# Patient Record
Sex: Female | Born: 1960
Health system: Southern US, Community
[De-identification: ages and names within clinical notes are randomized; demographics above are authoritative.]

## PROBLEM LIST (undated history)

## (undated) DIAGNOSIS — R5383 Other fatigue: Secondary | ICD-10-CM

## (undated) DIAGNOSIS — R002 Palpitations: Secondary | ICD-10-CM

## (undated) DIAGNOSIS — G4700 Insomnia, unspecified: Secondary | ICD-10-CM

## (undated) DIAGNOSIS — F99 Mental disorder, not otherwise specified: Secondary | ICD-10-CM

## (undated) DIAGNOSIS — K219 Gastro-esophageal reflux disease without esophagitis: Secondary | ICD-10-CM

## (undated) DIAGNOSIS — H8110 Benign paroxysmal vertigo, unspecified ear: Secondary | ICD-10-CM

## (undated) DIAGNOSIS — R609 Edema, unspecified: Secondary | ICD-10-CM

## (undated) DIAGNOSIS — I10 Essential (primary) hypertension: Secondary | ICD-10-CM

## (undated) DIAGNOSIS — R42 Dizziness and giddiness: Secondary | ICD-10-CM

## (undated) DIAGNOSIS — K449 Diaphragmatic hernia without obstruction or gangrene: Secondary | ICD-10-CM

## (undated) HISTORY — DX: Mental disorder, not otherwise specified: F99

## (undated) HISTORY — DX: Other fatigue: R53.83

## (undated) HISTORY — PX: BREAST EXCISIONAL BIOPSY: SUR124

## (undated) HISTORY — DX: Insomnia, unspecified: G47.00

## (undated) HISTORY — DX: Essential (primary) hypertension: I10

## (undated) HISTORY — DX: Edema, unspecified: R60.9

## (undated) HISTORY — DX: Benign paroxysmal vertigo, unspecified ear: H81.10

## (undated) HISTORY — DX: Gastro-esophageal reflux disease without esophagitis: K21.9

## (undated) HISTORY — DX: Dizziness and giddiness: R42

## (undated) HISTORY — DX: Diaphragmatic hernia without obstruction or gangrene: K44.9

## (undated) HISTORY — DX: Palpitations: R00.2

## (undated) HISTORY — PX: BREAST BIOPSY: SHX20

## (undated) HISTORY — PX: ESOPHAGOGASTRODUODENOSCOPY: SHX1529

---

## 1985-01-19 HISTORY — PX: CHOLECYSTECTOMY: SHX55

## 1998-01-19 HISTORY — PX: BREAST EXCISIONAL BIOPSY: SUR124

## 1998-01-19 HISTORY — PX: BREAST LUMPECTOMY: SHX2

## 2006-09-20 LAB — CONVERTED CEMR LAB

## 2006-09-30 HISTORY — PX: ENDOMETRIAL ABLATION: SHX621

## 2006-12-09 ENCOUNTER — Ambulatory Visit: Payer: Self-pay | Admitting: Family Medicine

## 2006-12-09 DIAGNOSIS — E669 Obesity, unspecified: Secondary | ICD-10-CM | POA: Insufficient documentation

## 2006-12-09 DIAGNOSIS — R42 Dizziness and giddiness: Secondary | ICD-10-CM | POA: Insufficient documentation

## 2006-12-09 DIAGNOSIS — D509 Iron deficiency anemia, unspecified: Secondary | ICD-10-CM | POA: Insufficient documentation

## 2006-12-09 DIAGNOSIS — R002 Palpitations: Secondary | ICD-10-CM | POA: Insufficient documentation

## 2006-12-09 DIAGNOSIS — G43909 Migraine, unspecified, not intractable, without status migrainosus: Secondary | ICD-10-CM | POA: Insufficient documentation

## 2006-12-20 ENCOUNTER — Encounter: Payer: Self-pay | Admitting: Family Medicine

## 2006-12-21 LAB — CONVERTED CEMR LAB
AST: 21 units/L (ref 0–37)
Albumin: 4.5 g/dL (ref 3.5–5.2)
BUN: 9 mg/dL (ref 6–23)
Basophils Relative: 0 % (ref 0–1)
CO2: 20 meq/L (ref 19–32)
Creatinine, Ser: 0.68 mg/dL (ref 0.40–1.20)
Eosinophils Absolute: 0.2 10*3/uL (ref 0.2–0.7)
Eosinophils Relative: 2 % (ref 0–5)
Free T4: 1.1 ng/dL (ref 0.89–1.80)
Glucose, Bld: 97 mg/dL (ref 70–99)
Hemoglobin: 13.5 g/dL (ref 12.0–15.0)
Iron: 49 ug/dL (ref 42–145)
Lymphocytes Relative: 22 % (ref 12–46)
Lymphs Abs: 2.3 10*3/uL (ref 0.7–4.0)
Monocytes Absolute: 0.8 10*3/uL (ref 0.1–1.0)
Monocytes Relative: 8 % (ref 3–12)
Neutro Abs: 7.1 10*3/uL (ref 1.7–7.7)
Neutrophils Relative %: 68 % (ref 43–77)
Potassium: 4.3 meq/L (ref 3.5–5.3)
RDW: 14.3 % (ref 11.5–15.5)
Sed Rate: 16 mm/hr (ref 0–22)
TIBC: 263 ug/dL (ref 250–470)
Total Protein: 7.4 g/dL (ref 6.0–8.3)

## 2007-01-25 ENCOUNTER — Telehealth: Payer: Self-pay | Admitting: Family Medicine

## 2007-04-13 ENCOUNTER — Ambulatory Visit: Payer: Self-pay | Admitting: Family Medicine

## 2007-04-13 DIAGNOSIS — R5383 Other fatigue: Secondary | ICD-10-CM

## 2007-04-13 DIAGNOSIS — R5381 Other malaise: Secondary | ICD-10-CM | POA: Insufficient documentation

## 2007-04-14 ENCOUNTER — Encounter: Payer: Self-pay | Admitting: Family Medicine

## 2007-04-15 LAB — CONVERTED CEMR LAB
Saturation Ratios: 17 % — ABNORMAL LOW (ref 20–55)
TIBC: 279 ug/dL (ref 250–470)
UIBC: 232 ug/dL
Vit D, 1,25-Dihydroxy: 15 — ABNORMAL LOW (ref 30–89)

## 2007-05-10 ENCOUNTER — Telehealth: Payer: Self-pay | Admitting: Family Medicine

## 2007-10-12 ENCOUNTER — Ambulatory Visit: Payer: Self-pay | Admitting: Occupational Medicine

## 2007-11-04 ENCOUNTER — Ambulatory Visit: Payer: Self-pay | Admitting: Occupational Medicine

## 2007-11-04 ENCOUNTER — Ambulatory Visit: Payer: Self-pay | Admitting: Family Medicine

## 2007-11-08 ENCOUNTER — Encounter (INDEPENDENT_AMBULATORY_CARE_PROVIDER_SITE_OTHER): Payer: Self-pay | Admitting: Occupational Medicine

## 2007-11-24 ENCOUNTER — Encounter: Admission: RE | Admit: 2007-11-24 | Discharge: 2007-11-24 | Payer: Self-pay | Admitting: Sports Medicine

## 2008-01-28 ENCOUNTER — Encounter: Payer: Self-pay | Admitting: Family Medicine

## 2008-02-13 ENCOUNTER — Ambulatory Visit: Payer: Self-pay | Admitting: Family Medicine

## 2008-02-13 LAB — CONVERTED CEMR LAB
Protein, U semiquant: 30
Specific Gravity, Urine: >=1.03

## 2008-02-14 ENCOUNTER — Encounter: Payer: Self-pay | Admitting: Family Medicine

## 2008-02-14 ENCOUNTER — Telehealth: Payer: Self-pay | Admitting: Family Medicine

## 2008-02-15 LAB — CONVERTED CEMR LAB
Iron: 30 ug/dL — ABNORMAL LOW (ref 42–145)
TSH: 2.597 microintl units/mL (ref 0.350–4.50)
UIBC: 243 ug/dL

## 2008-02-22 ENCOUNTER — Encounter: Payer: Self-pay | Admitting: Family Medicine

## 2008-02-27 ENCOUNTER — Encounter: Payer: Self-pay | Admitting: Family Medicine

## 2008-02-29 LAB — CONVERTED CEMR LAB
Alkaline Phosphatase: 71 units/L (ref 39–117)
Basophils Absolute: 0 10*3/uL (ref 0.0–0.1)
Eosinophils Relative: 2 % (ref 0–5)
MCHC: 33.6 g/dL (ref 30.0–36.0)
Monocytes Absolute: 0.6 10*3/uL (ref 0.1–1.0)
Monocytes Relative: 7 % (ref 3–12)
Neutro Abs: 6.2 10*3/uL (ref 1.7–7.7)
Neutrophils Relative %: 69 % (ref 43–77)
Platelets: 389 10*3/uL (ref 150–400)
RBC: 4.54 M/uL (ref 3.87–5.11)
RDW: 13.9 % (ref 11.5–15.5)

## 2008-03-09 ENCOUNTER — Encounter: Payer: Self-pay | Admitting: Family Medicine

## 2008-03-15 ENCOUNTER — Encounter: Payer: Self-pay | Admitting: Family Medicine

## 2008-04-02 ENCOUNTER — Encounter: Payer: Self-pay | Admitting: Family Medicine

## 2008-04-16 ENCOUNTER — Encounter: Payer: Self-pay | Admitting: Family Medicine

## 2008-04-23 ENCOUNTER — Encounter: Payer: Self-pay | Admitting: Family Medicine

## 2008-04-23 DIAGNOSIS — K449 Diaphragmatic hernia without obstruction or gangrene: Secondary | ICD-10-CM | POA: Insufficient documentation

## 2008-07-27 ENCOUNTER — Encounter: Payer: Self-pay | Admitting: Family Medicine

## 2008-09-27 HISTORY — PX: TOTAL VAGINAL HYSTERECTOMY: SHX2548

## 2009-03-11 ENCOUNTER — Ambulatory Visit: Payer: Self-pay | Admitting: Family Medicine

## 2009-03-11 DIAGNOSIS — I10 Essential (primary) hypertension: Secondary | ICD-10-CM | POA: Insufficient documentation

## 2009-03-11 DIAGNOSIS — G4709 Other insomnia: Secondary | ICD-10-CM | POA: Insufficient documentation

## 2009-03-11 DIAGNOSIS — G4700 Insomnia, unspecified: Secondary | ICD-10-CM

## 2009-03-11 LAB — CONVERTED CEMR LAB
Cholesterol, target level: 200 mg/dL
LDL Goal: 130 mg/dL

## 2009-03-15 ENCOUNTER — Ambulatory Visit: Payer: Self-pay | Admitting: Family Medicine

## 2009-03-18 ENCOUNTER — Ambulatory Visit: Payer: Self-pay | Admitting: Family Medicine

## 2009-06-11 ENCOUNTER — Encounter: Payer: Self-pay | Admitting: Family Medicine

## 2009-10-17 ENCOUNTER — Encounter: Payer: Self-pay | Admitting: Family Medicine

## 2009-10-31 ENCOUNTER — Encounter: Payer: Self-pay | Admitting: Family Medicine

## 2010-01-15 ENCOUNTER — Ambulatory Visit
Admission: RE | Admit: 2010-01-15 | Discharge: 2010-01-15 | Payer: Self-pay | Source: Home / Self Care | Attending: Family Medicine | Admitting: Family Medicine

## 2010-01-15 ENCOUNTER — Encounter: Payer: Self-pay | Admitting: Family Medicine

## 2010-01-15 DIAGNOSIS — H8110 Benign paroxysmal vertigo, unspecified ear: Secondary | ICD-10-CM | POA: Insufficient documentation

## 2010-01-16 ENCOUNTER — Encounter: Payer: Self-pay | Admitting: Family Medicine

## 2010-01-17 ENCOUNTER — Encounter: Payer: Self-pay | Admitting: Family Medicine

## 2010-02-18 NOTE — Assessment & Plan Note (Signed)
Summary: BP CHECK/  Nurse Visit   Vital Signs:  Patient profile:   50 year old female Pulse rate:   70 / minute BP sitting:   134 / 79  (left arm) Cuff size:   large  Vitals Entered By: Kathlene November (March 18, 2009 10:16 AM)   Impression & Recommendations:  Problem # 1:  HYPERTENSION, BENIGN (ICD-401.1)  Her updated medication list for this problem includes:    Lisinopril-hydrochlorothiazide 10-12.5 Mg Tabs (Lisinopril-hydrochlorothiazide) .Marland Kitchen... Take 1 tablet by mouth once a day in the am.  Complete Medication List: 1)  Aciphex 20 Mg Tbec (Rabeprazole sodium) .... Take 1 tablet by mouth two times a day 2)  Effexor Xr 75 Mg Cp24 (Venlafaxine hcl) .... Take 2  tablet by mouth once a day 3)  Maxalt 10 Mg Tabs (Rizatriptan benzoate) .... Use as needed for h/a 4)  Lisinopril-hydrochlorothiazide 10-12.5 Mg Tabs (Lisinopril-hydrochlorothiazide) .... Take 1 tablet by mouth once a day in the am. 5)  Vitamin D 84132 Unit Caps (Ergocalciferol) .... One a week by mouth for 6 months 6)  Tandem 162-115.2 Mg Caps (Ferrous fum-iron polysacch) .... Take 1 tablet by mouth two times a day. take with colace to help prevent constipation. 7)  Amoxicillin 500 Mg Cap (Amoxicillin) .... Take 1 capsule by mouth three times a day x 10 days 8)  Zolpidem Tartrate 10 Mg Tabs (Zolpidem tartrate) .... Take 1 tablet by mouth once a day at bedtime 9)  Vicodin 5-500 Mg Tabs (Hydrocodone-acetaminophen) .... Take one-two tablets by mouth as needed for pain 10)  Clindamycin Hcl 300 Mg Caps (Clindamycin hcl) .... One by mouth every 6 hours for 10 days.  CC: Follow-up HTN HPI: Taking Meds?yes Side Effects?no Chest Pain, SOB, Dizziness?no A/P: HTN(401.1) At Goal? yes.  If no, MD will be notified. Should be OK for tooth extractions today.   5 minutes was spent with the pt.   Allergies: No Known Drug Allergies  Orders Added: 1)  Est. Patient Level I [44010]

## 2010-02-18 NOTE — Assessment & Plan Note (Signed)
Summary: HTN, sinusitis, insomnia   Vital Signs:  Patient profile:   50 year old female Height:      62 inches Weight:      202 pounds BMI:     37.08 Pulse rate:   73 / minute BP sitting:   141 / 90  (left arm) Cuff size:   large  Vitals Entered By: Kathlene November (March 11, 2009 10:12 AM) CC: followup BP- also has insomnia sleeps 1-2 hours a night- sinus infection, Lipid Management, Hypertension Management   Primary Care Provider:  Nani Gasser MD  CC:  followup BP- also has insomnia sleeps 1-2 hours a night- sinus infection, Lipid Management, and Hypertension Management.  History of Present Illness: followup BP- also has insomnia sleeps 1-2 hours a night- sinus infection.    Right ear is echoing adn having sinus pressure behind both eyes.  Lotsof congestion.  Making her migrianes worse. FEver Thurs and friday. Having night sweats. Started about 5 days ago.  No ST.  Tried some Advil Sinus- no relief.  Now getting light sensitivity. Pain in her ear actually kept her up one night.   Insomnia for about 2 mo. Tried several OTC products. Will on ly sleep 1-2 hours at a time. Hard time falling asleep and stayin asleep.  Thinks getting 3-4 hours total at night.  Has reduced her caffeine.  No major stressors. Ne time had tried [place] in the past- worked well. Does have migraine and has had to use her  all of her tryptas.   Hypertension History:      She denies headache, chest pain, palpitations, dyspnea with exertion, orthopnea, PND, peripheral edema, visual symptoms, neurologic problems, syncope, and side effects from treatment.  She notes no problems with any antihypertensive medication side effects.        Positive major cardiovascular risk factors include hypertension and family history for ischemic heart disease (females less than 4 years old).  Negative major cardiovascular risk factors include female age less than 91 years old and non-tobacco-user status.    Lipid Management  History:      Positive NCEP/ATP III risk factors include family history for ischemic heart disease (females less than 47 years old) and hypertension.  Negative NCEP/ATP III risk factors include female age less than 75 years old and non-tobacco-user status.     Current Medications (verified): 1)  Aciphex 20 Mg Tbec (Rabeprazole Sodium) .... Take 1 Tablet By Mouth Two Times A Day 2)  Effexor Xr 75 Mg  Cp24 (Venlafaxine Hcl) .... Take 2  Tablet By Mouth Once A Day 3)  Maxalt 10 Mg  Tabs (Rizatriptan Benzoate) .... Use As Needed For H/a 4)  Hydrochlorothiazide 12.5 Mg  Caps (Hydrochlorothiazide) .... Take 1 Tablet By Mouth Once A Day 5)  Vitamin D 10272 Unit  Caps (Ergocalciferol) .... One A Week By Mouth For 6 Months 6)  Tandem 162-115.2 Mg  Caps (Ferrous Fum-Iron Polysacch) .... Take 1 Tablet By Mouth Two Times A Day. Take With Colace To Help Prevent Constipation.  Allergies (verified): No Known Drug Allergies  Comments:  Nurse/Medical Assistant: The patient's medications and allergies were reviewed with the patient and were updated in the Medication and Allergy Lists. Kathlene November (March 11, 2009 10:13 AM)  Social History: Reviewed history from 12/09/2006 and no changes required. 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 Completed 10th grade. Married to [place] with 2 adult children.  Never Smoked Alcohol use-no Drug use-no Regular exercise-no  Physical Exam  General:  Well-developed,well-nourished,in no acute distress; alert,appropriate and cooperative throughout examination Head:  Normocephalic and atraumatic without obvious abnormalities. No apparent alopecia or balding. Eyes:  No corneal or conjunctival inflammation noted. EOMI. Perrla.  Ears:  External ear exam shows no significant lesions or deformities.  Otoscopic examination reveals clear canals, tympanic membranes are intact bilaterally without bulging, retraction, inflammation or discharge. Hearing is grossly normal bilaterally. Nose:   External nasal examination shows no deformity or inflammation. Nasal mucosa are pink and moist without lesions or exudates. Mouth:  OP is mildy ertythematou.  Neck:  No deformities, masses, or tenderness noted. firm right anterior LN about 1 cm.   Lungs:  Normal respiratory effort, chest expands symmetrically. Lungs are clear to auscultation, no crackles or wheezes. Heart:  Normal rate and regular rhythm. S1 and S2 normal without gallop, murmur, click, rub or other extra sounds. Pulses:  Radial 2+  Neurologic:  alert & oriented X3.   Skin:  no rashes.   Cervical Nodes:  No lymphadenopathy noted Psych:  Cognition and judgment appear intact. Alert and cooperative with normal attention span and concentration. No apparent delusions, illusions, hallucinations   Impression & Recommendations:  Problem # 1:  HYPERTENSION, BENIGN (ICD-401.1) Assessment Deteriorated nOT WELL CONTROLLED.  Add ACEi to her regimen. Check BMET to check potassium and C at F/u OVr.  Make sure well controll.d Her updated medication list for this problem includes:    Lisinopril-hydrochlorothiazide 10-12.5 Mg Tabs (Lisinopril-hydrochlorothiazide) .Marland Kitchen... Take 1 tablet by mouth once a day in the am.  Problem # 2:  SINUSITIS, ACUTE (ICD-461.9) Assessment: New  The following medications were removed from the medication list:    Flonase 50 Mcg/act Susp (Fluticasone propionate) .Marland Kitchen... 2 sprays in each nostril daily Her updated medication list for this problem includes:    Amoxicillin 500 Mg Cap (Amoxicillin) .Marland Kitchen... Take 1 capsule by mouth three times a day x 10 days  Instructed on treatment. Call if symptoms persist or worsen.   Problem # 3:  INSOMNIA (ICD-780.52) Assessment: New  Reviewed sleep hygiene. Did well on Ambien in the past so will retry. Discussed goal is to use short term to reset her sleep clock and to not use chroncially. This is clearly affected the frequency of her migraines. Call if any SE or problems.     Her updated medication list for this problem includes:    Zolpidem Tartrate 10 Mg Tabs (Zolpidem tartrate) .Marland Kitchen... Take 1 tablet by mouth once a day at bedtime  Complete Medication List: 1)  Aciphex 20 Mg Tbec (Rabeprazole sodium) .... Take 1 tablet by mouth two times a day 2)  Effexor Xr 75 Mg Cp24 (Venlafaxine hcl) .... Take 2  tablet by mouth once a day 3)  Maxalt 10 Mg Tabs (Rizatriptan benzoate) .... Use as needed for h/a 4)  Lisinopril-hydrochlorothiazide 10-12.5 Mg Tabs (Lisinopril-hydrochlorothiazide) .... Take 1 tablet by mouth once a day in the am. 5)  Vitamin D 32992 Unit Caps (Ergocalciferol) .... One a week by mouth for 6 months 6)  Tandem 162-115.2 Mg Caps (Ferrous fum-iron polysacch) .... Take 1 tablet by mouth two times a day. take with colace to help prevent constipation. 7)  Amoxicillin 500 Mg Cap (Amoxicillin) .... Take 1 capsule by mouth three times a day x 10 days 8)  Zolpidem Tartrate 10 Mg Tabs (Zolpidem tartrate) .... Take 1 tablet by mouth once a day at bedtime  Hypertension Assessment/Plan:      The patient's hypertensive risk group is category B: At least  one risk factor (excluding diabetes) with no target organ damage.  Today's blood pressure is 141/90.  Her blood pressure goal is < 140/90.  Lipid Assessment/Plan:      Based on NCEP/ATP III, the patient's risk factor category is "2 or more risk factors and a calculated 10 year CAD risk of > 20%".  The patient's lipid goals are as follows: Total cholesterol goal is 200; LDL cholesterol goal is 130; HDL cholesterol goal is 40; Triglyceride goal is 150.      Patient Instructions: 1)  F/U in early to mid April to follow up for Blood pressure.  Prescriptions: TANDEM 162-115.2 MG  CAPS (FERROUS FUM-IRON POLYSACCH) Take 1 tablet by mouth two times a day. Take with colace to help prevent constipation.  #60 x 5   Entered and Authorized by:   Nani Gasser MD   Signed by:   Nani Gasser MD on 03/11/2009    Method used:   [facility] to        [facility] [phone]* (retail)       [address] [place], [place]  [address]       Ph: [phone] or [phone]       Fax: [phone]   RxID:   [id number] ACIPHEX 20 MG TBEC (RABEPRAZOLE SODIUM) Take 1 tablet by mouth two times a day  #30 x 5   Entered and Authorized by:   Nani Gasser MD   Signed by:   Nani Gasser MD on 03/11/2009   Method used:   [facility]ronically to        [facility] [phone]* (retail)       [address] [place], [place]  [address]       Ph: [phone] or [phone]       Fax: [phone]   RxID:   [id number] EFFEXOR XR 75 MG  CP24 (VENLAFAXINE HCL) Take 2  tablet by mouth once a day  #60 x 5   Entered and Authorized by:   Nani Gasser MD   Signed by:   Nani Gasser MD on 03/11/2009   Method used:   Electronically to        [facility] [phone]* (retail)       [address] [place], [place]  [address]       Ph: [phone] or [phone]       Fax: [phone]   RxID:   [id number] ZOLPIDEM TARTRATE 10 MG TABS (ZOLPIDEM TARTRATE) Take 1 tablet by mouth once a day at bedtime  #30 x 0   Entered and Authorized by:   Nani Gasser MD   Signed by:   Nani Gasser MD on 03/11/2009   Method used:   Printed then faxed to ...       [facility]  [place] [phone]* (retail)       [address] [place], [place]  [address]       Ph: [phone] or [phone]       Fax: [phone]   RxID:   [id number] LISINOPRIL-HYDROCHLOROTHIAZIDE 10-12.5 MG TABS (LISINOPRIL-HYDROCHLOROTHIAZIDE) Take 1 tablet by mouth once a day in the AM.  #30 x 1   Entered and Authorized by:   Nani Gasser MD   Signed by:   Nani Gasser MD on 03/11/2009   Method used:   Electronically  to        [facility]  [place] [phone]* (retail)       [address] [place], [place]  [address]       Ph: [phone] or [phone]       Fax: [phone]   RxID:    [id number] AMOXICILLIN 500 MG CAP (AMOXICILLIN) Take 1 capsule by mouth three times a day X 10 days  #30 x 0   Entered and Authorized by:   Nani Gasser MD   Signed by:   Nani Gasser MD on 03/11/2009   Method used:   Electronically to        [facility] [phone]* (retail)       [address] [place], [place]  [address]       Ph: [phone] or [phone]       Fax: [phone]   RxID:   [id number]

## 2010-02-18 NOTE — Assessment & Plan Note (Signed)
Summary: Tooth abscess, BP  Nurse Visit   Vital Signs:  Patient profile:   50 year old female Height:      62 inches Weight:      202 pounds Pulse rate:   76 / minute BP sitting:   162 / 97  (left arm) Cuff size:   large  Vitals Entered By: Kathlene November (March 15, 2009 10:40 AM)  Impression & Recommendations:  Problem # 1:  ABSCESS, TOOTH (ICD-522.5) Given toradol for acute pain. I think that her elevated BP is from pain, but will have her incrase her lisinopril to 2 tabs daily.Also given Rx for clindamycin so she can have the tooth extraction on Monday and also given meds for pain control. F/U on monday to recheck BP.   Complete Medication List: 1)  Aciphex 20 Mg Tbec (Rabeprazole sodium) .... Take 1 tablet by mouth two times a day 2)  Effexor Xr 75 Mg Cp24 (Venlafaxine hcl) .... Take 2  tablet by mouth once a day 3)  Maxalt 10 Mg Tabs (Rizatriptan benzoate) .... Use as needed for h/a 4)  Lisinopril-hydrochlorothiazide 10-12.5 Mg Tabs (Lisinopril-hydrochlorothiazide) .... Take 1 tablet by mouth once a day in the am. 5)  Vitamin D 14782 Unit Caps (Ergocalciferol) .... One a week by mouth for 6 months 6)  Tandem 162-115.2 Mg Caps (Ferrous fum-iron polysacch) .... Take 1 tablet by mouth two times a day. take with colace to help prevent constipation. 7)  Amoxicillin 500 Mg Cap (Amoxicillin) .... Take 1 capsule by mouth three times a day x 10 days 8)  Zolpidem Tartrate 10 Mg Tabs (Zolpidem tartrate) .... Take 1 tablet by mouth once a day at bedtime 9)  Vicodin 5-500 Mg Tabs (Hydrocodone-acetaminophen) .... Take one-two tablets by mouth as needed for pain 10)  Clindamycin Hcl 300 Mg Caps (Clindamycin hcl) .... One by mouth every 6 hours for 10 days.   Primary Care Provider:  Nani Gasser MD  CC:  BP check- at dentist this am for abcess tooth and BP was 193/110.Marland Kitchen  History of Present Illness: Pt walked in today.  BP at dentsit office was 193/110 so was told to come  immediately here. They were unable to do surgery for her tooth abscess.  Wasn't given ABX or pain medication.   CC: BP check- at dentist this am for abcess tooth and BP was 193/110.  CC: Follow-up HTN HPI: Taking Meds?yes Side Effects?no Chest Pain, SOB, Dizziness?no A/P: HTN(401.1) At Goal? If no, MD will be notified. Follow-up in--  5 minutes was spent with the pt.  Current Medications (verified): 1)  Aciphex 20 Mg Tbec (Rabeprazole Sodium) .... Take 1 Tablet By Mouth Two Times A Day 2)  Effexor Xr 75 Mg  Cp24 (Venlafaxine Hcl) .... Take 2  Tablet By Mouth Once A Day 3)  Maxalt 10 Mg  Tabs (Rizatriptan Benzoate) .... Use As Needed For H/a 4)  Lisinopril-Hydrochlorothiazide 10-12.5 Mg Tabs (Lisinopril-Hydrochlorothiazide) .... Take 1 Tablet By Mouth Once A Day in The Am. 5)  Vitamin D 95621 Unit  Caps (Ergocalciferol) .... One A Week By Mouth For 6 Months 6)  Tandem 162-115.2 Mg  Caps (Ferrous Fum-Iron Polysacch) .... Take 1 Tablet By Mouth Two Times A Day. Take With Colace To Help Prevent Constipation. 7)  Amoxicillin 500 Mg Cap (Amoxicillin) .... Take 1 Capsule By Mouth Three Times A Day X 10 Days 8)  Zolpidem Tartrate 10 Mg Tabs (Zolpidem Tartrate) .... Take 1 Tablet By Mouth Once A Day  At Bedtime  Allergies (verified): No Known Drug Allergies  Comments:  Nurse/Medical Assistant: The patient's medications and allergies were reviewed with the patient and were updated in the Medication and Allergy Lists. Kathlene November (March 15, 2009 10:41 AM)  Medication Administration  Injection # 1:    Medication: Ketorolac-Toradol 15mg     Route: IM    Site: RUOQ gluteus    Exp Date: 08/20/2010    Lot #: [id number]    Mfr: Novaplus    Comments: Toradol 60mg  given IM    Patient tolerated injection without complications    Given by: Kathlene November (March 15, 2009 10:45 AM)  Orders Added: 1)  Est. Patient Level II [16109] Prescriptions: CLINDAMYCIN HCL 300 MG CAPS (CLINDAMYCIN HCL)  one by mouth every 6 hours for 10 days.  #40 x 0   Entered and Authorized by:   Nani Gasser MD   Signed by:   Nani Gasser MD on 03/15/2009   Method used:   [facility]ronically to        [facility] [phone]* (retail)       [address] [place], [place]  [address]       Ph: [phone] or [phone]       Fax: [phone]   RxID:   [id number] VICODIN 5-500 MG TABS (HYDROCODONE-ACETAMINOPHEN) take one-two tablets by mouth as needed for pain  #20 x 0   Entered by:   Kathlene November   Authorized by:   Nani Gasser MD   Signed by:   Kathlene November on 03/15/2009   Method used:   Print then Give to Patient   RxID:   [id number]

## 2010-02-18 NOTE — Op Note (Signed)
Summary: Hysterectomy/[facility]  Hysterectomy/[facility]   Imported By: Lanelle Bal 03/14/2009 14:13:55  _____________________________________________________________________  External Attachment:    Type:   Image     Comment:   External Document

## 2010-02-18 NOTE — Letter (Signed)
Summary: Generic Letter  [facility] [place]  [address]   [place], [place] [address]   Phone: [phone]  Fax: [phone]    10/31/2009  Kristen Spence [address] [place], [place]  [address]  Dear [name],  We have received your mammogram results back and they are normal. Repeat mammogram in 1 year.         Sincerely,   Nani Gasser, MD

## 2010-02-18 NOTE — Medication Information (Signed)
Summary: Approval for Prilosec/[facility]  Approval for Prilosec/[facility]   Imported By: Lanelle Bal 06/20/2009 11:04:20  _____________________________________________________________________  External Attachment:    Type:   Image     Comment:   External Document

## 2010-02-19 ENCOUNTER — Ambulatory Visit: Payer: Self-pay | Admitting: Cardiology

## 2010-02-19 ENCOUNTER — Ambulatory Visit: Admit: 2010-02-19 | Payer: Self-pay | Admitting: Cardiology

## 2010-02-20 LAB — CONVERTED CEMR LAB
BUN: 7 mg/dL (ref 6–23)
CO2: 23 meq/L (ref 19–32)
Calcium: 9 mg/dL (ref 8.4–10.5)
Chloride: 105 meq/L (ref 96–112)
Creatinine, Ser: 0.62 mg/dL (ref 0.40–1.20)
Glucose, Bld: 85 mg/dL (ref 70–99)
Hemoglobin: 13.2 g/dL (ref 12.0–15.0)
Lymphocytes Relative: 26 % (ref 12–46)
Lymphs Abs: 2.5 10*3/uL (ref 0.7–4.0)
MCHC: 32.8 g/dL (ref 30.0–36.0)
Monocytes Absolute: 0.6 10*3/uL (ref 0.1–1.0)
Monocytes Relative: 6 % (ref 3–12)
Neutro Abs: 6.4 10*3/uL (ref 1.7–7.7)
RBC: 4.5 M/uL (ref 3.87–5.11)
TSH: 2.194 microintl units/mL (ref 0.350–4.500)

## 2010-02-20 NOTE — Assessment & Plan Note (Signed)
Summary: HA, elevated BP, palps   Vital Signs:  Patient profile:   50 year old female Height:      62 inches Weight:      213 pounds Pulse rate:   84 / minute BP sitting:   143 / 93  (right arm) Cuff size:   large  Vitals Entered By: Avon Gully CMA, Duncan Dull) (January 15, 2010 3:37 PM) CC: BP check   Primary Care Provider:  Nani Gasser MD  CC:  BP check.  History of Present Illness: BP has been high lately. Notices her head feels swimmen and notices if bends forwad the room spins. Has gained 11 lbs in the last 10 months.  Yesterday sitting at work and felt a flutter inher chest. then with walking heart started fluttering.  Lasted maybe 20 minutes.  After the flutter noticed some discomfort in the middle in her chest. Lasted a few minutes.  LE edema in the ankles and hands for hte last 4-5 days.   Current Medications (verified): 1)  Omeprazole 40 Mg Cpdr (Omeprazole) .... Take 1 Tablet By Mouth Once A Day 2)  Effexor Xr 75 Mg  Cp24 (Venlafaxine Hcl) .... Take 2  Tablet By Mouth Once A Day 3)  Maxalt 10 Mg  Tabs (Rizatriptan Benzoate) .... Use As Needed For H/a 4)  Lisinopril-Hydrochlorothiazide 10-12.5 Mg Tabs (Lisinopril-Hydrochlorothiazide) .... Take 1 Tablet By Mouth Once A Day in The Am. 5)  Vitamin D 04540 Unit  Caps (Ergocalciferol) .... One A Week By Mouth For 6 Months 6)  Zolpidem Tartrate 10 Mg Tabs (Zolpidem Tartrate) .... Take 1 Tablet By Mouth Once A Day At Bedtime 7)  Vicodin 5-500 Mg Tabs (Hydrocodone-Acetaminophen) .... Take One-Two Tablets By Mouth As Needed For Pain 8)  Clindamycin Hcl 300 Mg Caps (Clindamycin Hcl) .... One By Mouth Every 6 Hours For 10 Days.  Allergies (verified): No Known Drug Allergies  Comments:  Nurse/Medical Assistant: The patient's medications and allergies were reviewed with the patient and were updated in the Medication and Allergy Lists. Avon Gully CMA, Duncan Dull) (January 15, 2010 3:38 PM)  Past History:  Past  Medical History: Last updated: 04/23/2008 Current Problems:  MIGRAINE HEADACHE (ICD-346.90) ANEMIA, IRON DEFICIENCY (ICD-280.9) EDEMA (ICD-782.3)   EGD 03/2008 showed Grade D reflux esophagitis.    Past Surgical History: Last updated: 03/11/2009 Cholecystectomy 7/87 C/sec 1/82, 4/85 Lumpectomy Breast 2000 Endometrial Ablation 09/30/06 Complete hysterctomy  07/27/08 with Dr.Stacey Thad Ranger  Family History: Last updated: 12/09/2006 Father with alcoholism, stroke, HTN Mother with Ovarian Ca, depression, DM, hi chol, HTN, COPD Sister with MI, died age 69 Sister with BrCa  Social History: Reviewed history from 12/09/2006 and no changes required. 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 Married to [place] with 2 adult children.  Never Smoked Alcohol use-no Drug use-no Regular exercise-no  Physical Exam  General:  Well-developed,well-nourished,in no acute distress; alert,appropriate and cooperative throughout examination Head:  Normocephalic and atraumatic without obvious abnormalities. No apparent alopecia or balding. Eyes:  No corneal or conjunctival inflammation noted. EOMI. Perrla. Ears:  External ear exam shows no significant lesions or deformities.  Otoscopic examination reveals clear canals, tympanic membranes are intact bilaterally without bulging, retraction, inflammation or discharge. Hearing is grossly normal bilaterally. Nose:  External nasal examination shows no deformity or inflammation. Nasal mucosa are pink and moist without lesions or exudates. Mouth:  Oral mucosa and oropharynx without lesions or exudates.   Neck:  No deformities, masses, or tenderness noted. NO TM.  Lungs:  Normal respiratory effort, chest expands symmetrically. Lungs are clear to auscultation, no crackles or wheezes. Heart:  Normal rate and regular rhythm. S1 and S2 normal without gallop, murmur, click, rub or other extra sounds. No carotid bruits.  Pulses:  Radial 2+  Extremities:  TRace  ankle edema.  Neurologic:  + Diks-Hallpike to the right.  alert & oriented X3, cranial nerves II-XII intact, and gait normal.   Skin:  no rashes.   Cervical Nodes:  No lymphadenopathy noted Psych:  Cognition and judgment appear intact. Alert and cooperative with normal attention span and concentration. No apparent delusions, illusions, hallucinations   Impression & Recommendations:  Problem # 1:  HYPERTENSION, BENIGN (ICD-401.1) NOt at goal. Inc med. This may also help with her swelling. I think her weight gain has contributed as well.  Her updated medication list for this problem includes:    Lisinopril-hydrochlorothiazide 20-25 Mg Tabs (Lisinopril-hydrochlorothiazide) .Marland Kitchen... Take 1 tablet by mouth once a day  BP today: 143/93 Prior BP: 134/79 (03/18/2009)  Prior 10 Yr Risk Heart Disease: Not enough information (03/11/2009)  Labs Reviewed: K+: 4.3 (12/20/2006) Creat: : 0.68 (12/20/2006)     Problem # 2:  PALPITATIONS (ICD-785.1)  EKG today shows NSR. RAte 64 bpm. No acute changes Screening labs.  Will refer to Cardiology since has had palps before. Lets rule out any problems with her heart. This may just be stress and anxiety if her heart w/u is normal.  Will rule out anemia and thyroid d/o.   Orders: T-Comprehensive Metabolic Panel [phone]) T-TSH [id number]) T-CBC w/Diff [id number]) Cardiology Referral (Cardiology) EKG w/ Interpretation (93[address])  Problem # 3:  BENIGN POSITIONAL VERTIGO (ICD-386.11) Discussed based on hx and exam she has BPV. H.O. given and exericses reviewed. Discussed the etiology of this condition.  Follow up if not resolving in one month.   Complete Medication List: 1)  Omeprazole 40 Mg Cpdr (Omeprazole) .... Take 1 tablet by mouth once a day 2)  Effexor Xr 75 Mg Cp24 (Venlafaxine hcl) .... Take 2  tablet by mouth once a day 3)  Maxalt 10 Mg Tabs (Rizatriptan benzoate) .... Use as needed for h/a 4)  Lisinopril-hydrochlorothiazide 20-25 Mg Tabs  (Lisinopril-hydrochlorothiazide) .... Take 1 tablet by mouth once a day 5)  Vitamin D 72536 Unit Caps (Ergocalciferol) .... One a week by mouth for 6 months 6)  Zolpidem Tartrate 10 Mg Tabs (Zolpidem tartrate) .... Take 1 tablet by mouth once a day at bedtime 7)  Vicodin 5-500 Mg Tabs (Hydrocodone-acetaminophen) .... Take one-two tablets by mouth as needed for pain 8)  Clindamycin Hcl 300 Mg Caps (Clindamycin hcl) .... One by mouth every 6 hours for 10 days.  Patient Instructions: 1)  Please schedule a follow-up appointment in 1 month.  2)  Low salt diet and really watch calorie intake.  3)  You have Benign Positional Vertigo that is causing your dizziness. Please read the Handout and start doing the exercises to help resolve this more quickly. 4)  I am going to refer you to cardiology for palpitations.  Prescriptions: EFFEXOR XR 75 MG  CP24 (VENLAFAXINE HCL) Take 2  tablet by mouth once a day  #60 x 5   Entered and Authorized by:   Nani Gasser MD   Signed by:   Nani Gasser MD on 01/15/2010   Method used:   [facility] [address] [phone][facility]       [address]  [place], [place]  [address]       Ph: [phone] or [phone]       Fax: [phone]   RxID:   [id number] OMEPRAZOLE 40 MG CPDR (OMEPRAZOLE) Take 1 tablet by mouth once a day  #90 x 1   Entered and Authorized by:   Nani Gasser MD   Signed by:   Nani Gasser MD on 01/15/2010   Method used:   [facility] [phone][facility] (retail)       [address]       [place], [place]  [address]       Ph: [phone] or [phone]       Fax: [phone]   RxID:   [id number] LISINOPRIL-HYDROCHLOROTHIAZIDE 20-25 MG TABS (LISINOPRIL-HYDROCHLOROTHIAZIDE) Take 1 tablet by mouth once a day  #30 x 1   Entered and Authorized by:   Nani Gasser MD   Signed by:   Nani Gasser MD on 01/15/2010   Method used:   [facility] [phone][facility] (retail[facility]       [address]       [place], [place]  [address]       Ph: [phone] or [phone]       Fax: [phone]   RxID:   [id number]    Orders Added: 1)  T-Comprehensive Metabolic Panel [[id number]] 2)  T-TSH [[id number]] 3)  T-CBC w/Diff [[id number]] 4)  Cardiology Referral [Cardiology] 5)  Est. Patient Level IV [[address]] 6)  EKG w/ Interpretation [93000]  Appended Document: HA, elevated BP, palps NO SOB or GI sxs recenlty., She has had palps in the past they resolved on their own. She admist has been more stressed lately. Her husband was dx with cancer and she has to take on alot.

## 2010-02-20 NOTE — Letter (Signed)
Summary: Generic Letter  [facility]  [address], [address]   [place], [place] [address]   Phone: [phone]  Fax: [phone]    01/16/2010  Sydnee Tancredi [address] [place], [place]  [address]  Dear Ms. Urbanek,    We have scheduled you an appointment with a cardiologists. Appointment is with Dr. Jens Som with [facility] in [place] on 02/19/2010 @ 3:45pm.  They are located in the building Dr. Linford Arnold is in- just downstairs on the first floor with the [facility]. Their phone number is you need it is [phone].       Sincerely,   Nani Gasser, MD

## 2010-02-20 NOTE — Letter (Signed)
Summary: Generic Letter  [facility]  [address], [address]   [place], [place] [address]   Phone: [phone]  Fax: [phone]    01/17/2010  Kristen Spence [address] [place], [place]  [address]  Dear Ms. Chartrand,    Her blood count is normal.  No sign of infection or anemia.  Her sodium potassium and chloride are normal.  Kidney function and liver function are normal her thyroid level looks great.There is nothing here to explain her elevation of blood pressure.  I do think her weight gain has contributed by also and make sure that we work up her palpitations.  We should be calling her soon with a cardiology referral.December 29, 20117:45 AM Linford Arnold MD, Santina Evans        Sincerely,   Nani Gasser, MD

## 2010-03-11 ENCOUNTER — Telehealth: Payer: Self-pay | Admitting: Family Medicine

## 2010-03-18 ENCOUNTER — Ambulatory Visit (INDEPENDENT_AMBULATORY_CARE_PROVIDER_SITE_OTHER): Payer: Private Health Insurance - Indemnity | Admitting: Family Medicine

## 2010-03-18 ENCOUNTER — Encounter: Payer: Self-pay | Admitting: Family Medicine

## 2010-03-18 DIAGNOSIS — I10 Essential (primary) hypertension: Secondary | ICD-10-CM

## 2010-03-18 DIAGNOSIS — J019 Acute sinusitis, unspecified: Secondary | ICD-10-CM

## 2010-03-18 DIAGNOSIS — R300 Dysuria: Secondary | ICD-10-CM

## 2010-03-18 LAB — CONVERTED CEMR LAB
Glucose, Urine, Semiquant: NEGATIVE
Nitrite: NEGATIVE
Protein, U semiquant: NEGATIVE
Urobilinogen, UA: 0.2

## 2010-03-18 NOTE — Progress Notes (Signed)
  Phone Note Refill Request Message from:  Fax from Pharmacy on March 11, 2010 11:42 AM  Refills Requested: Medication #1:  LISINOPRIL-HYDROCHLOROTHIAZIDE 20-25 MG TABS Take 1 tablet by mouth once a day needs f/u appt  Initial call taken by: Avon Gully CMA, Duncan Dull),  March 11, 2010 11:42 AM    Prescriptions: LISINOPRIL-HYDROCHLOROTHIAZIDE 20-25 MG TABS (LISINOPRIL-HYDROCHLOROTHIAZIDE) Take 1 tablet by mouth once a day  #30 x 1   Entered by:   Avon Gully CMA, (AAMA)   Authorized by:   Nani Gasser MD   Signed by:   Avon Gully CMA, (AAMA) on 03/11/2010   Method used:   [facility]        [facility] [phone]* (retail)       [address] [place], [place]  [address]       Ph: [phone] or [phone]       Fax: [phone]   RxID:   [id number]

## 2010-03-19 ENCOUNTER — Encounter: Payer: Self-pay | Admitting: Family Medicine

## 2010-03-19 LAB — CONVERTED CEMR LAB

## 2010-03-26 ENCOUNTER — Encounter (INDEPENDENT_AMBULATORY_CARE_PROVIDER_SITE_OTHER): Payer: Self-pay | Admitting: *Deleted

## 2010-03-27 NOTE — Assessment & Plan Note (Signed)
Summary: Sinus, hematuria, HTN   Vital Signs:  Patient profile:   50 year old female Height:      62 inches Weight:      214 pounds Temp:     97.8 degrees F oral Pulse rate:   88 / minute BP sitting:   136 / 107  (right arm) Cuff size:   large  Vitals Entered By: Avon Gully CMA, (AAMA) (March 18, 2010 8:45 AM) CC: sinus pressure,HA and congestion x 2 weeks,BP check pt didnt take meds this am   Serial Vital Signs/Assessments:  Time      Position  BP       Pulse  Resp  Temp     By 8:45 AM             154/96                         Avon Gully CMA, Duncan Dull)   Primary Care Provider:  Nani Gasser MD  CC:  sinus pressure, HA and congestion x 2 weeks, and BP check pt didnt take meds this am.  History of Present Illness: Bilateral sinus pain and pressure. Started with fever.  No ST.  Does have hx of allergies. HA too.  No GI sxs. Took several OTC medications. Would like 90 days rx for claritin. Having diarhea as well. Left ear is sore.   She has also noticed a smell ot her urine. No fever or dysuria or hematuria.  Would like [place] to check her Urine.   Current Medications (verified): 1)  Omeprazole 40 Mg Cpdr (Omeprazole) .... Take 1 Tablet By Mouth Once A Day 2)  Effexor Xr 75 Mg  Cp24 (Venlafaxine Hcl) .... Take 2  Tablet By Mouth Once A Day 3)  Maxalt 10 Mg  Tabs (Rizatriptan Benzoate) .... Use As Needed For H/a 4)  Lisinopril-Hydrochlorothiazide 20-25 Mg Tabs (Lisinopril-Hydrochlorothiazide) .... Take 1 Tablet By Mouth Once A Day 5)  Vitamin D [address] Unit  Caps (Ergocalciferol) .... One A Week By Mouth For 6 Months 6)  Zolpidem Tartrate 10 Mg Tabs (Zolpidem Tartrate) .... Take 1 Tablet By Mouth Once A Day At Bedtime  Allergies (verified): No Known Drug Allergies  Social History: Reviewed history from 12/09/2006 and no changes required. 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  Never Smoked Alcohol  use-no Drug use-no Regular exercise-no  Physical Exam  General:  Well-developed,well-nourished,in no acute distress; alert,appropriate and cooperative throughout examination Head:  Normocephalic and atraumatic without obvious abnormalities. No apparent alopecia or balding. Eyes:  No corneal or conjunctival inflammation noted. EOMI. Perrla. Ears:  External ear exam shows no significant lesions or deformities.  Otoscopic examination reveals clear canals, tympanic membranes are intact bilaterally without bulging, retraction, inflammation or discharge. Hearing is grossly normal bilaterally. Nose:  External nasal examination shows no deformity or inflammation.  Mouth:  Oral mucosa and oropharynx without lesions or exudates.  Teeth in good repair. Neck:  No deformities, masses, or tenderness noted. Lungs:  Normal respiratory effort, chest expands symmetrically. Lungs are clear to auscultation, no crackles or wheezes. Heart:  Normal rate and regular rhythm. S1 and S2 normal without gallop, murmur, click, rub or other extra sounds. Skin:  no rashes.   Cervical Nodes:  No lymphadenopathy noted Psych:  Cognition and judgment appear intact. Alert and cooperative with normal attention span and concentration. No apparent delusions, illusions, hallucinations   Impression &  Recommendations:  Problem # 1:  SINUSITIS - ACUTE-NOS (ICD-461.9) Assessment New Will tx with abx since sxs for 2 weeks. Also recommend nasal saline and restart oral antihistamine. Rx sent to pharm. Call if not better in 2 weeks.  The following medications were removed from the medication list:    Clindamycin Hcl 300 Mg Caps (Clindamycin hcl) ..... One by mouth every 6 hours for 10 days. Her updated medication list for this problem includes:    Bactrim Ds 800-160 Mg Tabs (Sulfamethoxazole-trimethoprim) .Marland Kitchen... Take 1 tablet by mouth two times a day for 10 days  Instructed on treatment. Call if symptoms persist or worsen.   Problem  # 2:  DYSURIA (ICD-788.1) Assessment: New  + for blood. Will send a culture.  The following medications were removed from the medication list:    Clindamycin Hcl 300 Mg Caps (Clindamycin hcl) ..... One by mouth every 6 hours for 10 days. Her updated medication list for this problem includes:    Bactrim Ds 800-160 Mg Tabs (Sulfamethoxazole-trimethoprim) .Marland Kitchen... Take 1 tablet by mouth two times a day for 10 days  Orders: UA Dipstick w/o Micro (automated)  (81003) T-Urine Culture (Spectrum Order) [id number]) T-Urine Microscopic [id number])  Problem # 3:  HYPERTENSION, BENIGN (ICD-401.1) F/U for recheck BP in one month once off cold meds. Plus she didn't take her HTN meds this AM.  Her updated medication list for this problem includes:    Lisinopril-hydrochlorothiazide 20-25 Mg Tabs (Lisinopril-hydrochlorothiazide) .Marland Kitchen... Take 1 tablet by mouth once a day  Complete Medication List: 1)  Omeprazole 40 Mg Cpdr (Omeprazole) .... Take 1 tablet by mouth once a day 2)  Effexor Xr 75 Mg Cp24 (Venlafaxine hcl) .... Take 2  tablet by mouth once a day 3)  Maxalt 10 Mg Tabs (Rizatriptan benzoate) .... Use as needed for h/a 4)  Lisinopril-hydrochlorothiazide 20-25 Mg Tabs (Lisinopril-hydrochlorothiazide) .... Take 1 tablet by mouth once a day 5)  Vitamin D 88416 Unit Caps (Ergocalciferol) .... One a week by mouth for 6 months 6)  Zolpidem Tartrate 10 Mg Tabs (Zolpidem tartrate) .... Take 1 tablet by mouth once a day at bedtime 7)  Valacyclovir Hcl 1 Gm Tabs (Valacyclovir hcl) .... 2 tabs by mouth two times a day for one day. (4 total for one day). 8)  Bactrim Ds 800-160 Mg Tabs (Sulfamethoxazole-trimethoprim) .... Take 1 tablet by mouth two times a day for 10 days 9)  Loratadine 10 Mg Tabs (Loratadine) .... Take 1 tablet by mouth once a day  Patient Instructions: 1)  Nasal saline rinse two times a day  2)  REstart your allergy medications.  3)  Complete the antibiotic.  4)  Follow up in 2-3 weeks  for blood pressure check.  Prescriptions: EFFEXOR XR 75 MG  CP24 (VENLAFAXINE HCL) Take 2  tablet by mouth once a day  #180 x 1   Entered and Authorized by:   Nani Gasser MD   Signed by:   Nani Gasser MD on 03/18/2010   Method used:   [facility]        [facility] [phone]* (retail)       [address]       [place], [place]  [address]       Ph: [phone] or [phone]       Fax: [phone]   RxID:   [id number] VALACYCLOVIR HCL 1 GM TABS (VALACYCLOVIR HCL) 2 tabs by mouth two times a day for one day. (4 total for one  day).  #12 x 2   Entered and Authorized by:   Nani Gasser MD   Signed by:   Nani Gasser MD on 03/18/2010   Method used:   [facility]        [facility] [phone]* (retail)       [address]       [place], [place]  [address]       Ph: [phone] or [phone]       Fax: [phone]   RxID:   [id number] LISINOPRIL-HYDROCHLOROTHIAZIDE 20-25 MG TABS (LISINOPRIL-HYDROCHLOROTHIAZIDE) Take 1 tablet by mouth once a day  #90 x 1   Entered and Authorized by:   Nani Gasser MD   Signed by:   Nani Gasser MD on 03/18/2010   Method used:   [facility] to        [facility] [address] [facility] [address] [phone]* (retail)       [address]       [place], [place]  [address]       Ph: [phone] or [phone]       Fax: [phone]   RxID:   [id number] LORATADINE 10 MG TABS (LORATADINE) Take 1 tablet by mouth once a day  #30 x 2   Entered and Authorized by:   Nani Gasser MD   Signed by:   Nani Gasser MD on 03/18/2010   Method used:   [facility]ronically to        [facility] [address] [phone]* (retail)       [address]       [place], [place]  [address]       Ph: [phone] or [phone]       Fax: [phone]   RxID:   [id number] BACTRIM DS 800-160 MG TABS (SULFAMETHOXAZOLE-TRIMETHOPRIM) Take 1 tablet by mouth two times a day for 10 days  #20 x 0   Entered and Authorized by:   Nani Gasser  MD   Signed by:   Nani Gasser MD on 03/18/2010   Method used:   [facility]ically to        [facility] [address] [facility] [address] [phone]* (retail)       [address]       [place], [place]  [address]       Ph: [phone] or [phone]       Fax: [phone]   RxID:   [id number]    Orders Added: 1)  UA Dipstick w/o Micro (automated)  [81003] 2)  T-Urine Culture (Spectrum Order) [[id number]] 3)  T-Urine Microscopic [[id number]] 4)  Est. Patient Level IV [[address]]    Laboratory Results   Urine Tests  Date/Time Received: 03/18/10 Date/Time Reported: 03/18/10  Routine Urinalysis   Color: yellow Appearance: Clear Glucose: negative   (Normal Range: Negative) Bilirubin: negative   (Normal Range: Negative) Ketone: negative   (Normal Range: Negative) Spec. Gravity: 1.025   (Normal Range: 1.003-1.035) Blood: moderate   (Normal Range: Negative) pH: 6.0   (Normal Range: 5.0-8.0) Protein: negative   (Normal Range: Negative) Urobilinogen: 0.2   (Normal Range: 0-1) Nitrite: negative   (Normal Range: Negative) Leukocyte Esterace: trace   (Normal Range: Negative)

## 2010-03-28 ENCOUNTER — Telehealth: Payer: Self-pay | Admitting: Family Medicine

## 2010-04-01 NOTE — Letter (Signed)
Summary: Generic Letter  [facility]  [address], [address]   [place], [place] [address]   Phone: [phone]  Fax: [phone]    03/26/2010  Kristen Spence [address] [place], [place]  [address]  [place]  Dear [name],   We have been unable to contact you by phone.Please contact our office with your updated contact information.Your urine culture had bacteria in it and Dr. Linford Arnold is sending an antibiotic to your pharmacy.        Sincerely,   Avon Gully CMA, (AAMA)

## 2010-04-01 NOTE — Progress Notes (Signed)
  Phone Note Refill Request Message from:  Fax from Pharmacy on March 28, 2010 5:32 PM  Refills Requested: Medication #1:  LISINOPRIL-HYDROCHLOROTHIAZIDE 20-25 MG TABS Take 1 tablet by mouth once a day Initial call taken by: Avon Gully CMA, Duncan Dull),  March 28, 2010 5:32 PM    Prescriptions: LISINOPRIL-HYDROCHLOROTHIAZIDE 20-25 MG TABS (LISINOPRIL-HYDROCHLOROTHIAZIDE) Take 1 tablet by mouth once a day  #90 x 1   Entered by:   Avon Gully CMA, (AAMA)   Authorized by:   Nani Gasser MD   Signed by:   Avon Gully CMA, (AAMA) on 03/28/2010   Method used:   Electron[facility] to        [facility] [phone]* (retail)       [address] [place], [place]  [address]       Ph: [phone] or [phone]       Fax: [phone]   RxID:   [id number]

## 2010-06-12 ENCOUNTER — Ambulatory Visit (INDEPENDENT_AMBULATORY_CARE_PROVIDER_SITE_OTHER): Payer: Private Health Insurance - Indemnity | Admitting: Family Medicine

## 2010-06-12 ENCOUNTER — Encounter: Payer: Self-pay | Admitting: Family Medicine

## 2010-06-12 VITALS — BP 136/90 | HR 70 | Ht 62.0 in | Wt 215.0 lb

## 2010-06-12 DIAGNOSIS — R5381 Other malaise: Secondary | ICD-10-CM

## 2010-06-12 DIAGNOSIS — I10 Essential (primary) hypertension: Secondary | ICD-10-CM

## 2010-06-12 DIAGNOSIS — F32A Depression, unspecified: Secondary | ICD-10-CM | POA: Insufficient documentation

## 2010-06-12 DIAGNOSIS — R5383 Other fatigue: Secondary | ICD-10-CM

## 2010-06-12 DIAGNOSIS — B372 Candidiasis of skin and nail: Secondary | ICD-10-CM

## 2010-06-12 DIAGNOSIS — F329 Major depressive disorder, single episode, unspecified: Secondary | ICD-10-CM

## 2010-06-12 DIAGNOSIS — G43909 Migraine, unspecified, not intractable, without status migrainosus: Secondary | ICD-10-CM

## 2010-06-12 MED ORDER — PROPRANOLOL HCL ER 60 MG PO CP24: 60.0000 mg | ORAL_CAPSULE | Freq: Every day | ORAL | Status: DC

## 2010-06-12 MED ORDER — VENLAFAXINE HCL ER 150 MG PO TB24: 1.0000 | ORAL_TABLET | Freq: Every day | ORAL | Status: DC

## 2010-06-12 MED ORDER — FLUCONAZOLE 150 MG PO TABS: 150.0000 mg | ORAL_TABLET | Freq: Once | ORAL | Status: DC

## 2010-06-12 NOTE — Assessment & Plan Note (Signed)
Poorly controlled because of her stress at work. Will add propranolol and work on stress as much as able too. Right now she is the only one working in her home and she provides the financial support and this is really stressful for her.

## 2010-06-12 NOTE — Progress Notes (Signed)
  Subjective:    Patient ID: Kristen Spence, female    DOB: 05/26/1960, 50 y.o.   MRN: [id number]  Hypertension This is a chronic problem. The problem has been gradually worsening since onset. The problem is uncontrolled. Associated symptoms include chest pain. Pertinent negatives include no peripheral edema or shortness of breath. There are no associated agents to hypertension. Past treatments include ACE inhibitors and diuretics. There are no compliance problems.    She cut out her sodas to see if would help her palpitations. She is frustrated because she is gaining weight as well.  She is mentally very fatigued.  She is extremely stressed at her job and she is helping take care of her husband who has cancer and blood clots.  She tried to get in a with a counselor but can't get an appt is until June.  She does have the appt.  She says she is at her wits end. She cries all the time.  She works on the [employer] for [employer] in [employer].   She also thinks she had yeast infection in her groin creases. Tends to get this in the summer. Says diflucan works really well to knock this out. She tries to use a powder to keep the area dry.    Review of Systems  Respiratory: Negative for shortness of breath.   Cardiovascular: Positive for chest pain.       Objective:   Physical Exam  Constitutional: She is oriented to person, place, and time. She appears well-developed and well-nourished.  HENT:  Head: Normocephalic and atraumatic.  Neck: Neck supple. No thyromegaly present.  Cardiovascular: Normal rate, regular rhythm and normal heart sounds.   Pulmonary/Chest: Effort normal and breath sounds normal.  Lymphadenopathy:    She has no cervical adenopathy.  Neurological: She is alert and oriented to person, place, and time.  Skin: Skin is warm and dry.  Psychiatric: She has a normal mood and affect. Her behavior is normal. Judgment and thought content normal.          Assessment & Plan:  Tinea  cruris - Will tx with oral diflucan since this has worked well in the past. Try to keep the area dry. Wt loss would help.

## 2010-06-12 NOTE — Assessment & Plan Note (Addendum)
DBP not at goal. Will add propranolol to help[ lower BP and to help deal with her migraines as well. Clearly her migraines are worse from her stress but hopefully this will help as well.

## 2010-06-13 ENCOUNTER — Encounter: Payer: Self-pay | Admitting: Family Medicine

## 2010-06-13 ENCOUNTER — Telehealth: Payer: Self-pay | Admitting: Family Medicine

## 2010-06-13 LAB — CBC
MCV: 90.5 fL (ref 78.0–100.0)
Platelets: 371 10*3/uL (ref 150–400)
RDW: 13.8 % (ref 11.5–15.5)
WBC: 8.5 10*3/uL (ref 4.0–10.5)

## 2010-06-13 LAB — COMPLETE METABOLIC PANEL WITH GFR
Albumin: 4.2 g/dL (ref 3.5–5.2)
BUN: 14 mg/dL (ref 6–23)
CO2: 21 mEq/L (ref 19–32)
Calcium: 9.6 mg/dL (ref 8.4–10.5)
Chloride: 106 mEq/L (ref 96–112)
GFR, Est African American: >60 mL/min (ref >60)
GFR, Est Non African American: >60 mL/min (ref >60)
Glucose, Bld: 90 mg/dL (ref 70–99)
Potassium: 4.3 mEq/L (ref 3.5–5.3)

## 2010-06-13 NOTE — Telephone Encounter (Signed)
Call patient: Complete metabolic panel and complete blood count are completely normal. Thyroid is normal as well.

## 2010-06-13 NOTE — Telephone Encounter (Signed)
Pt.notified

## 2010-07-18 ENCOUNTER — Ambulatory Visit (HOSPITAL_COMMUNITY): Payer: Private Health Insurance - Indemnity | Admitting: Physician Assistant

## 2010-07-18 ENCOUNTER — Telehealth: Payer: Self-pay | Admitting: Family Medicine

## 2010-07-18 NOTE — Telephone Encounter (Signed)
Kristen Spence, please contact behavioral health to let them know about this since she already an active pt there and to confirm that she kept her appt there today.  If she did not, we can have police go to her house to have her committed.    We cannot provide out of work notes until she is seen.

## 2010-07-18 NOTE — Telephone Encounter (Signed)
Pt called and has a phychiatrist appt downstairs this morning and as she was pulling in the parking lot for her appt the office called to cancel because the provider was sick.  Resched pt for 07-29-10 @ 10 am.  Pt crying because she said the only reason she was hanging on was because she had this appt.  Started having panic attacks 3-4 days ago.  Pt feels she can't go on anymore.  Spoke with her [employer] rep this morning and she got her in with the counselor today in [place] with L[facility] Chroman.  Not eating or drinking in 1 1/2 weeks.  Husband sick with CA, and her job is very stressful.  Pt stated she had thoughts of suicide this am but no plan to carry out.  Pt agreed with me that she would call me if she felt she was going to harm herself or anyone else before doing anything.  Pt feels she now will need a leave of absence from work because she can't carry on anymore.   At the end of the conversation the connection went dead.  Tried to get pt back on the number she called from and got voice mail on more than one occasion.   Plan:  Will route this call for Dr. Metheney/Bowen to review, and see about work note and leave of absense for the pt.  Told pt to go to the counselor appt today and have them call the office if they needed to speak with PCP about any arrangements for the patient. Jarvis Newcomer, LPN [facility]

## 2010-07-18 NOTE — Telephone Encounter (Signed)
Agree with plan of care.  [facility] can give her out of work note for today if needed.

## 2010-07-18 NOTE — Telephone Encounter (Signed)
Pt went to the counselor Kristen Spence today at 2:00pm.  Has a return  appt sched for 07-25-10 @ 9am.  Tried to reach Kristen Spence several times and no one answers.  Has a re-scheduled appt with the physciatrist downstairs 07-29-10.  Pt called me back to give an update on her visit today with the counselor and she seems more together.  She had been by her employer office to let them know she could not work today.  Told pt will discuss this matter with Kristen Spence on Monday morning first thing.  In the interim, encouraged the pt to take it easy over the weekend and so something relaxing that makes her happy.  Read a book, cook, garden, just whatever will take her mind off things.  Pt voiced understanding. Routed to Kristen Spence and Dr. Marlyne Beards, LPN [facility]

## 2010-07-21 ENCOUNTER — Telehealth: Payer: Self-pay | Admitting: Family Medicine

## 2010-07-21 NOTE — Telephone Encounter (Signed)
Pt called again this am and states she is still very depressed.  The tone of her voice sounds depressed.  Wants to know what she can do about her depression.  Please see original note sent last week to you Dr. Linford Arnold regarding this patient.  Pt wants to do FMLA, and feels she is going to need some medication.  Saw counselor last Friday, and physciatrist cancelled pt appt because she got in accident.  They resched her appt for 07-29-10.  See's counselor in [place] before that again. Please advise as soon as you can because pt is apprehensive and depressed. Routed to Dr. Marlyne Beards, LPN [facility]

## 2010-07-21 NOTE — Telephone Encounter (Signed)
Notified pt after speaking with Dr. Linford Arnold. Plan:  Scheduled office visit 07-24-10 since pt needs meds for depression/panic attacks/anxiety. Will need work note dating from 07-18-10 to 07-29-10.  Okay to do this per Dr. Linford Arnold.  Jarvis Newcomer, LPN [facility]

## 2010-07-24 ENCOUNTER — Encounter: Payer: Self-pay | Admitting: Family Medicine

## 2010-07-24 ENCOUNTER — Ambulatory Visit (INDEPENDENT_AMBULATORY_CARE_PROVIDER_SITE_OTHER): Payer: Private Health Insurance - Indemnity | Admitting: Family Medicine

## 2010-07-24 VITALS — BP 134/87 | HR 81 | Ht 63.0 in | Wt 210.0 lb

## 2010-07-24 DIAGNOSIS — F329 Major depressive disorder, single episode, unspecified: Secondary | ICD-10-CM

## 2010-07-24 DIAGNOSIS — F3289 Other specified depressive episodes: Secondary | ICD-10-CM

## 2010-07-24 DIAGNOSIS — F32A Depression, unspecified: Secondary | ICD-10-CM

## 2010-07-24 MED ORDER — CITALOPRAM HYDROBROMIDE 20 MG PO TABS: 20.0000 mg | ORAL_TABLET | Freq: Every day | ORAL | Status: DC

## 2010-07-24 MED ORDER — CLONAZEPAM 0.5 MG PO TABS: 0.5000 mg | ORAL_TABLET | Freq: Two times a day (BID) | ORAL | Status: AC | PRN

## 2010-07-24 NOTE — Progress Notes (Signed)
  Subjective:    Patient ID: Kristen Spence, female    DOB: 07/21/1960, 50 y.o.   MRN: [id number]  HPI Kristen Spence has been very anxious and depressed. Was schedule for psychiatry but the MD was in an accident and so appt was cancelled. Called her [employer] at work they set her up with a Veterinary surgeon. She is having panic attacks with sweats and chest pain.  Couldn't go to work on Friday because use she was so upset. She is getting forgetful at work.  Difficulty concentraing. Dec apptite. Vomiting after eating.  Sometimes feels like she would be better off dead.  Has had to omany deaths in the last year. Says yesterday didnb't even get out of her pajamas. She is very overwhelmed and says she is hanging on by a thread.She would never really answer my question about wether or not she is actively suicidal. Her answer was that she was close last Friday.  Review of Systems     Objective:   Physical Exam  Constitutional: She is oriented to person, place, and time. She appears well-developed and well-nourished.  Neurological: She is alert and oriented to person, place, and time.  Psychiatric: She has a normal mood and affect. Her behavior is normal. Judgment and thought content normal.          Assessment & Plan:

## 2010-07-24 NOTE — Patient Instructions (Signed)
Take the effexor every other day for one week, then stop.  Start the citalopram 1/2 tab a day for one week, then increase to whole tab. Klonopin - take it twice a day.

## 2010-07-24 NOTE — Assessment & Plan Note (Signed)
New appt for counselor is next week.  PHQ-9 socre of 24. i asked her to call 911 or a supportive family member if she feels she might hurt herself. Will also start klonopin bid for now as she transitions to citalopram.  F/u on Monday.  I want her to be in a safe environment. I wrote her out of work for one week.

## 2010-07-28 ENCOUNTER — Encounter: Payer: Self-pay | Admitting: Family Medicine

## 2010-07-28 ENCOUNTER — Ambulatory Visit (INDEPENDENT_AMBULATORY_CARE_PROVIDER_SITE_OTHER): Payer: Private Health Insurance - Indemnity | Admitting: Family Medicine

## 2010-07-28 DIAGNOSIS — F32A Depression, unspecified: Secondary | ICD-10-CM

## 2010-07-28 DIAGNOSIS — F329 Major depressive disorder, single episode, unspecified: Secondary | ICD-10-CM

## 2010-07-28 MED ORDER — OMEPRAZOLE 20 MG PO CPDR: 40.0000 mg | DELAYED_RELEASE_CAPSULE | Freq: Every day | ORAL | Status: DC

## 2010-07-28 NOTE — Progress Notes (Signed)
  Subjective:    Patient ID: Kristen Spence, female    DOB: [birth year], 50 y.o.   MRN: [id number]  HPI Her counseling appt tomorrow.  Will be seeing them tomorrow.  Doing well.  Has called her office about FMLA.  Sleeping too much. Feels exhausted.  Didn't have a breakdown yesterday. She is taking the klonopin bid.  She is still on half a tab of citalopram while changes her effexor. She id doing well thus far.  Thinks the citalopram could be making her more sleepy, but says has felt really sleepy for about 3 weeks even before starting the meds. She feels relieved that people are listening to her that she needs help. Hasn't had thoughts of hurting herself this weekend like was before.    Hx of bleeding ulcer. Insurance won't approve the 40mg  so needs to be written for 20 mg bid.  Needs rew rx.    Review of Systems     Objective:   Physical Exam     She is stable today.  Less tearful.. Still very astressed. She is well groomed.    Assessment & Plan:

## 2010-07-28 NOTE — Assessment & Plan Note (Signed)
Stable today. Will start counseling tomorrow. F/u in 3 weeks to make sure doing well on the citalopram. Continue the klonopin bid until then and then wean as appropriate.

## 2010-07-29 ENCOUNTER — Ambulatory Visit (INDEPENDENT_AMBULATORY_CARE_PROVIDER_SITE_OTHER): Payer: Private Health Insurance - Indemnity | Admitting: Physician Assistant

## 2010-07-29 DIAGNOSIS — F339 Major depressive disorder, recurrent, unspecified: Secondary | ICD-10-CM

## 2010-07-30 ENCOUNTER — Telehealth: Payer: Self-pay | Admitting: Family Medicine

## 2010-07-30 NOTE — Telephone Encounter (Signed)
[employer] called and said they need more info regarding pt.  They need the medical information regarding a diability claim and behavioral health clinician statement which was apparently sent???  Fax to Mindy at [phone] or call Mindy (the case mgr) @ [phone]. Routed to Dr. Marlyne Beards, LPN [facility]

## 2010-07-30 NOTE — Telephone Encounter (Signed)
I don't know what that means? Do they want a copy of her notes?   I have been waiting for her FMLA paperwork and as of 2PM today is was not in my box.  Maybe they need to refax her FMLA?  Also she orig appt with behavioral health was canceled so she just went this week and she will have to get a statement for that part directly from them.

## 2010-07-31 NOTE — Telephone Encounter (Signed)
Called Mindy at [place] and explained to her that Dr. Linford Arnold is not exactly sure what is being requested on this pt.  Dr. Linford Arnold is waiting on FMLA papers but as of 2 pm yesterday she did not have those as expected.  Pt just got seen this week for a psychiatrist appt downstairs since that appt was resched due to provider rescheduling and they will have to do their proportion.  Told Mindy to call me back on the triage line in order that I can find out specifically what is needed from our office at this point. Jarvis Newcomer, LPN [facility]

## 2010-08-04 NOTE — Telephone Encounter (Signed)
Insurance called back and said they and faxed over a behavioral health clinician statement.  If office notes used has to have a substantial amount in order to get pt qualified for disability.  Will need info by 08-06-10 in order for the claim to not be denied and closed. Please advise. Pharmacologist with [employer] @ [phone])

## 2010-08-04 NOTE — Telephone Encounter (Signed)
Completed.  It should have been faxed today.

## 2010-08-08 ENCOUNTER — Telehealth: Payer: Self-pay | Admitting: Family Medicine

## 2010-08-08 ENCOUNTER — Ambulatory Visit (HOSPITAL_COMMUNITY)
Admission: RE | Admit: 2010-08-08 | Discharge: 2010-08-08 | Disposition: A | Discharge status: Home / Self Care | Payer: Private Health Insurance - Indemnity | Source: Ambulatory Visit | Attending: Psychiatry | Admitting: Psychiatry

## 2010-08-08 DIAGNOSIS — F332 Major depressive disorder, recurrent severe without psychotic features: Secondary | ICD-10-CM | POA: Insufficient documentation

## 2010-08-08 NOTE — Telephone Encounter (Signed)
Pt called earlier today and her call was returned by the triage nurse but pt was not available and had to [facility]. Kristen Newcomer, LPN [facility]

## 2010-08-09 NOTE — Telephone Encounter (Signed)
This pt has been helped and disability papers faxed and taken care of and the insurance disabiltiy folks were contacted and informed to look out for forms. Pt aware.\ Kristen Newcomer, LPN [facility]

## 2010-08-13 ENCOUNTER — Other Ambulatory Visit (HOSPITAL_COMMUNITY): Payer: Private Health Insurance - Indemnity | Attending: Psychiatry | Admitting: Psychiatry

## 2010-08-13 DIAGNOSIS — F332 Major depressive disorder, recurrent severe without psychotic features: Secondary | ICD-10-CM | POA: Insufficient documentation

## 2010-08-13 DIAGNOSIS — D649 Anemia, unspecified: Secondary | ICD-10-CM | POA: Insufficient documentation

## 2010-08-13 DIAGNOSIS — R45851 Suicidal ideations: Secondary | ICD-10-CM | POA: Insufficient documentation

## 2010-08-13 DIAGNOSIS — Z6379 Other stressful life events affecting family and household: Secondary | ICD-10-CM | POA: Insufficient documentation

## 2010-08-13 DIAGNOSIS — IMO0002 Reserved for concepts with insufficient information to code with codable children: Secondary | ICD-10-CM | POA: Insufficient documentation

## 2010-08-14 ENCOUNTER — Other Ambulatory Visit (HOSPITAL_COMMUNITY): Payer: Private Health Insurance - Indemnity | Attending: Psychiatry | Admitting: Psychiatry

## 2010-08-14 DIAGNOSIS — IMO0002 Reserved for concepts with insufficient information to code with codable children: Secondary | ICD-10-CM | POA: Insufficient documentation

## 2010-08-14 DIAGNOSIS — F332 Major depressive disorder, recurrent severe without psychotic features: Secondary | ICD-10-CM | POA: Insufficient documentation

## 2010-08-14 DIAGNOSIS — D649 Anemia, unspecified: Secondary | ICD-10-CM | POA: Insufficient documentation

## 2010-08-15 ENCOUNTER — Other Ambulatory Visit (HOSPITAL_BASED_OUTPATIENT_CLINIC_OR_DEPARTMENT_OTHER): Payer: Private Health Insurance - Indemnity | Admitting: Psychiatry

## 2010-08-15 DIAGNOSIS — F410 Panic disorder [episodic paroxysmal anxiety] without agoraphobia: Secondary | ICD-10-CM

## 2010-08-18 ENCOUNTER — Other Ambulatory Visit (HOSPITAL_COMMUNITY): Payer: Private Health Insurance - Indemnity | Admitting: Psychiatry

## 2010-08-18 ENCOUNTER — Ambulatory Visit (INDEPENDENT_AMBULATORY_CARE_PROVIDER_SITE_OTHER): Payer: Private Health Insurance - Indemnity | Admitting: Family Medicine

## 2010-08-18 ENCOUNTER — Encounter: Payer: Self-pay | Admitting: Family Medicine

## 2010-08-18 VITALS — BP 153/99 | HR 73 | Wt 209.0 lb

## 2010-08-18 DIAGNOSIS — F329 Major depressive disorder, single episode, unspecified: Secondary | ICD-10-CM

## 2010-08-18 DIAGNOSIS — F32A Depression, unspecified: Secondary | ICD-10-CM

## 2010-08-18 MED ORDER — CITALOPRAM HYDROBROMIDE 40 MG PO TABS: 40.0000 mg | ORAL_TABLET | Freq: Every day | ORAL | Status: DC

## 2010-08-18 NOTE — Progress Notes (Signed)
  Subjective:    Patient ID: Kristen Spence, female    DOB: [birth year], 50 y.o.   MRN: [id number]  HPI  She is getting intensive therapy at [facility] for depression. She saw Dr. Jennette Kettle who evaluated her and recommended she start with Pyschological Intensive Outpatient Program. This is daily therapy.  Feels really tired and exhausted.  Met with their therapist on Friday. They want to change her med in about 2 weeks.  She is on the citalopram.   She really had a bad weekend. It was very emotional.  Still using her klonopin. She feels the IOP has been really helping.  Still having a lot of diarrhea with her nerves.. The original FMLA date was to return on 08/25/10. She Will still be in IOP for about 2-3 more weeks.  She still has feeling of hopelessness, trouble fallins asleep, no energy, no appetitie and trouble concentrating.    Review of Systems     Objective:   Physical Exam  Constitutional: She is oriented to person, place, and time. She appears well-developed and well-nourished.  HENT:  Head: Normocephalic and atraumatic.  Cardiovascular: Normal rate, regular rhythm and normal heart sounds.   Pulmonary/Chest: Effort normal and breath sounds normal.  Neurological: She is alert and oriented to person, place, and time.  Skin: Skin is warm and dry.  Psychiatric: She has a normal mood and affect.          Assessment & Plan:   Depression - Will continue current dose of citaloprm and she is on Effexor per pyschiatry.  Discussed will extend her FMLA for 3 more weeks until is able to see Dr. Jennette Kettle back.  She is doing some better. Her PHQ score is 23.  Her suicidal thoughts have improved significantly.  Stable. F/U in 2 weeks.  Continue daily IOP.

## 2010-08-19 ENCOUNTER — Other Ambulatory Visit (HOSPITAL_COMMUNITY): Payer: Private Health Insurance - Indemnity | Attending: Psychiatry | Admitting: Psychiatry

## 2010-08-19 DIAGNOSIS — F332 Major depressive disorder, recurrent severe without psychotic features: Secondary | ICD-10-CM | POA: Insufficient documentation

## 2010-08-19 DIAGNOSIS — D649 Anemia, unspecified: Secondary | ICD-10-CM | POA: Insufficient documentation

## 2010-08-19 DIAGNOSIS — IMO0002 Reserved for concepts with insufficient information to code with codable children: Secondary | ICD-10-CM | POA: Insufficient documentation

## 2010-08-19 DIAGNOSIS — Z6379 Other stressful life events affecting family and household: Secondary | ICD-10-CM | POA: Insufficient documentation

## 2010-08-20 ENCOUNTER — Other Ambulatory Visit (HOSPITAL_COMMUNITY): Payer: Private Health Insurance - Indemnity | Attending: Psychiatry | Admitting: Psychiatry

## 2010-08-20 DIAGNOSIS — Z818 Family history of other mental and behavioral disorders: Secondary | ICD-10-CM | POA: Insufficient documentation

## 2010-08-20 DIAGNOSIS — F410 Panic disorder [episodic paroxysmal anxiety] without agoraphobia: Secondary | ICD-10-CM | POA: Insufficient documentation

## 2010-08-20 DIAGNOSIS — K279 Peptic ulcer, site unspecified, unspecified as acute or chronic, without hemorrhage or perforation: Secondary | ICD-10-CM | POA: Insufficient documentation

## 2010-08-20 DIAGNOSIS — Z6379 Other stressful life events affecting family and household: Secondary | ICD-10-CM | POA: Insufficient documentation

## 2010-08-20 DIAGNOSIS — F331 Major depressive disorder, recurrent, moderate: Secondary | ICD-10-CM | POA: Insufficient documentation

## 2010-08-20 DIAGNOSIS — D649 Anemia, unspecified: Secondary | ICD-10-CM | POA: Insufficient documentation

## 2010-08-20 DIAGNOSIS — R45851 Suicidal ideations: Secondary | ICD-10-CM | POA: Insufficient documentation

## 2010-08-20 DIAGNOSIS — IMO0002 Reserved for concepts with insufficient information to code with codable children: Secondary | ICD-10-CM | POA: Insufficient documentation

## 2010-08-21 ENCOUNTER — Other Ambulatory Visit (HOSPITAL_COMMUNITY): Payer: Private Health Insurance - Indemnity | Attending: Psychiatry | Admitting: Psychiatry

## 2010-08-21 DIAGNOSIS — F410 Panic disorder [episodic paroxysmal anxiety] without agoraphobia: Secondary | ICD-10-CM | POA: Insufficient documentation

## 2010-08-21 DIAGNOSIS — F331 Major depressive disorder, recurrent, moderate: Secondary | ICD-10-CM | POA: Insufficient documentation

## 2010-08-21 DIAGNOSIS — IMO0002 Reserved for concepts with insufficient information to code with codable children: Secondary | ICD-10-CM | POA: Insufficient documentation

## 2010-08-21 DIAGNOSIS — K279 Peptic ulcer, site unspecified, unspecified as acute or chronic, without hemorrhage or perforation: Secondary | ICD-10-CM | POA: Insufficient documentation

## 2010-08-21 DIAGNOSIS — D649 Anemia, unspecified: Secondary | ICD-10-CM | POA: Insufficient documentation

## 2010-08-21 DIAGNOSIS — Z6379 Other stressful life events affecting family and household: Secondary | ICD-10-CM | POA: Insufficient documentation

## 2010-08-22 ENCOUNTER — Other Ambulatory Visit (HOSPITAL_COMMUNITY): Payer: Private Health Insurance - Indemnity | Admitting: Psychiatry

## 2010-08-25 ENCOUNTER — Other Ambulatory Visit (HOSPITAL_COMMUNITY): Payer: Private Health Insurance - Indemnity | Admitting: Psychiatry

## 2010-08-27 ENCOUNTER — Other Ambulatory Visit (HOSPITAL_COMMUNITY): Payer: Private Health Insurance - Indemnity | Admitting: Psychiatry

## 2010-08-28 ENCOUNTER — Other Ambulatory Visit (HOSPITAL_COMMUNITY): Payer: Private Health Insurance - Indemnity | Attending: Psychiatry | Admitting: Psychiatry

## 2010-08-28 DIAGNOSIS — K279 Peptic ulcer, site unspecified, unspecified as acute or chronic, without hemorrhage or perforation: Secondary | ICD-10-CM | POA: Insufficient documentation

## 2010-08-28 DIAGNOSIS — F331 Major depressive disorder, recurrent, moderate: Secondary | ICD-10-CM | POA: Insufficient documentation

## 2010-08-28 DIAGNOSIS — F410 Panic disorder [episodic paroxysmal anxiety] without agoraphobia: Secondary | ICD-10-CM | POA: Insufficient documentation

## 2010-08-28 DIAGNOSIS — R51 Headache: Secondary | ICD-10-CM | POA: Insufficient documentation

## 2010-08-29 ENCOUNTER — Other Ambulatory Visit (HOSPITAL_COMMUNITY): Payer: Private Health Insurance - Indemnity | Admitting: Psychiatry

## 2010-09-01 ENCOUNTER — Ambulatory Visit (INDEPENDENT_AMBULATORY_CARE_PROVIDER_SITE_OTHER): Payer: Private Health Insurance - Indemnity | Admitting: Family Medicine

## 2010-09-01 ENCOUNTER — Encounter: Payer: Self-pay | Admitting: Family Medicine

## 2010-09-01 ENCOUNTER — Other Ambulatory Visit (HOSPITAL_COMMUNITY): Payer: Private Health Insurance - Indemnity | Admitting: Psychiatry

## 2010-09-01 DIAGNOSIS — F329 Major depressive disorder, single episode, unspecified: Secondary | ICD-10-CM

## 2010-09-01 DIAGNOSIS — F32A Depression, unspecified: Secondary | ICD-10-CM

## 2010-09-01 MED ORDER — CLONAZEPAM 0.5 MG PO TABS: 0.5000 mg | ORAL_TABLET | Freq: Every day | ORAL | Status: DC | PRN

## 2010-09-01 NOTE — Assessment & Plan Note (Signed)
Encouraged her to f/u with Dr. Jennette Kettle to see if her meds need to be adjusted. I do think therapy has been helping her. She feels private therapy would be helpful at this time. I asked her to discuss this with Dr. Jennette Kettle to see if this could be arranged.  She is still out of work on [employer] and should remain so.

## 2010-09-01 NOTE — Progress Notes (Signed)
  Subjective:    Patient ID: Kristen Spence, female    DOB: [birth year], 50 y.o.   MRN: [id number]  HPI She has been going to therapy regularly. Last Friday had 3 panic attacks after getting off the phone with work. 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 Had therapy today. Still going daily basis through IOP.  She is supposed to follow up back up with Dr. Jennette Kettle soon.  She is really low energy as well for the last several months . Had normal cbc and and tsh. Husband is now non ambulatory and so her burden of helping him has increase.  She still feel hopeless and says she doesn't know if she can ever feel hopeful. Has days where she feels like she doesn't want to wake up. Says most days only gets out of bed to take care of her husband.. Says she has been more forgetful.  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   Review of Systems     Objective:   Physical Exam  Constitutional: She is oriented to person, place, and time. She appears well-developed and well-nourished.  Neurological: She is alert and oriented to person, place, and time.  Skin: Skin is warm and dry.  Psychiatric: She has a normal mood and affect. Her behavior is normal.          Assessment & Plan:

## 2010-09-02 ENCOUNTER — Other Ambulatory Visit (HOSPITAL_COMMUNITY): Payer: Private Health Insurance - Indemnity | Attending: Psychiatry | Admitting: Psychiatry

## 2010-09-02 DIAGNOSIS — Z6379 Other stressful life events affecting family and household: Secondary | ICD-10-CM | POA: Insufficient documentation

## 2010-09-02 DIAGNOSIS — IMO0002 Reserved for concepts with insufficient information to code with codable children: Secondary | ICD-10-CM | POA: Insufficient documentation

## 2010-09-02 DIAGNOSIS — Z79899 Other long term (current) drug therapy: Secondary | ICD-10-CM | POA: Insufficient documentation

## 2010-09-02 DIAGNOSIS — G43909 Migraine, unspecified, not intractable, without status migrainosus: Secondary | ICD-10-CM | POA: Insufficient documentation

## 2010-09-02 DIAGNOSIS — F331 Major depressive disorder, recurrent, moderate: Secondary | ICD-10-CM | POA: Insufficient documentation

## 2010-09-02 DIAGNOSIS — Z818 Family history of other mental and behavioral disorders: Secondary | ICD-10-CM | POA: Insufficient documentation

## 2010-09-02 DIAGNOSIS — K279 Peptic ulcer, site unspecified, unspecified as acute or chronic, without hemorrhage or perforation: Secondary | ICD-10-CM | POA: Insufficient documentation

## 2010-09-02 DIAGNOSIS — F410 Panic disorder [episodic paroxysmal anxiety] without agoraphobia: Secondary | ICD-10-CM | POA: Insufficient documentation

## 2010-09-03 ENCOUNTER — Other Ambulatory Visit (HOSPITAL_COMMUNITY): Payer: Private Health Insurance - Indemnity | Admitting: Psychiatry

## 2010-09-04 ENCOUNTER — Other Ambulatory Visit (HOSPITAL_COMMUNITY): Payer: Private Health Insurance - Indemnity | Admitting: Psychiatry

## 2010-09-05 ENCOUNTER — Other Ambulatory Visit (HOSPITAL_COMMUNITY): Payer: Private Health Insurance - Indemnity | Admitting: Psychiatry

## 2010-09-08 ENCOUNTER — Other Ambulatory Visit (HOSPITAL_COMMUNITY): Payer: Private Health Insurance - Indemnity | Admitting: Psychiatry

## 2010-09-08 ENCOUNTER — Encounter (HOSPITAL_COMMUNITY): Payer: Private Health Insurance - Indemnity | Admitting: Physician Assistant

## 2010-09-09 ENCOUNTER — Other Ambulatory Visit (HOSPITAL_COMMUNITY): Payer: Private Health Insurance - Indemnity | Admitting: Psychiatry

## 2010-09-10 ENCOUNTER — Other Ambulatory Visit (HOSPITAL_COMMUNITY): Payer: Private Health Insurance - Indemnity | Admitting: Psychiatry

## 2010-09-10 ENCOUNTER — Ambulatory Visit (INDEPENDENT_AMBULATORY_CARE_PROVIDER_SITE_OTHER): Payer: Private Health Insurance - Indemnity | Admitting: Psychology

## 2010-09-10 DIAGNOSIS — F331 Major depressive disorder, recurrent, moderate: Secondary | ICD-10-CM

## 2010-09-11 ENCOUNTER — Telehealth: Payer: Self-pay | Admitting: *Deleted

## 2010-09-11 NOTE — Telephone Encounter (Signed)
Pt.notified

## 2010-09-11 NOTE — Telephone Encounter (Signed)
The needs to call the urologist fot the meds for nausea and vomiting and pain. OK to drop off papers.

## 2010-09-11 NOTE — Telephone Encounter (Signed)
Pt is having a kidney stone removed Monday. Pt is having nausea and vomiting and would like medication called in. Also pt states she is supposed to go back to work Mon but the procedure is scheduled for this day. Wants disability extended to Tues or Wed. Pt has disability papers that she wants to drop off in order for her disability to be extended

## 2010-09-12 ENCOUNTER — Other Ambulatory Visit (HOSPITAL_COMMUNITY): Payer: Private Health Insurance - Indemnity | Admitting: Psychiatry

## 2010-09-15 ENCOUNTER — Other Ambulatory Visit (HOSPITAL_COMMUNITY): Payer: Private Health Insurance - Indemnity | Admitting: Psychiatry

## 2010-09-15 ENCOUNTER — Encounter (HOSPITAL_COMMUNITY): Payer: Private Health Insurance - Indemnity | Admitting: Physician Assistant

## 2010-09-17 ENCOUNTER — Other Ambulatory Visit (HOSPITAL_COMMUNITY): Payer: Private Health Insurance - Indemnity | Admitting: Psychiatry

## 2010-09-19 ENCOUNTER — Telehealth: Payer: Self-pay | Admitting: Family Medicine

## 2010-09-19 ENCOUNTER — Other Ambulatory Visit (HOSPITAL_COMMUNITY): Payer: Private Health Insurance - Indemnity | Admitting: Psychiatry

## 2010-09-19 NOTE — Telephone Encounter (Signed)
Patient came in today to pick up her paperwork and she ask that Dr. Linford Arnold call her today at some point because she has something she needs to tell Dr. Linford Arnold that is important. Pt's call back number is [phone]. Thanks

## 2010-09-24 ENCOUNTER — Other Ambulatory Visit (HOSPITAL_COMMUNITY): Payer: Private Health Insurance - Indemnity | Admitting: Psychiatry

## 2010-09-24 ENCOUNTER — Encounter (HOSPITAL_COMMUNITY): Payer: Private Health Insurance - Indemnity | Admitting: Psychology

## 2010-09-26 ENCOUNTER — Other Ambulatory Visit (HOSPITAL_COMMUNITY): Payer: Private Health Insurance - Indemnity | Admitting: Psychiatry

## 2010-10-01 NOTE — Telephone Encounter (Signed)
Called to let me know her husband was dx with cancer (basal cell ) and it is growing into his hip pain. He is in the hospital and is not expected to live  Long. She is back at work and really struggling emotionally. She wanted to give me an update.

## 2010-10-21 ENCOUNTER — Telehealth: Payer: Self-pay | Admitting: Family Medicine

## 2010-10-21 NOTE — Telephone Encounter (Signed)
Called [facility].  Acquired prior auth from [place] starting today 10-21-10 till 10-21-2011 for the pts omeprazole 20 mg (2 pills daily for total 40 mg daily).  To call the pharm and tell them to re-run this claim in 15-20 minutes.

## 2010-10-21 NOTE — Telephone Encounter (Signed)
Pt called on 10-20-10 and said she is on omeprazole 20mg  (takes 2 daily for total 40 mg QD).  Insurance is requiring a prior [place].  Pt has history of hiatal hernia with reflux. Plan:  Called toll free # to [facility] for prior auth.  Will have to call back once they open. Jarvis Newcomer, LPN [facility]

## 2010-10-21 NOTE — Telephone Encounter (Signed)
Closed

## 2010-11-18 ENCOUNTER — Encounter: Payer: Self-pay | Admitting: Emergency Medicine

## 2010-11-18 ENCOUNTER — Inpatient Hospital Stay (INDEPENDENT_AMBULATORY_CARE_PROVIDER_SITE_OTHER)
Admission: RE | Admit: 2010-11-18 | Discharge: 2010-11-18 | Disposition: A | Discharge status: Home / Self Care | Payer: Private Health Insurance - Indemnity | Source: Ambulatory Visit | Attending: Emergency Medicine | Admitting: Emergency Medicine

## 2010-11-18 DIAGNOSIS — J209 Acute bronchitis, unspecified: Secondary | ICD-10-CM

## 2010-11-18 DIAGNOSIS — R059 Cough, unspecified: Secondary | ICD-10-CM

## 2010-11-18 DIAGNOSIS — R300 Dysuria: Secondary | ICD-10-CM

## 2010-11-18 DIAGNOSIS — R05 Cough: Secondary | ICD-10-CM

## 2010-11-18 LAB — CONVERTED CEMR LAB
Ketones, urine, test strip: NEGATIVE
Protein, U semiquant: NEGATIVE
Specific Gravity, Urine: 1.02
Urobilinogen, UA: 0.2

## 2010-11-21 ENCOUNTER — Telehealth (INDEPENDENT_AMBULATORY_CARE_PROVIDER_SITE_OTHER): Payer: Self-pay | Admitting: Emergency Medicine

## 2010-12-22 NOTE — Progress Notes (Signed)
Summary: cough   Vital Signs:  Patient Profile:   49 Years Old Female CC:      dry cough x 2 weeks, odorus urine Height:     62 inches Weight:      203 pounds O2 Sat:      96 % O2 treatment:    Room Air Temp:     97.6 degrees F oral Pulse rate:   91 / minute Resp:     16 per minute BP sitting:   147 / 88  (left arm) Cuff size:   large  Vitals Entered By: Lajean Saver RN (November 18, 2010 3:00 PM)                  Updated Prior Medication List: OMEPRAZOLE 40 MG CPDR (OMEPRAZOLE) Take 1 tablet by mouth once a day MAXALT 10 MG  TABS (RIZATRIPTAN BENZOATE) Use as needed for H/A LISINOPRIL-HYDROCHLOROTHIAZIDE 20-25 MG TABS (LISINOPRIL-HYDROCHLOROTHIAZIDE) Take 1 tablet by mouth once a day VALACYCLOVIR HCL 1 GM TABS (VALACYCLOVIR HCL) 2 tabs by mouth two times a day for one day. (4 total for one day). LORATADINE 10 MG TABS (LORATADINE) Take 1 tablet by mouth once a day  Current Allergies: No known allergies History of Present Illness Chief Complaint: dry cough x 2 weeks, odorus urine History of Present Illness: 50 Years Old Female complains of onset of cold symptoms for 10 days.  Ahjanae has been using OTC cold meds which is helping a little bit.  She worked in her yard 2 weeks ago and feels this started the cough.  Also with cloudy urine last few days and wondering about a UTI.   +/- sore throat ++ cough (main symptom) No pleuritic pain No wheezing + nasal congestion + post-nasal drainage No sinus pain/pressure + chest congestion No itchy/red eyes No earache No hemoptysis No SOB No chills/sweats No fever No nausea No vomiting No abdominal pain No diarrhea No skin rashes No fatigue No myalgias No headache   REVIEW OF SYSTEMS Constitutional Symptoms      Denies fever, chills, night sweats, weight loss, weight gain, and fatigue.  Eyes       Denies change in vision, eye pain, eye discharge, glasses, contact lenses, and eye surgery. Ear/Nose/Throat/Mouth  Denies hearing loss/aids, change in hearing, ear pain, ear discharge, dizziness, frequent runny nose, frequent nose bleeds, sinus problems, sore throat, hoarseness, and tooth pain or bleeding.  Respiratory       Complains of dry cough.      Denies productive cough, wheezing, shortness of breath, asthma, bronchitis, and emphysema/COPD.  Cardiovascular       Denies murmurs, chest pain, and tires easily with exhertion.    Gastrointestinal       Denies stomach pain, nausea/vomiting, diarrhea, constipation, blood in bowel movements, and indigestion. Genitourniary       Denies painful urination, blood or discharge from vagina, kidney stones, and loss of urinary control.      Comments: odorus urine Neurological       Denies paralysis, seizures, and fainting/blackouts. Musculoskeletal       Denies muscle pain, joint pain, joint stiffness, decreased range of motion, redness, swelling, muscle weakness, and gout.  Skin       Denies bruising, unusual mles/lumps or sores, and hair/skin or nail changes.  Psych       Denies mood changes, temper/anger issues, anxiety/stress, speech problems, depression, and sleep problems.  Past History:  Past Medical History: Reviewed history from 02/18/2010 and no changes  required. PALPITATIONS HYPERTENSION, BENIGN FATIGUE  BENIGN POSITIONAL VERTIGO  INSOMNIA  HIATAL HERNIA WITH REFLUX  PELVIC  PAIN OBESITY  DIZZINESS  MIGRAINE HEADACHE ANEMIA, IRON DEFICIENCY EDEMA   EGD 03/2008 showed Grade D reflux esophagitis.    Past Surgical History: Reviewed history from 03/11/2009 and no changes required. Cholecystectomy 7/87 C/sec 1/82, 4/85 Lumpectomy Breast 2000 Endometrial Ablation 09/30/06 Complete hysterctomy  07/27/08 with Dr.Stacey Thad Ranger  Social History: Reviewed history from 12/09/2006 and no changes required. 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 Completed 10th grade. Married to [place] with 2 adult children.  Never Smoked Alcohol use-no Drug  use-no Regular exercise-no Physical Exam General appearance: well developed, well nourished, coughing throughout exam Ears: normal, no lesions or deformities Nasal: mucosa pink, nonedematous, no septal deviation, turbinates normal Oral/Pharynx: pharyngeal mild erythema without exudate, uvula midline without deviation Chest/Lungs: no rales, wheezes, or rhonchi bilateral, breath sounds equal without effort Heart: regular rate and  rhythm, no murmur MSE: oriented to time, place, and person Assessment New Problems: DYSURIA (ICD-788.1) BRONCHITIS, ACUTE (ICD-466.0) COUGH (ICD-786.2)   Plan New Medications/Changes: HYDROCODONE-HOMATROPINE 5-1.5 MG/5ML SYRP (HYDROCODONE-HOMATROPINE) 5cc q6 hrs as needed for cough  #4oz x 0, 11/18/2010, Hoyt Koch MD ZITHROMAX Z-PAK 250 MG TABS (AZITHROMYCIN) use as directed  #1 x 0, 11/18/2010, Hoyt Koch MD  New Orders: Pulse Oximetry (single measurment) [94760] Est. Patient Level IV [11914] UA Dipstick w/o Micro (automated)  [81003] T-Culture, Urine [[id number]] Planning Comments:   1)  Take the prescribed antibiotic as instructed.  Rx for cough meds.  If not improving, consider prednisone pack.  UA no infection, but will culture. 2)  Use nasal saline solution (over the counter) at least 3 times a day. 3)  Use over the counter decongestants like Zyrtec-D every 12 hours as needed to help with congestion. 4)  Can take tylenol every 6 hours or motrin every 8 hours for pain or fever. 5)  Follow up with your primary doctor  if no improvement in 5-7 days, sooner if increasing pain, fever, or new symptoms.    The patient and/or caregiver has been counseled thoroughly with regard to medications prescribed including dosage, schedule, interactions, rationale for use, and possible side effects and they verbalize understanding.  Diagnoses and expected course of recovery discussed and will return if not improved as expected or if the condition  worsens. Patient and/or caregiver verbalized understanding.  Prescriptions: HYDROCODONE-HOMATROPINE 5-1.5 MG/5ML SYRP (HYDROCODONE-HOMATROPINE) 5cc q6 hrs as needed for cough  #4oz x 0   Entered and Authorized by:   Hoyt Koch MD   Signed by:   Hoyt Koch MD on 11/18/2010   Method used:   Print then Give to Patient   RxID:   [id number] ZITHROMAX Z-PAK 250 MG TABS (AZITHROMYCIN) use as directed  #1 x 0   Entered and Authorized by:   Hoyt Koch MD   Signed by:   Hoyt Koch MD on 11/18/2010   Method used:   Print then Give to Patient   RxID:   [id number]   Orders Added: 1)  Pulse Oximetry (single measurment) [94760] 2)  Est. Patient Level IV [03474] 3)  UA Dipstick w/o Micro (automated)  [81003] 4)  T-Culture, Urine [[id number]]    Laboratory Results   Urine Tests  Date/Time Received: November 18, 2010 3:04 PM  Date/Time Reported: November 18, 2010 3:04 PM   Routine Urinalysis   Color: yellow Appearance: slightly cloudy Glucose: negative   (Normal Range: Negative) Bilirubin: negative   (Normal Range: Negative) Ketone:  negative   (Normal Range: Negative) Spec. Gravity: 1.020   (Normal Range: 1.003-1.035) Blood: small   (Normal Range: Negative) pH: 5.5   (Normal Range: 5.0-8.0) Protein: negative   (Normal Range: Negative) Urobilinogen: 0.2   (Normal Range: 0-1) Nitrite: negative   (Normal Range: Negative) Leukocyte Esterace: negative   (Normal Range: Negative)

## 2010-12-22 NOTE — Telephone Encounter (Signed)
  Phone Note Outgoing Call   Call placed by: Lavell Islam RN,  November 21, 2010 9:55 AM Call placed to: Patient Summary of Call: Correction: Prednisone 10mg  6-day pack. Initial call taken by: Lavell Islam RN,  November 21, 2010 9:56 AM

## 2010-12-22 NOTE — Telephone Encounter (Signed)
  Phone Note Outgoing Call   Call placed by: Lavell Islam RN,  November 21, 2010 9:50 AM Action Taken: Phone Call Completed Summary of Call: Spoke with patient who is on 3rd day of zpack with continued coughing; Dr.Henderson ordered 6 day [facility]osne [facility] [employer]. Also told pt. her urine culture negative. Initial call taken by: Lavell Islam RN,  November 21, 2010 9:52 AM

## 2011-01-29 ENCOUNTER — Ambulatory Visit (INDEPENDENT_AMBULATORY_CARE_PROVIDER_SITE_OTHER): Payer: Private Health Insurance - Indemnity | Admitting: Family Medicine

## 2011-01-29 ENCOUNTER — Encounter: Payer: Self-pay | Admitting: Family Medicine

## 2011-01-29 VITALS — BP 147/90 | HR 82 | Temp 97.7°F | Ht 62.0 in | Wt 206.0 lb

## 2011-01-29 DIAGNOSIS — F4321 Adjustment disorder with depressed mood: Secondary | ICD-10-CM

## 2011-01-29 DIAGNOSIS — I10 Essential (primary) hypertension: Secondary | ICD-10-CM

## 2011-01-29 DIAGNOSIS — F329 Major depressive disorder, single episode, unspecified: Secondary | ICD-10-CM

## 2011-01-29 DIAGNOSIS — F32A Depression, unspecified: Secondary | ICD-10-CM

## 2011-01-29 DIAGNOSIS — N390 Urinary tract infection, site not specified: Secondary | ICD-10-CM

## 2011-01-29 LAB — POCT URINALYSIS DIPSTICK
Bilirubin, UA: NEGATIVE
Nitrite, UA: NEGATIVE
Protein, UA: NEGATIVE
pH, UA: 7

## 2011-01-29 MED ORDER — OMEPRAZOLE 20 MG PO CPDR: 40.0000 mg | DELAYED_RELEASE_CAPSULE | Freq: Every day | ORAL | Status: DC

## 2011-01-29 MED ORDER — LISINOPRIL-HYDROCHLOROTHIAZIDE 20-25 MG PO TABS: 1.0000 | ORAL_TABLET | Freq: Every day | ORAL | Status: DC

## 2011-01-29 MED ORDER — DULOXETINE HCL 60 MG PO CPEP: 60.0000 mg | ORAL_CAPSULE | Freq: Every day | ORAL | Status: DC

## 2011-01-29 MED ORDER — CIPROFLOXACIN HCL 500 MG PO TABS: 500.0000 mg | ORAL_TABLET | Freq: Two times a day (BID) | ORAL | Status: AC

## 2011-01-29 MED ORDER — RIZATRIPTAN BENZOATE 10 MG PO TABS: 10.0000 mg | ORAL_TABLET | ORAL | Status: DC | PRN

## 2011-01-29 MED ORDER — DULOXETINE HCL 60 MG PO CPEP: 60.0000 mg | ORAL_CAPSULE | Freq: Two times a day (BID) | ORAL | Status: DC

## 2011-01-29 MED ORDER — CLONAZEPAM 0.5 MG PO TABS: 0.5000 mg | ORAL_TABLET | Freq: Every day | ORAL | Status: DC | PRN

## 2011-01-29 MED ORDER — PROPRANOLOL HCL ER 120 MG PO CP24: 120.0000 mg | ORAL_CAPSULE | Freq: Every day | ORAL | Status: DC

## 2011-01-29 NOTE — Progress Notes (Signed)
  Subjective:    Patient ID: Kristen Spence, female    DOB: [birth year], 51 y.o.   MRN: [id number]  HPI Hypertension-she is taking her medications regularly. No shortness of breath. She did have an episode of chest pain on Monday while she was sitting in the court room. Evidently one of the centers for her husband who has terminal cancer still money and medications from her. She says that she felt the stress, and it was getting to her. She has not had any chest pain since. She says she has not been taking care of herself where well for months. She admits she is eating poorly and not getting any exercise.  Buried her husband about 10 days ago. He had terminal cancer. She really misses him but also knows he is no longer suffering which does give her some relief. She is not doing increased counseling yet. She is still taking her Cymbalta but only once a day. She would like to go back up to twice a day and she would like to refill her Klonopin. She feels she will need it over the next couple of months and she is grieving for her husband. She does plan on starting back at work on Monday.  Dysuria for a couple of days.  + odor. No fever. No back pain. Did have a UTI earlier this year.      Review of Systems     Objective:   Physical Exam  Constitutional: She is oriented to person, place, and time. She appears well-developed and well-nourished.  HENT:  Head: Normocephalic and atraumatic.  Cardiovascular: Normal rate, regular rhythm and normal heart sounds.   Pulmonary/Chest: Effort normal and breath sounds normal.  Neurological: She is alert and oriented to person, place, and time.  Skin: Skin is warm and dry.  Psychiatric: She has a normal mood and affect. Her behavior is normal.          Assessment & Plan:  UTI- UA is + will tx with cipro. Call in one week if sxs not resolved.   HTN-not well controlled today. We increased her propranolol 220 mg. Follow up in one month to make sure  she's done well in nature it's not pushing her heart rate too low.  Depresion- exacerbated by the recent passing of her husband he had terminal cancer. will inc her cymbalta back to bid and refill her klonopin.  Followup in one month to make sure she is doing well. We also discussed getting into a hospice support groups she says she thinks she will do that.

## 2011-02-27 ENCOUNTER — Ambulatory Visit: Payer: Private Health Insurance - Indemnity | Admitting: Family Medicine

## 2011-03-04 ENCOUNTER — Encounter: Payer: Self-pay | Admitting: *Deleted

## 2011-03-09 ENCOUNTER — Ambulatory Visit (INDEPENDENT_AMBULATORY_CARE_PROVIDER_SITE_OTHER): Payer: Private Health Insurance - Indemnity | Admitting: Family Medicine

## 2011-03-09 ENCOUNTER — Encounter: Payer: Self-pay | Admitting: Family Medicine

## 2011-03-09 DIAGNOSIS — I10 Essential (primary) hypertension: Secondary | ICD-10-CM

## 2011-03-09 DIAGNOSIS — F329 Major depressive disorder, single episode, unspecified: Secondary | ICD-10-CM

## 2011-03-09 DIAGNOSIS — F32A Depression, unspecified: Secondary | ICD-10-CM

## 2011-03-09 MED ORDER — PROPRANOLOL HCL ER 160 MG PO CP24: 160.0000 mg | ORAL_CAPSULE | Freq: Every day | ORAL | Status: DC

## 2011-03-09 MED ORDER — ALPRAZOLAM 0.5 MG PO TABS: 0.5000 mg | ORAL_TABLET | Freq: Two times a day (BID) | ORAL | Status: DC | PRN

## 2011-03-09 NOTE — Progress Notes (Signed)
  Subjective:    Patient ID: Kristen Spence, female    DOB: 09/22/1960, 51 y.o.   MRN: [id number]  Hypertension This is a chronic problem. The current episode started more than 1 year ago. The problem is unchanged. The problem is controlled. Pertinent negatives include no chest pain or shortness of breath. There are no associated agents to hypertension. Risk factors for coronary artery disease include no known risk factors. Past treatments include ACE inhibitors and diuretics. There are no compliance problems.       Review of Systems  Respiratory: Negative for shortness of breath.   Cardiovascular: Negative for chest pain.       Objective:   Physical Exam  Constitutional: She is oriented to person, place, and time. She appears well-developed and well-nourished.  HENT:  Head: Normocephalic and atraumatic.  Cardiovascular: Normal rate, regular rhythm and normal heart sounds.   Pulmonary/Chest: Effort normal and breath sounds normal.  Neurological: She is alert and oriented to person, place, and time.  Skin: Skin is warm and dry.  Psychiatric: She has a normal mood and affect. Her behavior is normal.          Assessment & Plan:  HTN- Incrasee propranolol to 160mg . Recheck in 8 weeks.  Depression- Doing well on cymbalta. Would like to continue it.  F/u in 8 weeks.  Doing well on cymbalta dose. No change. Has a very stressful job.

## 2011-03-30 ENCOUNTER — Ambulatory Visit (INDEPENDENT_AMBULATORY_CARE_PROVIDER_SITE_OTHER): Payer: Private Health Insurance - Indemnity | Admitting: Physician Assistant

## 2011-03-30 ENCOUNTER — Encounter: Payer: Self-pay | Admitting: Physician Assistant

## 2011-03-30 VITALS — BP 145/86 | HR 85 | Temp 97.5°F | Ht 63.0 in | Wt 210.0 lb

## 2011-03-30 DIAGNOSIS — J329 Chronic sinusitis, unspecified: Secondary | ICD-10-CM

## 2011-03-30 DIAGNOSIS — R300 Dysuria: Secondary | ICD-10-CM

## 2011-03-30 LAB — POCT URINALYSIS DIPSTICK
Bilirubin, UA: NEGATIVE
Ketones, UA: NEGATIVE
Spec Grav, UA: 1.015
pH, UA: 7

## 2011-03-30 MED ORDER — AMOXICILLIN-POT CLAVULANATE 875-125 MG PO TABS: 1.0000 | ORAL_TABLET | Freq: Two times a day (BID) | ORAL | Status: AC

## 2011-03-30 MED ORDER — HYDROCODONE-HOMATROPINE 5-1.5 MG/5ML PO SYRP: 2.5000 mL | ORAL_SOLUTION | Freq: Every evening | ORAL | Status: AC | PRN

## 2011-03-30 NOTE — Progress Notes (Addendum)
  Subjective:    Patient ID: Kristen Spence, female    DOB: Sep 29, 1960, 51 y.o.   MRN: [id number]  Sinusitis This is a new problem. The current episode started 1 to 4 weeks ago. The problem has been gradually worsening since onset. The maximum temperature recorded prior to her arrival was 100 - 100.9 F. The fever has been present for less than 1 day. Her pain is at a severity of 9/10. The pain is severe. Associated symptoms include chills, congestion, coughing, ear pain, headaches, sinus pressure, sneezing, a sore throat and swollen glands. Pertinent negatives include no diaphoresis or shortness of breath. Past treatments include acetaminophen and oral decongestants. The treatment provided no relief.   She has also had some pain with urination for 2 days. She denies any vaginal discharge.    Review of Systems  Constitutional: Positive for chills. Negative for diaphoresis.  HENT: Positive for ear pain, congestion, sore throat, sneezing and sinus pressure.   Respiratory: Positive for cough. Negative for shortness of breath.   Neurological: Positive for headaches.       Objective:   Physical Exam  Constitutional: She is oriented to person, place, and time. She appears well-developed and well-nourished.  HENT:  Head: Normocephalic and atraumatic.  Right Ear: External ear normal.  Left Ear: External ear normal.       Maxillary tenderness bilaterally.TM's normal. Oropharynx erythematous with post nasal drip. Swollen turbinates.  Eyes: Conjunctivae are normal.  Neck: Normal range of motion. Neck supple.       Tender bilateral cervical enlarged lymph nodes with tenderness to palpation.  Lymphadenopathy:    She has cervical adenopathy.  Neurological: She is alert and oriented to person, place, and time.  Psychiatric: She has a normal mood and affect. Her behavior is normal.          Assessment & Plan:  Sinusitis-Augmentin to start twice a day for 10 days. Hycodan for cough to use at  night. Should start some type of anti-histamine daily after sinus infection clears. Continue tylenol and motrin as needed for pain, headache, and sore throat. Gave handout on sore throat. Patient should call if not improving or worsening in 3-4 days or by Friday.  Dysuria-UA(postive for blood) Once she is feeling better we need to recheck urine. It is not infected;however, it is not normal to have blood in urine. She has had a hysterectomy.   Elevated blood pressure- Discussed with patient to not take anything else OTC with decongestants in it. Check blood pressure after feeling better and if still elevated needs to be seen and discuss blood pressure elevation.

## 2011-03-30 NOTE — Patient Instructions (Addendum)
Augmentin to start twice a day for 10 days. Hycodan for cough to use at night. Should start some type of anti-histamine daily after sinus infection clears. Continue tylenol and motrin as needed for pain, headache, and sore throat.   Sore Throat Sore throats may be caused by bacteria and viruses. They may also be caused by:  Smoking.   Pollution.   Allergies.  If a sore throat is due to strep infection (a bacterial infection), you may need:  A throat swab.   A culture test to verify the strep infection.  You will need one of these:  An antibiotic shot.   Oral medicine for a full 10 days.  Strep infection is very contagious. A doctor should check any close contacts who have a sore throat or fever. A sore throat caused by a virus infection will usually last only 3-4 days. Antibiotics will not treat a viral sore throat.  Infectious mononucleosis (a viral disease), however, can cause a sore throat that lasts for up to 3 weeks. Mononucleosis can be diagnosed with blood tests. You must have been sick for at least 1 week in order for the test to give accurate results. HOME CARE INSTRUCTIONS   To treat a sore throat, take mild pain medicine.   Increase your fluids.   Eat a soft diet.   Do not smoke.   Gargling with warm water or salt water (1 tsp. salt in 8 oz. water) can be helpful.   Try throat sprays or lozenges or sucking on hard candy to ease the symptoms.  Call your doctor if your sore throat lasts longer than 1 week.  SEEK IMMEDIATE MEDICAL CARE IF:  You have difficulty breathing.   You have increased swelling in the throat.   You have pain so severe that you are unable to swallow fluids or your saliva.   You have a severe headache, a high fever, vomiting, or a red rash.  Document Released: 02/13/2004 Document Revised: 12/25/2010 Document Reviewed: 12/23/2006 [facility] Patient Information 2012 [place], [place].

## 2011-05-05 ENCOUNTER — Ambulatory Visit: Payer: Private Health Insurance - Indemnity | Admitting: Family Medicine

## 2011-05-05 DIAGNOSIS — Z0289 Encounter for other administrative examinations: Secondary | ICD-10-CM

## 2011-06-30 ENCOUNTER — Encounter: Payer: Self-pay | Admitting: Family Medicine

## 2011-06-30 ENCOUNTER — Ambulatory Visit (INDEPENDENT_AMBULATORY_CARE_PROVIDER_SITE_OTHER): Payer: Private Health Insurance - Indemnity | Admitting: Family Medicine

## 2011-06-30 VITALS — BP 135/86 | HR 81 | Temp 98.0°F | Ht 62.0 in | Wt 210.0 lb

## 2011-06-30 DIAGNOSIS — R5381 Other malaise: Secondary | ICD-10-CM

## 2011-06-30 DIAGNOSIS — M2550 Pain in unspecified joint: Secondary | ICD-10-CM

## 2011-06-30 DIAGNOSIS — R110 Nausea: Secondary | ICD-10-CM

## 2011-06-30 DIAGNOSIS — R5383 Other fatigue: Secondary | ICD-10-CM

## 2011-06-30 DIAGNOSIS — Z8639 Personal history of other endocrine, nutritional and metabolic disease: Secondary | ICD-10-CM

## 2011-06-30 DIAGNOSIS — Z862 Personal history of diseases of the blood and blood-forming organs and certain disorders involving the immune mechanism: Secondary | ICD-10-CM

## 2011-06-30 MED ORDER — ONDANSETRON 4 MG PO TBDP: 4.0000 mg | ORAL_TABLET | Freq: Three times a day (TID) | ORAL | Status: AC | PRN

## 2011-06-30 NOTE — Patient Instructions (Signed)
We will call you with your lab results. If you don't here from us in about a week then please give us a call at [phone].  

## 2011-06-30 NOTE — Progress Notes (Signed)
Subjective:    Patient ID: Kristen Spence, female    DOB: [birth year], 51 y.o.   MRN: [id number]  HPI Says her body hurts. Started 1 month ago. Says her bones hurt.  Says fatigued is gradually getting worse. Sleeping well.  Says Friday had to leave work.  Occ dizzy spells. Hx of iron def anemia.  No blood in the stool.  No periods.  Says feel really nauseated and some diarrhea for the last 2 weeks.  Nothing tastes good. Says has a bad taste in her mouth. Has some heartburn.  Yesterday felt dizzy like might pass out. Diarrhea is triggered by eating.  Feels sleepy when eats.  No fever. No skin or hair changes.  No recent ABX. Hx of cholecystectomy. Has ben retaining fluid.  Says eating low salt diet. Hs been gaining weight. She fell asleep in the sun over the weekend and got sunburn.  Review of Systems BP 135/86  Pulse 81  Temp 98 F (36.7 C)  Ht 5\' 2"  (1.575 m)  Wt 210 lb (95.255 kg)  BMI 38.41 kg/m2  SpO2 96%    Allergies  Allergen Reactions  . Clonazepam Other (See Comments)    Makes her feel weird.     Past Medical History  Diagnosis Date  . Heart palpitations   . Hiatal hernia   . Edema   . Insomnia   . Dizziness   . Migraine   . Hypertension   . Fatigue   . Benign positional vertigo   . GERD (gastroesophageal reflux disease)     hiatal hernia    Past Surgical History  Procedure Date  . Cholecystectomy 1987  . Cesarean section 1982  . Breast lumpectomy 2000   . Endometrial ablation 09/30/2006  . Total vaginal hysterectomy 09/27/2008    Dr. Lavon Paganini  . Esophagogastroduodenoscopy     Showed grade D. reflux esophagitis    History   Social History  . Marital Status: Married    Spouse Name: bobby     Number of Children: 2  . Years of Education: 10th grade   Occupational History  . Customer service rep [employer]   Social History Main Topics  . Smoking status: Never Smoker   . Smokeless tobacco: Not on file  . Alcohol Use: No  . Drug Use: No   . Sexually Active: Not on file   Other Topics Concern  . Not on file   Social History Narrative   No regular exercise    Family History  Problem Relation Age of Onset  . Alcohol abuse Father   . Stroke Father   . Hypertension Father   . Ovarian cancer Mother   . Depression Mother   . Diabetes Mother   . Hyperlipidemia Mother   . Hypertension Mother   . COPD Mother   . Heart attack Sister 68  . Breast cancer Sister     Outpatient Encounter Prescriptions as of 06/30/2011  Medication Sig Dispense Refill  . ALPRAZolam (XANAX) 0.5 MG tablet Take 1 tablet (0.5 mg total) by mouth 2 (two) times daily as needed.  45 tablet  1  . DULoxetine (CYMBALTA) 60 MG capsule Take 1 capsule (60 mg total) by mouth 2 (two) times daily.  180 capsule  2  . lisinopril-hydrochlorothiazide (PRINZIDE,ZESTORETIC) 20-25 MG per tablet Take 1 tablet by mouth daily.  90 tablet  2  . omeprazole (PRILOSEC) 20 MG capsule Take 2 capsules (40 mg total) by mouth daily.  180  capsule  3  . propranolol (INDERAL LA) 160 MG SR capsule Take 1 capsule (160 mg total) by mouth daily.  90 capsule  1  . rizatriptan (MAXALT) 10 MG tablet Take 1 tablet (10 mg total) by mouth as needed. May repeat in 2 hours if needed  12 tablet  11  . zolpidem (AMBIEN) 10 MG tablet Take 10 mg by mouth at bedtime as needed.        . ondansetron (ZOFRAN ODT) 4 MG disintegrating tablet Take 1 tablet (4 mg total) by mouth every 8 (eight) hours as needed for nausea.  20 tablet  0  . DISCONTD: venlafaxine (EFFEXOR-XR) 150 MG 24 hr capsule Take 150 mg by mouth daily.                Objective:   Physical Exam  Constitutional: She is oriented to person, place, and time. She appears well-developed and well-nourished.  HENT:  Head: Normocephalic and atraumatic.  Neck: Neck supple.       Mildly enlarged right cervical lymph node. Borderline enlarged thyroid gland.  Cardiovascular: Normal rate, regular rhythm and normal heart sounds.     Pulmonary/Chest: Effort normal and breath sounds normal.  Lymphadenopathy:    She has cervical adenopathy.  Neurological: She is alert and oriented to person, place, and time.  Skin: Skin is warm and dry.       She is feeling skin on her forearms and lower chin the teeth and upper chest.  Psychiatric: She has a normal mood and affect. Her behavior is normal.          Assessment & Plan:  Fatigue - Check CBC, TSH,  CMP and ferritin, and B12.  She does have a history of iron deficiency anemia. She did not respond to oral medications and required IV infusion therapy. She is also struggling with weight gain, thus we will check a thyroid level. Her blood pressure is well-controlled today. Consider thyroid ultrasound for further evaluation if her levels are normal. Mammogram is up-to-date. She has had a hysterectomy and cholecystectomy. she still has one ovary. Consider viral etiology thought no fever or other URI sxs.    Polyathralgia - consider further w/u with sed rate etc if labs are all normal and sxs persist.    Nausea-will give prescription for Zofran to use as needed. If blood work is all completely normal to consider reflux medication for possible GERD or gastritis.

## 2011-07-01 ENCOUNTER — Other Ambulatory Visit: Payer: Self-pay | Admitting: Family Medicine

## 2011-07-01 DIAGNOSIS — R197 Diarrhea, unspecified: Secondary | ICD-10-CM

## 2011-07-01 LAB — COMPLETE METABOLIC PANEL WITH GFR
Albumin: 4.1 g/dL (ref 3.5–5.2)
Alkaline Phosphatase: 79 U/L (ref 39–117)
GFR, Est Non African American: >89 mL/min
Glucose, Bld: 82 mg/dL (ref 70–99)
Potassium: 4 mEq/L (ref 3.5–5.3)
Sodium: 140 mEq/L (ref 135–145)
Total Protein: 7 g/dL (ref 6.0–8.3)

## 2011-07-01 LAB — CBC WITH DIFFERENTIAL/PLATELET
Basophils Relative: 0 % (ref 0–1)
Eosinophils Absolute: 0.1 10*3/uL (ref 0.0–0.7)
Eosinophils Relative: 2 % (ref 0–5)
HCT: 41.2 % (ref 36.0–46.0)
Hemoglobin: 13.9 g/dL (ref 12.0–15.0)
MCH: 30.2 pg (ref 26.0–34.0)
MCHC: 33.7 g/dL (ref 30.0–36.0)
MCV: 89.6 fL (ref 78.0–100.0)
Monocytes Absolute: 0.6 10*3/uL (ref 0.1–1.0)
Monocytes Relative: 7 % (ref 3–12)
Neutro Abs: 6.1 10*3/uL (ref 1.7–7.7)

## 2011-07-01 LAB — IRON: Iron: 67 ug/dL (ref 42–145)

## 2011-07-27 ENCOUNTER — Other Ambulatory Visit: Payer: Self-pay | Admitting: *Deleted

## 2011-07-27 MED ORDER — ALPRAZOLAM 0.5 MG PO TABS: 0.5000 mg | ORAL_TABLET | Freq: Two times a day (BID) | ORAL | Status: DC | PRN

## 2011-08-25 ENCOUNTER — Other Ambulatory Visit: Payer: Self-pay | Admitting: Family Medicine

## 2011-08-26 NOTE — Telephone Encounter (Signed)
If having sxs needs appt.  cna put on Jades schedule. Has been over a year since rx this for her.

## 2011-08-27 ENCOUNTER — Encounter: Payer: Self-pay | Admitting: Family Medicine

## 2011-08-27 ENCOUNTER — Ambulatory Visit (INDEPENDENT_AMBULATORY_CARE_PROVIDER_SITE_OTHER): Payer: Private Health Insurance - Indemnity | Admitting: Family Medicine

## 2011-08-27 VITALS — BP 142/93 | HR 59 | Ht 62.0 in | Wt 214.0 lb

## 2011-08-27 DIAGNOSIS — F329 Major depressive disorder, single episode, unspecified: Secondary | ICD-10-CM

## 2011-08-27 DIAGNOSIS — E669 Obesity, unspecified: Secondary | ICD-10-CM

## 2011-08-27 DIAGNOSIS — B3789 Other sites of candidiasis: Secondary | ICD-10-CM

## 2011-08-27 DIAGNOSIS — I10 Essential (primary) hypertension: Secondary | ICD-10-CM

## 2011-08-27 DIAGNOSIS — F32A Depression, unspecified: Secondary | ICD-10-CM

## 2011-08-27 MED ORDER — FLUCONAZOLE 150 MG PO TABS: 150.0000 mg | ORAL_TABLET | Freq: Once | ORAL | Status: DC

## 2011-08-27 MED ORDER — PROPRANOLOL HCL ER 160 MG PO CP24: 160.0000 mg | ORAL_CAPSULE | Freq: Every day | ORAL | Status: DC

## 2011-08-27 MED ORDER — NYSTATIN 100000 UNIT/GM EX POWD: CUTANEOUS | Status: DC

## 2011-08-27 MED ORDER — LISINOPRIL-HYDROCHLOROTHIAZIDE 20-12.5 MG PO TABS: 1.0000 | ORAL_TABLET | Freq: Two times a day (BID) | ORAL | Status: DC

## 2011-08-27 MED ORDER — ALPRAZOLAM 0.5 MG PO TABS: 0.5000 mg | ORAL_TABLET | Freq: Two times a day (BID) | ORAL | Status: DC | PRN

## 2011-08-27 NOTE — Progress Notes (Signed)
Subjective:    Patient ID: Kristen Spence, female    DOB: [birth year], 51 y.o.   MRN: [id number]  HPI HTN - doing well.  Says really wants to work on her weight loss. Has been eating sporadically bc of stress.  She is now home alone.  No CP or SOB.   Depression-she's doing very well. She's on her Cymbalta. She does use her Xanax as needed she does not use it on a daily basis. She says sometimes she wheezes and help her sleep or she's had a really stressful day at work and just feels overly emotional she will take one. She is due for refill.  She's also had a rash under both breasts and under her pannus. She has had it before we sent a prescription for Diflucan in the past that worked very well for her. She's been just using some ointment and try to keep it dry for now. She actually called in for refill that we had denied it initially. She says it does sometimes get an earlier and feel sore at times.   She also really wants to talk about losing weight. She says she's had been this way it is now noticing some pain in her legs and this is from being overweight. She has started walking a little recently but not consistently. No major changes to her diet. Review of Systems     Objective:   Physical Exam  Constitutional: She is oriented to person, place, and time. She appears well-developed and well-nourished.       She is obese.  HENT:  Head: Normocephalic and atraumatic.  Cardiovascular: Normal rate, regular rhythm and normal heart sounds.   Pulmonary/Chest: Effort normal and breath sounds normal.  Neurological: She is alert and oriented to person, place, and time.  Skin: Skin is warm and dry.       She has an erythematous somewhat macerated rash underneath the pannus in both breasts. Is well demarcated. An infectious some hyperpigmentation of the skin.  Psychiatric: She has a normal mood and affect. Her behavior is normal.          Assessment & Plan:  HTN - improving but not well  controlled. We discussed different options. We will try increasing her lisinopril to 40 mg daily. Did do that we will change her lisinopril HCT to 20/12.5 and increased to twice a day. Followup in one month to make sure at goal. Continue to work on diet and regular exercise and weight loss.  Obesity - we discussed several different options and directions to go to help her with her weight. At this point time she said she would be interested in seeing a bariatric position such as Dr. Seymour Bars here in town. She wants to check with her insurance first to make sure that it is covered. If not then she may be interested in doing something like phentermine with me once her blood pressure is well-controlled. I also give her some information to look up my fitness PAL which is an application for Smart phone. This helps her keep up with how many calories she's been taking as well as her exercise. If we can get her obesity and her control but I really think her blood pressure would improve.    candidia under breast and pannus-  We usually treat this with a nystatin powder which is an antifungal. Though she has used Diflucan orally in the past and has worked Insurance account manager. Thus I gave her both to use  today.  Depression-she's doing very well overall. I did go ahead and refill her Xanax today. Reminded her again to use it sparingly which she is Re: doing. This is great. Overall sleep is fair.

## 2011-08-27 NOTE — Patient Instructions (Addendum)
[employer]itnessPal app for the smart phone Work up 30 minutes of aerobic activity 5 days.

## 2011-09-24 ENCOUNTER — Encounter: Payer: Self-pay | Admitting: Family Medicine

## 2011-09-24 ENCOUNTER — Ambulatory Visit: Payer: Private Health Insurance - Indemnity | Admitting: Family Medicine

## 2011-09-24 ENCOUNTER — Ambulatory Visit (INDEPENDENT_AMBULATORY_CARE_PROVIDER_SITE_OTHER): Payer: Private Health Insurance - Indemnity | Admitting: Family Medicine

## 2011-09-24 VITALS — BP 159/99 | HR 76 | Temp 97.6°F | Wt 216.0 lb

## 2011-09-24 DIAGNOSIS — R3915 Urgency of urination: Secondary | ICD-10-CM

## 2011-09-24 DIAGNOSIS — J329 Chronic sinusitis, unspecified: Secondary | ICD-10-CM

## 2011-09-24 DIAGNOSIS — A499 Bacterial infection, unspecified: Secondary | ICD-10-CM

## 2011-09-24 DIAGNOSIS — R110 Nausea: Secondary | ICD-10-CM

## 2011-09-24 DIAGNOSIS — N390 Urinary tract infection, site not specified: Secondary | ICD-10-CM

## 2011-09-24 LAB — POCT URINALYSIS DIPSTICK
Glucose, UA: NEGATIVE
Nitrite, UA: POSITIVE
Urobilinogen, UA: 0.2

## 2011-09-24 MED ORDER — FLUTICASONE PROPIONATE 50 MCG/ACT NA SUSP: 2.0000 | Freq: Every day | NASAL | Status: DC

## 2011-09-24 MED ORDER — CIPROFLOXACIN HCL 250 MG PO TABS: ORAL_TABLET | ORAL | Status: AC

## 2011-09-24 MED ORDER — PROMETHAZINE HCL 25 MG PO TABS: 25.0000 mg | ORAL_TABLET | Freq: Four times a day (QID) | ORAL | Status: DC | PRN

## 2011-09-24 MED ORDER — AMOXICILLIN-POT CLAVULANATE 500-125 MG PO TABS: ORAL_TABLET | ORAL | Status: AC

## 2011-09-24 NOTE — Progress Notes (Signed)
CC: Kristen Spence is a 51 y.o. female is here for Otalgia and Urinary Urgency   Subjective: HPI: Acute care visit for multiple complaints:  Problem #1 for the past week she's had ear fullness and pain bilaterally getting worse a daily basis, on Monday she began to have facial pressure located above both eyes it's worse when leaning forward or with exertion. No interventions at home as of yet due to fears of rising her blood pressure. Associated with nasal drainage, postnasal drip. Denies fevers, chills, cough, shortness of breath, chest congestion, motor or sensory disturbances. She does mention this is making her dizzy and this is exacerbating her chronic nausea.  Problem #2 she woke up this morning with a frequent sense of urinary urgency. She's worried that this feels like past urinary tract infections. She states there is an odor to her urine but no change in color or consistency of her urine. She denies flank pain or back pain nor abdominal pain. She no longer menstruates. She denies polyphasia or polydipsia.  Problem #3 she states that she has chronic nausea which is well-maintained on promethazine. She feels that either the postnasal drip or the urinary urgency and possibly UTI is worsening her nausea. She's able to keep fluids and foods down. She is requesting a refill on her promethazine. Again she denies abdominal pain or GERD-like symptoms   Review Of Systems Outlined In HPI  Past Medical History  Diagnosis Date  . Heart palpitations   . Hiatal hernia   . Edema   . Insomnia   . Dizziness   . Migraine   . Hypertension   . Fatigue   . Benign positional vertigo   . GERD (gastroesophageal reflux disease)     hiatal hernia     Family History  Problem Relation Age of Onset  . Alcohol abuse Father   . Stroke Father   . Hypertension Father   . Ovarian cancer Mother   . Depression Mother   . Diabetes Mother   . Hyperlipidemia Mother   . Hypertension Mother   . COPD  Mother   . Heart attack Sister 41  . Breast cancer Sister      History  Substance Use Topics  . Smoking status: Never Smoker   . Smokeless tobacco: Not on file  . Alcohol Use: No     Objective: Filed Vitals:   09/24/11 1355  BP: 159/99  Pulse: 76  Temp: 97.6 F (36.4 C)    General: Alert and Oriented, No Acute Distress HEENT: Pupils equal, round, reactive to light. Conjunctivae clear.  External ears unremarkable, canals clear with intact TMs with appropriate landmarks.  Middle ear with serous effusions bilaterally. Pink inferior turbinates.  Moist mucous membranes, pharynx without inflammation nor lesions.  Neck supple without palpable lymphadenopathy nor abnormal masses. Mild frontal tenderness to percussion. Lungs: Clear to auscultation bilaterally, no wheezing/ronchi/rales.  Comfortable work of breathing. Good air movement. Cardiac: Regular rate and rhythm. Normal S1/S2.  No murmurs, rubs, nor gallops.   Abdomen: soft and non tender without palpable masses. Extremities: No peripheral edema.  Strong peripheral pulses.  Mental Status: No depression, anxiety, nor agitation. Skin: Warm and dry.  Assessment & Plan: Kristen Spence was seen today for otalgia and urinary urgency.  Diagnoses and associated orders for this visit:  Urinary urgency - Urinalysis Dipstick - Urine culture  Bacterial sinusitis - amoxicillin-clavulanate (AUGMENTIN) 500-125 MG per tablet; Take one by mouth every 8 hours for ten total days.  Uti (lower  urinary tract infection) - ciprofloxacin (CIPRO) 250 MG tablet; Take one by mouth twice a day for five days.  Nausea - promethazine (PHENERGAN) 25 MG tablet; Take 1 tablet (25 mg total) by mouth every 6 (six) hours as needed for nausea.  Other Orders - fluticasone (FLONASE) 50 MCG/ACT nasal spray; Place 2 sprays into the nose daily.    Start nasal saline sprays and Flonase for suspected eustachian tube dysfunction most likely secondary to mucosal  inflammation. Discussed probabilities would suggest viral infections the culprit this time however if fever occurs, rebound improvement/deterioration, for worsening symptoms over 7 to 10 days and Augmentin would be beneficial for likely bacterial infection. Urinalysis was nitrate and blood suggestive of UTI start Cipro today we'll follow culture. Refilled promethazine per her request  Return if symptoms worsen or fail to improve.  Requested Prescriptions   Signed Prescriptions Disp Refills  . ciprofloxacin (CIPRO) 250 MG tablet 10 tablet 0    Sig: Take one by mouth twice a day for five days.  Marland Kitchen amoxicillin-clavulanate (AUGMENTIN) 500-125 MG per tablet 30 tablet 0    Sig: Take one by mouth every 8 hours for ten total days.  . fluticasone (FLONASE) 50 MCG/ACT nasal spray 16 g 2    Sig: Place 2 sprays into the nose daily.  . promethazine (PHENERGAN) 25 MG tablet 60 tablet 1    Sig: Take 1 tablet (25 mg total) by mouth every 6 (six) hours as needed for nausea.

## 2011-09-27 LAB — URINE CULTURE

## 2011-09-28 ENCOUNTER — Telehealth: Payer: Self-pay | Admitting: Family Medicine

## 2011-09-28 MED ORDER — CEPHALEXIN 500 MG PO CAPS: ORAL_CAPSULE | ORAL | Status: AC

## 2011-09-28 NOTE — Telephone Encounter (Signed)
Urine culture resistant to cipro rx, notified patient and called in keflex.

## 2011-12-28 ENCOUNTER — Encounter: Payer: Self-pay | Admitting: Family Medicine

## 2011-12-28 ENCOUNTER — Ambulatory Visit (INDEPENDENT_AMBULATORY_CARE_PROVIDER_SITE_OTHER): Payer: Private Health Insurance - Indemnity | Admitting: Family Medicine

## 2011-12-28 VITALS — BP 147/82 | HR 75 | Wt 223.0 lb

## 2011-12-28 DIAGNOSIS — H6980 Other specified disorders of Eustachian tube, unspecified ear: Secondary | ICD-10-CM

## 2011-12-28 DIAGNOSIS — L909 Atrophic disorder of skin, unspecified: Secondary | ICD-10-CM

## 2011-12-28 DIAGNOSIS — H6990 Unspecified Eustachian tube disorder, unspecified ear: Secondary | ICD-10-CM | POA: Insufficient documentation

## 2011-12-28 DIAGNOSIS — L918 Other hypertrophic disorders of the skin: Secondary | ICD-10-CM

## 2011-12-28 MED ORDER — BECLOMETHASONE DIPROPIONATE 80 MCG/ACT NA AERS: 2.0000 | INHALATION_SPRAY | Freq: Every day | NASAL | Status: DC

## 2011-12-28 MED ORDER — RIZATRIPTAN BENZOATE 10 MG PO TABS: 10.0000 mg | ORAL_TABLET | ORAL | Status: DC | PRN

## 2011-12-28 NOTE — Progress Notes (Signed)
CC: Kristen Spence is a 51 y.o. female is here for skin tags and ears feel full   Subjective: HPI:  Patient complaint of bilateral ear fullness, with frequent popping sensation. She describes this fullness is a pressure sensation, nothing seems to make it better or worse. It's present 24 hours a day does not interfere with her sleep. No interventions as of yet. She denies hearing loss, dizziness, ear pain, ear discharge, nor upper respiratory complaints  She has some scattered skin tags around her neck that become painful when she wears collared shirts, they often get irritated and red however they've not bled. Discomfort is moderate in severity. No interventions as of yet she also has a lesion underneath her left eye that she would like removed today  She requests a refill of her Maxalt  Review Of Systems Outlined In HPI  Past Medical History  Diagnosis Date  . Heart palpitations   . Hiatal hernia   . Edema   . Insomnia   . Dizziness   . Migraine   . Hypertension   . Fatigue   . Benign positional vertigo   . GERD (gastroesophageal reflux disease)     hiatal hernia     Family History  Problem Relation Age of Onset  . Alcohol abuse Father   . Stroke Father   . Hypertension Father   . Ovarian cancer Mother   . Depression Mother   . Diabetes Mother   . Hyperlipidemia Mother   . Hypertension Mother   . COPD Mother   . Heart attack Sister 19  . Breast cancer Sister      History  Substance Use Topics  . Smoking status: Never Smoker   . Smokeless tobacco: Not on file  . Alcohol Use: No     Objective: Filed Vitals:   12/28/11 1633  BP: 147/82  Pulse: 75    General: Alert and Oriented, No Acute Distress HEENT: Pupils equal, round, reactive to light. Conjunctivae clear.  External ears unremarkable, canals clear with intact TMs with appropriate landmarks.  Middle ear appears open without effusion. Pink inferior turbinates.  Moist mucous membranes, pharynx without  inflammation nor lesions.  Neck supple without palpable lymphadenopathy nor abnormal masses. Lungs: Comfortable work of breathing. Good air movement. Cardiac: Regular rate and rhythm. Skin: Warm and dry. She has 9 skin tags around the All of which appear irritated. She has a cauliflower-like lesion underneath her left eye that has a warty appearance  Assessment & Plan: Dari was seen today for skin tags and ears feel full.  Diagnoses and associated orders for this visit:  Skin tag - Ambulatory referral to Dermatology  Eustachian tube dysfunction  Other Orders - Beclomethasone Dipropionate (QNASL) 80 MCG/ACT AERS; Place 2 sprays into the nose daily. 2 sprays each nostirl - rizatriptan (MAXALT) 10 MG tablet; Take 1 tablet (10 mg total) by mouth as needed. May repeat in 2 hours if needed    Discussed with the patient I believe her ear discomfort is due to eustachian tube dysfunction, she's tried Nasonex in the past without much improvement, I provided her with a sample of qnasl to try for the next 2 weeks. I have sent a curettage referral for removal of the lesion under her eye  Skin Tag Removal Procedure Note  Pre-operative Diagnosis: Classic skin tags (acrochordon)  Post-operative Diagnosis: Classic skin tags (acrochordon)  Locations: Right and left lateral neck  Indications: Pain and irritation  Anesthesia topical cold spray  Procedure Details  The risks (including bleeding and infection) and benefits of the procedure and Verbal informed consent obtained. Using sterile iris scissors, multiple skin tags were snipped off at their bases after cleansing with chlorhexadine.  Bleeding was controlled by pressure and drysol .   Findings: Pathognomonic benign lesions  not sent for pathological exam.  Condition: Stable  Complications: none.  Plan: 1. Instructed to keep the wounds dry and covered for 24-48h and clean thereafter. 2. Warning signs of infection were reviewed.    3. Recommended that the patient use OTC analgesics as needed for pain.  4. Return as needed.  Return if symptoms worsen or fail to improve.

## 2012-02-04 ENCOUNTER — Ambulatory Visit: Payer: Private Health Insurance - Indemnity | Admitting: Sports Medicine

## 2012-02-18 ENCOUNTER — Telehealth: Payer: Self-pay | Admitting: *Deleted

## 2012-02-18 MED ORDER — OSELTAMIVIR PHOSPHATE 75 MG PO CAPS: 75.0000 mg | ORAL_CAPSULE | Freq: Every day | ORAL | Status: DC

## 2012-02-18 NOTE — Telephone Encounter (Signed)
Pt calls & states that her son & both of her grandchildren have all had the flu & she has been in direct contact with all of them.  She is asking for a prescription for tamiflu.  Please advise

## 2012-02-18 NOTE — Telephone Encounter (Signed)
Will send over prophylactic dosing.

## 2012-02-19 NOTE — Telephone Encounter (Signed)
Informed pt of prescription sent.  Left vm

## 2012-02-25 ENCOUNTER — Encounter: Payer: Self-pay | Admitting: Family Medicine

## 2012-02-25 ENCOUNTER — Telehealth: Payer: Self-pay | Admitting: *Deleted

## 2012-02-25 ENCOUNTER — Encounter (INDEPENDENT_AMBULATORY_CARE_PROVIDER_SITE_OTHER): Payer: Private Health Insurance - Indemnity | Admitting: Family Medicine

## 2012-02-25 NOTE — Telephone Encounter (Addendum)
Pt brought in form to be filled out for biometric screen for her insurance co. Pt needs a lipid panel to document on the form.  Pt was advised that since she is being seen today for an acute visit and not for a physical her insurance may not pay for the lipid panel. The form did not require a physical so I did fill in her BP however insurance co will pay for labs if done with a physical.Pt states she told the scheduler that she wanted to have this combined. I advised her that today's visit was for an acute visit and explained to her that the time allotted was for her sinus issues and not for a biometric screen. Explained again that I can put in an order for lipid panel but it may not be paid for because we cannot code as if she were being seen for CPE when she is being seen for sinusitis. Pt agreed and voiced understanding. A few moments later she left the office stating she had to return to work because she was on her lunch break.

## 2012-02-26 ENCOUNTER — Emergency Department (INDEPENDENT_AMBULATORY_CARE_PROVIDER_SITE_OTHER)
Admission: EM | Admit: 2012-02-26 | Discharge: 2012-02-26 | Disposition: A | Discharge status: Home / Self Care | Payer: Private Health Insurance - Indemnity | Source: Home / Self Care | Attending: Family Medicine | Admitting: Family Medicine

## 2012-02-26 ENCOUNTER — Encounter: Payer: Self-pay | Admitting: *Deleted

## 2012-02-26 DIAGNOSIS — J069 Acute upper respiratory infection, unspecified: Secondary | ICD-10-CM

## 2012-02-26 DIAGNOSIS — J329 Chronic sinusitis, unspecified: Secondary | ICD-10-CM

## 2012-02-26 MED ORDER — AZITHROMYCIN 250 MG PO TABS: ORAL_TABLET | ORAL | Status: DC

## 2012-02-26 MED ORDER — BENZONATATE 100 MG PO CAPS: 100.0000 mg | ORAL_CAPSULE | Freq: Three times a day (TID) | ORAL | Status: DC | PRN

## 2012-02-26 MED ORDER — HYDROCODONE-ACETAMINOPHEN 5-500 MG PO TABS: 1.0000 | ORAL_TABLET | Freq: Four times a day (QID) | ORAL | Status: DC | PRN

## 2012-02-26 NOTE — Progress Notes (Signed)
This encounter was created in error - please disregard.

## 2012-02-26 NOTE — ED Notes (Signed)
Patient c/o 2-3 day hx of sinus pain, HA, drainage, ear pain and intermittent fever. T-max 102

## 2012-02-26 NOTE — ED Provider Notes (Signed)
History     CSN: [id number]  Arrival date & time 02/26/12  1857   First MD Initiated Contact with Patient 02/26/12 1907      Chief Complaint  Patient presents with  . Sinus Problem  . URI   HPI  URI Symptoms Onset: 3-4 days  Description: nasal congestion, sinus pressure, fevers, rhinorrhea, nasal congestion.  Modifying factors:  Had ? Flu exposure, on ppx tamiflu   Symptoms Nasal discharge: yes Fever: yes Sore throat: no Cough: mild Wheezing: no Ear pain: no GI symptoms: no Sick contacts: yes  Red Flags  Stiff neck: no Dyspnea: no Rash: no Swallowing difficulty: no  Sinusitis Risk Factors Headache/face pain: yes Double sickening: possible tooth pain: no  Allergy Risk Factors Sneezing: no Itchy scratchy throat: no Seasonal symptoms: no  Flu Risk Factors Headache: no muscle aches: mild severe fatigue: no   Past Medical History  Diagnosis Date  . Heart palpitations   . Hiatal hernia   . Edema   . Insomnia   . Dizziness   . Migraine   . Hypertension   . Fatigue   . Benign positional vertigo   . GERD (gastroesophageal reflux disease)     hiatal hernia    Past Surgical History  Procedure Date  . Cholecystectomy 1987  . Cesarean section 1982  . Breast lumpectomy 2000   . Endometrial ablation 09/30/2006  . Total vaginal hysterectomy 09/27/2008    Dr. Lavon Paganini  . Esophagogastroduodenoscopy     Showed grade D. reflux esophagitis    Family History  Problem Relation Age of Onset  . Alcohol abuse Father   . Stroke Father   . Hypertension Father   . Ovarian cancer Mother   . Depression Mother   . Diabetes Mother   . Hyperlipidemia Mother   . Hypertension Mother   . COPD Mother   . Heart attack Sister 37  . Breast cancer Sister     History  Substance Use Topics  . Smoking status: Never Smoker   . Smokeless tobacco: Never Used  . Alcohol Use: No    OB History    Grav Para Term Preterm Abortions TAB SAB Ect Mult Living             Review of Systems  All other systems reviewed and are negative.    Allergies  Banana and Clonazepam  Home Medications   Current Outpatient Rx  Name  Route  Sig  Dispense  Refill  . ALPRAZOLAM 0.5 MG PO TABS   Oral   Take 1 tablet (0.5 mg total) by mouth 2 (two) times daily as needed.   45 tablet   1   . AZITHROMYCIN 250 MG PO TABS      Take 2 tabs PO x 1 dose, then 1 tab PO QD x 4 days   6 tablet   0   . BECLOMETHASONE DIPROPIONATE 80 MCG/ACT NA AERS   Nasal   Place 2 sprays into the nose daily. 2 sprays each nostirl   1 Inhaler   0   . DULOXETINE HCL 60 MG PO CPEP   Oral   Take 1 capsule (60 mg total) by mouth 2 (two) times daily.   180 capsule   2   . FLUTICASONE PROPIONATE 50 MCG/ACT NA SUSP   Nasal   Place 2 sprays into the nose daily.   16 g   2   . LISINOPRIL-HYDROCHLOROTHIAZIDE 20-12.5 MG PO TABS   Oral   Take  1 tablet by mouth 2 (two) times daily.   60 tablet   6   . NYSTATIN 100000 UNIT/GM EX POWD      Apply to affected area 3 times daily   60 g   2   . OMEPRAZOLE 20 MG PO CPDR   Oral   Take 2 capsules (40 mg total) by mouth daily.   180 capsule   3   . OSELTAMIVIR PHOSPHATE 75 MG PO CAPS   Oral   Take 1 capsule (75 mg total) by mouth daily.   10 capsule   0   . PROMETHAZINE HCL 25 MG PO TABS   Oral   Take 25 mg by mouth every 6 (six) hours as needed.         Marland Kitchen PROPRANOLOL HCL ER 160 MG PO CP24   Oral   Take 1 capsule (160 mg total) by mouth daily.   90 capsule   1   . RIZATRIPTAN BENZOATE 10 MG PO TABS   Oral   Take 1 tablet (10 mg total) by mouth as needed. May repeat in 2 hours if needed   12 tablet   11   . ZOLPIDEM TARTRATE 10 MG PO TABS   Oral   Take 10 mg by mouth at bedtime as needed.             BP 144/86  Pulse 87  Temp 98.4 F (36.9 C) (Oral)  Resp 18  Ht 5\' 2"  (1.575 m)  Wt 218 lb (98.884 kg)  BMI 39.87 kg/m2  SpO2 99%  Physical Exam  Constitutional: She appears well-developed and  well-nourished.  HENT:  Head: Normocephalic and atraumatic.  Right Ear: External ear normal.  Left Ear: External ear normal.       +nasal erythema, rhinorrhea bilaterally, + post oropharyngeal erythema    Eyes: Conjunctivae normal are normal. Pupils are equal, round, and reactive to light.  Neck: Normal range of motion. Neck supple.       Mild cervical LAD   Cardiovascular: Normal rate, regular rhythm and normal heart sounds.   Pulmonary/Chest: Effort normal and breath sounds normal. She has no wheezes.  Abdominal: Soft.  Musculoskeletal: Normal range of motion.  Neurological: She is alert.  Skin: Skin is warm.    ED Course  Procedures (including critical care time)  Labs Reviewed - No data to display No results found.   1. Sinusitis   2. URI (upper respiratory infection)       MDM  Will cover with zpak  Discussed supportive care and infectious/resp red flags.  Follow up with PCP if not improved in 5-7 days.     The patient and/or caregiver has been counseled thoroughly with regard to treatment plan and/or medications prescribed including dosage, schedule, interactions, rationale for use, and possible side effects and they verbalize understanding. Diagnoses and expected course of recovery discussed and will return if not improved as expected or if the condition worsens. Patient and/or caregiver verbalized understanding.             Doree Albee, MD 02/26/12 1945

## 2012-04-10 ENCOUNTER — Other Ambulatory Visit: Payer: Self-pay | Admitting: Family Medicine

## 2012-09-06 ENCOUNTER — Encounter: Payer: Self-pay | Admitting: Family Medicine

## 2012-09-06 ENCOUNTER — Ambulatory Visit (INDEPENDENT_AMBULATORY_CARE_PROVIDER_SITE_OTHER): Payer: Private Health Insurance - Indemnity | Admitting: Family Medicine

## 2012-09-06 VITALS — BP 155/90 | HR 74 | Temp 97.8°F | Wt 222.0 lb

## 2012-09-06 DIAGNOSIS — N390 Urinary tract infection, site not specified: Secondary | ICD-10-CM

## 2012-09-06 DIAGNOSIS — R609 Edema, unspecified: Secondary | ICD-10-CM

## 2012-09-06 DIAGNOSIS — R300 Dysuria: Secondary | ICD-10-CM

## 2012-09-06 LAB — POCT URINALYSIS DIPSTICK
Ketones, UA: NEGATIVE
Protein, UA: NEGATIVE
Urobilinogen, UA: 0.2
pH, UA: 6

## 2012-09-06 MED ORDER — CEPHALEXIN 500 MG PO CAPS: 500.0000 mg | ORAL_CAPSULE | Freq: Three times a day (TID) | ORAL | Status: AC

## 2012-09-06 NOTE — Progress Notes (Signed)
CC: Kristen Spence is a 52 y.o. female is here for Dysuria and Leg Swelling   Subjective: HPI:  Patient is a dysuria and urgency of moderate severity that came on abruptly over the weekend, approximately 2 days ago. Slowly worsen a daily basis. No interventions as of yet. Present all hours of the day does not interfere with sleep. Dysuria is described only as a burning. She denies back pain, flank pain, abdominal pain, pelvic pain nor genitourinary complaints otherwise she has a history of nephritis requiring nephrostomy tubes In the remote past  Patient complaint of bilateral leg swelling has been present for one week. She reports this is common for her in the past she has required as needed Lasix. She's unsure about salt intake it sounds like there is a high potential for her to be more salt than she knows through process foods. Symptoms are somewhat absent in the morning worse as the day progresses described as mild in severity. They're symmetric and she denies history of blood clots. She denies swelling or swollen the body. Denies bloating, chest pain, shortness of breath, orthopnea, irregular heartbeat   Review Of Systems Outlined In HPI  Past Medical History  Diagnosis Date  . Heart palpitations   . Hiatal hernia   . Edema   . Insomnia   . Dizziness   . Migraine   . Hypertension   . Fatigue   . Benign positional vertigo   . GERD (gastroesophageal reflux disease)     hiatal hernia     Family History  Problem Relation Age of Onset  . Alcohol abuse Father   . Stroke Father   . Hypertension Father   . Ovarian cancer Mother   . Depression Mother   . Diabetes Mother   . Hyperlipidemia Mother   . Hypertension Mother   . COPD Mother   . Heart attack Sister 67  . Breast cancer Sister      History  Substance Use Topics  . Smoking status: Never Smoker   . Smokeless tobacco: Never Used  . Alcohol Use: No     Objective: Filed Vitals:   09/06/12 1603  BP: 155/90   Pulse: 74  Temp: 97.8 F (36.6 C)    General: Alert and Oriented, No Acute Distress HEENT: Pupils equal, round, reactive to light. Conjunctivae clear.  Moist mucous membranes pharynx unremarkable Lungs: Clear to auscultation bilaterally, no wheezing/ronchi/rales.  Comfortable work of breathing. Good air movement. Cardiac: Regular rate and rhythm. Normal S1/S2.  No murmurs, rubs, nor gallops.   Back: No CVA tenderness Extremities: Mild peripheral edema of the ankles bilaterally and symmetric  Mental Status: No depression, anxiety, nor agitation. Skin: Warm and dry.  Assessment & Plan: Ocean was seen today for dysuria and leg swelling.  Diagnoses and associated orders for this visit:  Dysuria - Urinalysis Dipstick - Urine Culture  UTI (urinary tract infection) - cephALEXin (KEFLEX) 500 MG capsule; Take 1 capsule (500 mg total) by mouth 3 (three) times daily.  Edema - BASIC METABOLIC PANEL WITH GFR    Urinalysis suspicious for urinary tract infection we'll treat with Keflex based on sensitivities from most recent UTI cough not improved on Thursday we'll follow culture Edema: Will rule out renal insufficiency before starting as needed Lasix  Return in about 4 weeks (around 10/04/2012) for PCP FU.

## 2012-09-07 ENCOUNTER — Telehealth: Payer: Self-pay | Admitting: Family Medicine

## 2012-09-07 DIAGNOSIS — R609 Edema, unspecified: Secondary | ICD-10-CM

## 2012-09-07 LAB — BASIC METABOLIC PANEL WITH GFR
BUN: 8 mg/dL (ref 6–23)
CO2: 26 mEq/L (ref 19–32)
Calcium: 9.4 mg/dL (ref 8.4–10.5)
Creat: 0.74 mg/dL (ref 0.50–1.10)
GFR, Est African American: >89 mL/min
Glucose, Bld: 117 mg/dL — ABNORMAL HIGH (ref 70–99)
Sodium: 140 mEq/L (ref 135–145)

## 2012-09-07 MED ORDER — FUROSEMIDE 20 MG PO TABS: 20.0000 mg | ORAL_TABLET | Freq: Every day | ORAL | Status: DC | PRN

## 2012-09-07 NOTE — Telephone Encounter (Signed)
Sue Lush, Will you please let Mrs. Noren know that her kidney function appears normal and should tolerate as needed furosemide/lasix that i sent to her [facility] on [address].  However, this can cause her potassium to drop so now add a banana or glass of OJ to her daily diet.  If she begins the lasix/furosemide f/u with her PCP Dr. Judie Petit in 1-2 weeks.

## 2012-09-07 NOTE — Telephone Encounter (Signed)
Left detailed message on vm.

## 2012-09-08 ENCOUNTER — Ambulatory Visit: Payer: Private Health Insurance - Indemnity | Admitting: Family Medicine

## 2012-09-08 LAB — URINE CULTURE

## 2012-09-30 ENCOUNTER — Ambulatory Visit: Payer: Private Health Insurance - Indemnity | Admitting: Family Medicine

## 2012-10-06 ENCOUNTER — Ambulatory Visit (INDEPENDENT_AMBULATORY_CARE_PROVIDER_SITE_OTHER): Payer: Private Health Insurance - Indemnity | Admitting: Family Medicine

## 2012-10-06 ENCOUNTER — Encounter: Payer: Self-pay | Admitting: Family Medicine

## 2012-10-06 VITALS — BP 124/88 | HR 80 | Wt 202.0 lb

## 2012-10-06 DIAGNOSIS — K219 Gastro-esophageal reflux disease without esophagitis: Secondary | ICD-10-CM

## 2012-10-06 DIAGNOSIS — R196 Halitosis: Secondary | ICD-10-CM

## 2012-10-06 DIAGNOSIS — R319 Hematuria, unspecified: Secondary | ICD-10-CM

## 2012-10-06 DIAGNOSIS — N390 Urinary tract infection, site not specified: Secondary | ICD-10-CM

## 2012-10-06 DIAGNOSIS — R300 Dysuria: Secondary | ICD-10-CM

## 2012-10-06 DIAGNOSIS — Z1231 Encounter for screening mammogram for malignant neoplasm of breast: Secondary | ICD-10-CM

## 2012-10-06 DIAGNOSIS — R6889 Other general symptoms and signs: Secondary | ICD-10-CM

## 2012-10-06 DIAGNOSIS — I10 Essential (primary) hypertension: Secondary | ICD-10-CM

## 2012-10-06 LAB — POCT URINALYSIS DIPSTICK
Ketones, UA: NEGATIVE
Protein, UA: NEGATIVE
Spec Grav, UA: >=1.03
Urobilinogen, UA: 0.2
pH, UA: 6

## 2012-10-06 MED ORDER — SULFAMETHOXAZOLE-TMP DS 800-160 MG PO TABS: 1.0000 | ORAL_TABLET | Freq: Two times a day (BID) | ORAL | Status: DC

## 2012-10-06 MED ORDER — OMEPRAZOLE 20 MG PO CPDR: 40.0000 mg | DELAYED_RELEASE_CAPSULE | Freq: Every day | ORAL | Status: DC

## 2012-10-06 MED ORDER — FLUCONAZOLE 150 MG PO TABS: 150.0000 mg | ORAL_TABLET | Freq: Once | ORAL | Status: DC

## 2012-10-06 MED ORDER — DULOXETINE HCL 60 MG PO CPEP: ORAL_CAPSULE | ORAL | Status: DC

## 2012-10-06 MED ORDER — LISINOPRIL-HYDROCHLOROTHIAZIDE 20-25 MG PO TABS: 1.0000 | ORAL_TABLET | Freq: Every day | ORAL | Status: DC

## 2012-10-06 MED ORDER — RIZATRIPTAN BENZOATE 10 MG PO TABS: 10.0000 mg | ORAL_TABLET | ORAL | Status: DC | PRN

## 2012-10-06 NOTE — Progress Notes (Signed)
  Subjective:    Patient ID: Kristen Spence, female    DOB: 08/11/1960, 52 y.o.   MRN: [id number]  HPI HTN  - Says went to ED and they stopped her propranol for positional dizziness.  They started her on lisinopril/hctz.  Pt denies chest pain, SOB, dizziness, or heart palpitations.  Taking meds as directed w/o problems.  Denies medication side effects.  Says BP at home on the weekends is great. Only use lasix if swollen.  Says her work is really stressing her out. In fact she is trying to find a new job.    Seen for UTI about 4 weeks. Was on keflex and felt better but when completed the antibiotic she started noticing odor as well.  She denies any dysuria.  Has had some post nasal drip and odor in her mouth.  No back pain or fever. Urine culture from August grew out Escherichia coli and it was sensitive to the Keflex.  She also complains of a foul odor to her breath at the last couple of days. She denies any nasal congestion, sinus pain or pressure, ear pain or pressure or significant postnasal drip. She's not sure what may have caused it. She says she's been gargling and trying to eat lots of breath mints.  Review of Systems     Objective:   Physical Exam  Constitutional: She is oriented to person, place, and time. She appears well-developed and well-nourished.  HENT:  Head: Normocephalic and atraumatic.  Right Ear: External ear normal.  Left Ear: External ear normal.  Nose: Nose normal.  Mouth/Throat: Oropharynx is clear and moist.  TMs and canals are clear.   Eyes: Conjunctivae and EOM are normal. Pupils are equal, round, and reactive to light.  Neck: Neck supple. No thyromegaly present.  Cardiovascular: Normal rate, regular rhythm and normal heart sounds.   Pulmonary/Chest: Effort normal and breath sounds normal. She has no wheezes.  Lymphadenopathy:    She has no cervical adenopathy.  Neurological: She is alert and oriented to person, place, and time.  Skin: Skin is warm and dry.   Psychiatric: She has a normal mood and affect.          Assessment & Plan:  HTN-  repeat blood pressure looks great. We'll continue his current regimen. Followup in 6 months. Call for any problems or concerns.  UTI - will tx with bactrim x 10 days. F/U one week after complete the antibiotic and recheck urine to confirm that we have cleared the infection. That way if she gets another one we noted to separate infection. And if she doesn't clear it with a culture that seems to be sensitive to we may need to consider referring her to urology for further evaluation.  Declined flu vaccine today.  Bad breath-nothing on exam that could be causing the bad breath. We will see if treating her with Bactrim for her UTI makes a difference. It's possible she could have an underlying sinus infection that is causing a postnasal drip with bad odor.  Refer for screening mammogram.

## 2012-10-09 ENCOUNTER — Other Ambulatory Visit: Payer: Self-pay | Admitting: Family Medicine

## 2012-10-09 LAB — URINE CULTURE

## 2012-10-09 MED ORDER — NITROFURANTOIN MONOHYD MACRO 100 MG PO CAPS: 100.0000 mg | ORAL_CAPSULE | Freq: Two times a day (BID) | ORAL | Status: AC

## 2012-12-09 DIAGNOSIS — F4322 Adjustment disorder with anxiety: Secondary | ICD-10-CM | POA: Insufficient documentation

## 2012-12-09 DIAGNOSIS — F329 Major depressive disorder, single episode, unspecified: Secondary | ICD-10-CM | POA: Insufficient documentation

## 2012-12-13 DIAGNOSIS — F411 Generalized anxiety disorder: Secondary | ICD-10-CM | POA: Insufficient documentation

## 2012-12-14 ENCOUNTER — Encounter: Payer: Self-pay | Admitting: Family Medicine

## 2012-12-14 ENCOUNTER — Ambulatory Visit (INDEPENDENT_AMBULATORY_CARE_PROVIDER_SITE_OTHER): Payer: Private Health Insurance - Indemnity | Admitting: Family Medicine

## 2012-12-14 VITALS — BP 177/97 | HR 79 | Temp 98.1°F | Wt 222.0 lb

## 2012-12-14 DIAGNOSIS — R05 Cough: Secondary | ICD-10-CM

## 2012-12-14 DIAGNOSIS — A499 Bacterial infection, unspecified: Secondary | ICD-10-CM

## 2012-12-14 DIAGNOSIS — B9689 Other specified bacterial agents as the cause of diseases classified elsewhere: Secondary | ICD-10-CM

## 2012-12-14 DIAGNOSIS — J329 Chronic sinusitis, unspecified: Secondary | ICD-10-CM

## 2012-12-14 DIAGNOSIS — R059 Cough, unspecified: Secondary | ICD-10-CM

## 2012-12-14 MED ORDER — AMOXICILLIN-POT CLAVULANATE 500-125 MG PO TABS: ORAL_TABLET | ORAL | Status: AC

## 2012-12-14 MED ORDER — HYDROCODONE-HOMATROPINE 5-1.5 MG/5ML PO SYRP: 5.0000 mL | ORAL_SOLUTION | Freq: Four times a day (QID) | ORAL | Status: DC | PRN

## 2012-12-14 NOTE — Progress Notes (Signed)
CC: Kristen Spence is a 52 y.o. female is here for Sinusitis   Subjective: HPI:  Complains of facial pain localized below both eyes radiating into her upper molars that has been present for one week worsening on a daily basis now moderate in severity. Accompanied by subjective fevers and night sweats. Since onset has been accompanied by subjective postnasal drip and nonproductive cough. Also did seem to be worse in the evenings when lying down flat. Cough is interfering with sleep severely over the past 2 days. Denies headache, motor sensory disturbances, rashes, confusion, shortness of breath nor blood in sputum   Review Of Systems Outlined In HPI  Past Medical History  Diagnosis Date  . Heart palpitations   . Hiatal hernia   . Edema   . Insomnia   . Dizziness   . Migraine   . Hypertension   . Fatigue   . Benign positional vertigo   . GERD (gastroesophageal reflux disease)     hiatal hernia     Family History  Problem Relation Age of Onset  . Alcohol abuse Father   . Stroke Father   . Hypertension Father   . Ovarian cancer Mother   . Depression Mother   . Diabetes Mother   . Hyperlipidemia Mother   . Hypertension Mother   . COPD Mother   . Heart attack Sister 67  . Breast cancer Sister      History  Substance Use Topics  . Smoking status: Never Smoker   . Smokeless tobacco: Never Used  . Alcohol Use: No     Objective: Filed Vitals:   12/14/12 1532  BP: 177/97  Pulse: 79  Temp: 98.1 F (36.7 C)    General: Alert and Oriented, No Acute Distress HEENT: Pupils equal, round, reactive to light. Conjunctivae clear.  External ears unremarkable, canals clear with intact TMs with appropriate landmarks.  Left middle ear unremarkable right middle ear with mild serous effusion. Pink inferior turbinates.  Moist mucous membranes, pharynx without inflammation nor lesions.  Neck supple without palpable lymphadenopathy nor abnormal masses. Maxillary sinus tenderness  bilaterally Lungs: Clear to auscultation bilaterally, no wheezing/ronchi/rales.  Comfortable work of breathing. Good air movement. Frequent coughing Cardiac: Regular rate and rhythm. Normal S1/S2.  No murmurs, rubs, nor gallops.   Mental Status: No depression, anxiety, nor agitation. Skin: Warm and dry.  Assessment & Plan: Kristen Spence was seen today for sinusitis.  Diagnoses and associated orders for this visit:  Bacterial sinusitis - amoxicillin-clavulanate (AUGMENTIN) 500-125 MG per tablet; Take one by mouth every 8 hours for ten total days.  Cough - HYDROcodone-homatropine (HYCODAN) 5-1.5 MG/5ML syrup; Take 5 mLs by mouth every 6 (six) hours as needed for cough.    Bacterial sinusitis: Start Augmentin avoid decongestants given elevated blood pressure, consider Hycodan to help with sleep only, return sometime next week for repeat blood pressure check  Return if symptoms worsen or fail to improve.

## 2012-12-23 DIAGNOSIS — F4001 Agoraphobia with panic disorder: Secondary | ICD-10-CM | POA: Insufficient documentation

## 2012-12-30 ENCOUNTER — Telehealth: Payer: Self-pay | Admitting: Family Medicine

## 2012-12-30 NOTE — Telephone Encounter (Signed)
Pt came by. Miracle wash is not working she was told by pharmacy that she need a nastatin>

## 2013-01-02 MED ORDER — NYSTATIN 100000 UNIT/ML MT SUSP: 5.0000 mL | Freq: Four times a day (QID) | OROMUCOSAL | Status: DC

## 2013-01-02 NOTE — Telephone Encounter (Signed)
Will call in nystatin. Call if not better in one week. Pt notified.

## 2013-02-23 ENCOUNTER — Other Ambulatory Visit: Payer: Self-pay | Admitting: *Deleted

## 2013-02-23 MED ORDER — OSELTAMIVIR PHOSPHATE 75 MG PO CAPS: 75.0000 mg | ORAL_CAPSULE | Freq: Every day | ORAL | Status: DC

## 2013-02-23 NOTE — Telephone Encounter (Signed)
Ok to send per Dr. Benjamin Stainhekkekandam.  Meyer CoryMisty Hayes Rehfeldt, LPN

## 2013-04-04 ENCOUNTER — Ambulatory Visit (INDEPENDENT_AMBULATORY_CARE_PROVIDER_SITE_OTHER): Payer: Private Health Insurance - Indemnity | Admitting: Family Medicine

## 2013-04-04 ENCOUNTER — Encounter: Payer: Self-pay | Admitting: Family Medicine

## 2013-04-04 VITALS — BP 144/85 | HR 76 | Temp 97.8°F | Wt 216.0 lb

## 2013-04-04 DIAGNOSIS — R05 Cough: Secondary | ICD-10-CM

## 2013-04-04 DIAGNOSIS — J019 Acute sinusitis, unspecified: Secondary | ICD-10-CM

## 2013-04-04 DIAGNOSIS — R059 Cough, unspecified: Secondary | ICD-10-CM

## 2013-04-04 MED ORDER — NYSTATIN 100000 UNIT/ML MT SUSP
5.0000 mL | Freq: Four times a day (QID) | OROMUCOSAL | Status: DC
Start: 2013-04-04 — End: 2013-05-09

## 2013-04-04 MED ORDER — AMOXICILLIN-POT CLAVULANATE 875-125 MG PO TABS: 1.0000 | ORAL_TABLET | Freq: Two times a day (BID) | ORAL | Status: DC

## 2013-04-04 MED ORDER — HYDROCODONE-HOMATROPINE 5-1.5 MG/5ML PO SYRP: 5.0000 mL | ORAL_SOLUTION | Freq: Four times a day (QID) | ORAL | Status: DC | PRN

## 2013-04-04 NOTE — Progress Notes (Signed)
   Subjective:    Patient ID: Kristen CastleBrenda K Sawtelle, female    DOB: 1961/01/17, 53 y.o.   MRN: [id number]  HPI 6 days of sinus congestion. Nasal drainage is brown.   Saw blood this AM.  + cough as well.+ post nasal drip. Having fever and chills.  Some ear pressure and itching.  Has been sneezing.  Using allegra. + runny nose. + facial pain and HA.     Review of Systems     Objective:   Physical Exam  Constitutional: She is oriented to person, place, and time. She appears well-developed and well-nourished.  HENT:  Head: Normocephalic and atraumatic.  Right Ear: External ear normal.  Left Ear: External ear normal.  Nose: Nose normal.  TMs and canals are clear. OP with mild erythema.   Eyes: Conjunctivae and EOM are normal. Pupils are equal, round, and reactive to light.  Neck: Neck supple. No thyromegaly present.  Cardiovascular: Normal rate, regular rhythm and normal heart sounds.   Pulmonary/Chest: Effort normal and breath sounds normal. She has no wheezes.  Lymphadenopathy:    She has no cervical adenopathy.  Neurological: She is alert and oriented to person, place, and time.  Skin: Skin is warm and dry. No pallor.  Psychiatric: She has a normal mood and affect. Her behavior is normal.          Assessment & Plan:  Acute sinusitis - Will tx with augment. Can continue symptomic care. She feels she is starting to get thrush again as well. So will call in nystatin.  Given cough medicine as well.

## 2013-04-24 ENCOUNTER — Other Ambulatory Visit: Payer: Self-pay | Admitting: Family Medicine

## 2013-05-09 ENCOUNTER — Ambulatory Visit (INDEPENDENT_AMBULATORY_CARE_PROVIDER_SITE_OTHER): Payer: Private Health Insurance - Indemnity | Admitting: Family Medicine

## 2013-05-09 ENCOUNTER — Encounter: Payer: Self-pay | Admitting: Family Medicine

## 2013-05-09 VITALS — BP 127/8 | HR 82 | Wt 217.0 lb

## 2013-05-09 DIAGNOSIS — R5383 Other fatigue: Secondary | ICD-10-CM

## 2013-05-09 DIAGNOSIS — R6884 Jaw pain: Secondary | ICD-10-CM

## 2013-05-09 DIAGNOSIS — E559 Vitamin D deficiency, unspecified: Secondary | ICD-10-CM

## 2013-05-09 DIAGNOSIS — R5381 Other malaise: Secondary | ICD-10-CM

## 2013-05-09 NOTE — Progress Notes (Signed)
Subjective:    Patient ID: Kristen Spence, female    DOB: 09-08-1960, 53 y.o.   MRN: [id number]  HPI Right jaw pain last night fo 2 hours. Sharp. Just happened at rest. She wasn't talking or chewy.  Then radiated into her neck. No CP.  Now feels numb. No visual changes.  + fam hx of MI premature. No ear pain or pressure of hearign loss. No recent URI or ST. No GERD or reflux.  Feels really fatigued for the last week.. Says her body and her legs feel heavy. Muscles in face feel sore today. She denies any recent changes in her medications or over-the-counter products.  Review of Systems  BP 127/8  Pulse 82  Wt 217 lb (98.431 kg)  SpO2 95%    Allergies  Allergen Reactions  . Banana   . Clonazepam Other (See Comments)    Makes her feel weird.     Past Medical History  Diagnosis Date  . Heart palpitations   . Hiatal hernia   . Edema   . Insomnia   . Dizziness   . Migraine   . Hypertension   . Fatigue   . Benign positional vertigo   . GERD (gastroesophageal reflux disease)     hiatal hernia    Past Surgical History  Procedure Laterality Date  . Cholecystectomy  1987  . Cesarean section  1982  . Breast lumpectomy  2000   . Endometrial ablation  09/30/2006  . Total vaginal hysterectomy  09/27/2008    Dr. Lavon PaganiniStacey Reynolds  . Esophagogastroduodenoscopy      Showed grade D. reflux esophagitis    History   Social History  . Marital Status: Married    Spouse Name: bobby     Number of Children: 2  . Years of Education: 10th grade   Occupational History  . 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 Smoking status: Never Smoker   . Smokeless tobacco: Never Used  . Alcohol Use: No  . Drug Use: No  . Sexual Activity: Not on file   Other Topics Concern  . Not on file   Social History Narrative   No regular exercise    Family History  Problem Relation Age of Onset  . Alcohol abuse Father   . Stroke Father   . Hypertension Father    . Ovarian cancer Mother   . Depression Mother   . Diabetes Mother   . Hyperlipidemia Mother   . Hypertension Mother   . COPD Mother   . Heart attack Sister [id number]  . Breast cancer Sister     Outpatient Encounter Prescriptions as of 05/09/2013  Medication Sig  . ALPRAZolam (XANAX) 0.5 MG tablet Take 1 tablet (0.5 mg total) by mouth 2 (two) times daily as needed.  . DULoxetine (CYMBALTA) 60 MG capsule TAKE 1 CAPSULE (60 MG TOTAL) BY MOUTH 2 (TWO) TIMES DAILY.  . furosemide (LASIX) 20 MG tablet Take 1 tablet (20 mg total) by mouth daily as needed for edema.  Marland Kitchen. lisinopril-hydrochlorothiazide (PRINZIDE,ZESTORETIC) 20-25 MG per tablet TAKE 1 TABLET BY MOUTH DAILY.  Marland Kitchen. omeprazole (PRILOSEC) 20 MG capsule Take 2 capsules (40 mg total) by mouth daily.  . promethazine (PHENERGAN) 25 MG tablet Take 25 mg by mouth every 6 (six) hours as needed.  . rizatriptan (MAXALT) 10 MG tablet Take 1 tablet (10 mg total) by mouth as needed. May repeat in 2 hours if needed  . zolpidem (AMBIEN)  10 MG tablet Take 10 mg by mouth at bedtime as needed.    . Beclomethasone Dipropionate (QNASL) 80 MCG/ACT AERS Place 2 sprays into the nose daily. 2 sprays each nostirl  . propranolol ER (INDERAL LA) 160 MG SR capsule Take 1 capsule (160 mg total) by mouth daily.  . [DISCONTINUED] amoxicillin-clavulanate (AUGMENTIN) 875-125 MG per tablet Take 1 tablet by mouth 2 (two) times daily.  . [DISCONTINUED] HYDROcodone-homatropine (HYCODAN) 5-1.5 MG/5ML syrup Take 5 mLs by mouth every 6 (six) hours as needed for cough.  . [DISCONTINUED] nystatin (MYCOSTATIN) 100000 UNIT/ML suspension Take 5 mLs (500,000 Units total) by mouth 4 (four) times daily.          Objective:   Physical Exam  Constitutional: She is oriented to person, place, and time. She appears well-developed and well-nourished.  HENT:  Head: Normocephalic and atraumatic.  Right Ear: External ear normal.  Left Ear: External ear normal.  Nose: Nose normal.   Mouth/Throat: Oropharynx is clear and moist.  TMs and canals are clear. No pain or tenderness over the TMJ. Able to open and close without difficulty or popping. She is nontender along the jaw line. Dentition appears to be okay.  Eyes: Conjunctivae and EOM are normal. Pupils are equal, round, and reactive to light.  Neck: Neck supple. No thyromegaly present.  Cardiovascular: Normal rate, regular rhythm and normal heart sounds.   Pulmonary/Chest: Effort normal and breath sounds normal. She has no wheezes.  Lymphadenopathy:    She has no cervical adenopathy.  Neurological: She is alert and oriented to person, place, and time.  Skin: Skin is warm and dry.  Psychiatric: She has a normal mood and affect.          Assessment & Plan:  Right jaw pain - she is very worried about heart disease in her family. Check elecytrolytes, CK, and check heart enzymes, and thyroid. EKG shows rate of 77 beats per minute, normal sinus rhythm. Inverted T wave in lead 3. No significant ST-T wave changes in the lateral leads. She does have poor R wave progression in the lateral leads.  No change from prior EKG done in 2011. She had inverted T waves and poor R wave progression at that time. And some evidence of left ventricular hypertrophy.  Muscle fatigue-we'll also check a CK, thyroid and CBC.

## 2013-05-10 ENCOUNTER — Other Ambulatory Visit: Payer: Self-pay | Admitting: *Deleted

## 2013-05-10 ENCOUNTER — Encounter: Payer: Self-pay | Admitting: *Deleted

## 2013-05-10 DIAGNOSIS — E559 Vitamin D deficiency, unspecified: Secondary | ICD-10-CM

## 2013-05-10 LAB — COMPLETE METABOLIC PANEL WITH GFR
ALT: 25 U/L (ref 0–35)
AST: 21 U/L (ref 0–37)
Albumin: 4.3 g/dL (ref 3.5–5.2)
Alkaline Phosphatase: 85 U/L (ref 39–117)
BUN: 10 mg/dL (ref 6–23)
CO2: 26 meq/L (ref 19–32)
CREATININE: 0.96 mg/dL (ref 0.50–1.10)
Calcium: 9.6 mg/dL (ref 8.4–10.5)
Chloride: 99 mEq/L (ref 96–112)
GFR, EST AFRICAN AMERICAN: 79 mL/min
GFR, Est Non African American: 69 mL/min
Glucose, Bld: 96 mg/dL (ref 70–99)
POTASSIUM: 3.8 meq/L (ref 3.5–5.3)
Sodium: 137 mEq/L (ref 135–145)
Total Bilirubin: 0.5 mg/dL (ref 0.2–1.2)
Total Protein: 7.5 g/dL (ref 6.0–8.3)

## 2013-05-10 LAB — CBC WITH DIFFERENTIAL/PLATELET
Basophils Absolute: 0 10*3/uL (ref 0.0–0.1)
Basophils Relative: 0 % (ref 0–1)
EOS PCT: 1 % (ref 0–5)
Eosinophils Absolute: 0.1 10*3/uL (ref 0.0–0.7)
HEMATOCRIT: 41.2 % (ref 36.0–46.0)
Hemoglobin: 14.5 g/dL (ref 12.0–15.0)
LYMPHS PCT: 21 % (ref 12–46)
Lymphs Abs: 2.4 10*3/uL (ref 0.7–4.0)
MCH: 29.9 pg (ref 26.0–34.0)
MCHC: 35.2 g/dL (ref 30.0–36.0)
MCV: 84.9 fL (ref 78.0–100.0)
MONOS PCT: 5 % (ref 3–12)
Monocytes Absolute: 0.6 10*3/uL (ref 0.1–1.0)
NEUTROS ABS: 8.3 10*3/uL — AB (ref 1.7–7.7)
Neutrophils Relative %: 73 % (ref 43–77)
Platelets: 398 10*3/uL (ref 150–400)
RBC: 4.85 MIL/uL (ref 3.87–5.11)
RDW: 13.9 % (ref 11.5–15.5)
WBC: 11.4 10*3/uL — AB (ref 4.0–10.5)

## 2013-05-10 LAB — CK TOTAL AND CKMB (NOT AT ARMC)
CK TOTAL: 41 U/L (ref 7–177)
CK, MB: <0.7 ng/mL (ref 0.0–5.0)

## 2013-05-10 LAB — TROPONIN I

## 2013-05-10 LAB — TSH: TSH: 2.442 u[IU]/mL (ref 0.350–4.500)

## 2013-05-10 LAB — VITAMIN D 25 HYDROXY (VIT D DEFICIENCY, FRACTURES): Vit D, 25-Hydroxy: 17 ng/mL — ABNORMAL LOW (ref 30–89)

## 2013-10-26 ENCOUNTER — Other Ambulatory Visit: Payer: Self-pay | Admitting: Family Medicine

## 2013-11-03 ENCOUNTER — Telehealth: Payer: Self-pay | Admitting: *Deleted

## 2013-11-03 NOTE — Telephone Encounter (Signed)
[place] aware that omeprazoleSteward Drone prior Berkley Harveyauth was approved. Corliss SkainsJamie Rama Sorci, CMA

## 2013-12-11 ENCOUNTER — Encounter: Payer: Private Health Insurance - Indemnity | Admitting: Obstetrics & Gynecology

## 2013-12-13 ENCOUNTER — Other Ambulatory Visit: Payer: Self-pay | Admitting: *Deleted

## 2013-12-13 ENCOUNTER — Other Ambulatory Visit: Payer: Self-pay | Admitting: Family Medicine

## 2013-12-13 MED ORDER — FLUCONAZOLE 150 MG PO TABS: 150.0000 mg | ORAL_TABLET | Freq: Once | ORAL | Status: AC

## 2013-12-22 ENCOUNTER — Encounter: Payer: Private Health Insurance - Indemnity | Admitting: Family Medicine

## 2014-01-08 ENCOUNTER — Encounter: Payer: Self-pay | Admitting: Family Medicine

## 2014-01-08 ENCOUNTER — Ambulatory Visit (INDEPENDENT_AMBULATORY_CARE_PROVIDER_SITE_OTHER): Payer: Private Health Insurance - Indemnity | Admitting: Family Medicine

## 2014-01-08 VITALS — BP 146/96 | HR 71 | Temp 96.9°F | Ht 62.0 in | Wt 217.0 lb

## 2014-01-08 DIAGNOSIS — Z01419 Encounter for gynecological examination (general) (routine) without abnormal findings: Secondary | ICD-10-CM

## 2014-01-08 DIAGNOSIS — Z1231 Encounter for screening mammogram for malignant neoplasm of breast: Secondary | ICD-10-CM

## 2014-01-08 DIAGNOSIS — IMO0001 Reserved for inherently not codable concepts without codable children: Secondary | ICD-10-CM

## 2014-01-08 DIAGNOSIS — Z0000 Encounter for general adult medical examination without abnormal findings: Secondary | ICD-10-CM

## 2014-01-08 DIAGNOSIS — R635 Abnormal weight gain: Secondary | ICD-10-CM

## 2014-01-08 LAB — COMPLETE METABOLIC PANEL WITH GFR
ALK PHOS: 95 U/L (ref 39–117)
ALT: 23 U/L (ref 0–35)
AST: 17 U/L (ref 0–37)
Albumin: 4.2 g/dL (ref 3.5–5.2)
BILIRUBIN TOTAL: 0.6 mg/dL (ref 0.2–1.2)
BUN: 8 mg/dL (ref 6–23)
CO2: 26 meq/L (ref 19–32)
Calcium: 9.9 mg/dL (ref 8.4–10.5)
Chloride: 106 mEq/L (ref 96–112)
Creat: 0.67 mg/dL (ref 0.50–1.10)
GLUCOSE: 91 mg/dL (ref 70–99)
Potassium: 4.3 mEq/L (ref 3.5–5.3)
Sodium: 142 mEq/L (ref 135–145)
Total Protein: 7.2 g/dL (ref 6.0–8.3)

## 2014-01-08 LAB — LIPID PANEL
Cholesterol: 185 mg/dL (ref 0–200)
HDL: 45 mg/dL (ref >39)
LDL Cholesterol: 124 mg/dL — ABNORMAL HIGH (ref 0–99)
TRIGLYCERIDES: 78 mg/dL (ref <150)
Total CHOL/HDL Ratio: 4.1 Ratio
VLDL: 16 mg/dL (ref 0–40)

## 2014-01-08 MED ORDER — NYSTATIN 100000 UNIT/GM EX POWD: CUTANEOUS | Status: DC

## 2014-01-08 MED ORDER — FLUCONAZOLE 150 MG PO TABS: 150.0000 mg | ORAL_TABLET | Freq: Once | ORAL | Status: DC

## 2014-01-08 MED ORDER — LIRAGLUTIDE -WEIGHT MANAGEMENT 18 MG/3ML ~~LOC~~ SOPN: 0.6000 mg | PEN_INJECTOR | Freq: Every day | SUBCUTANEOUS | Status: DC

## 2014-01-08 NOTE — Progress Notes (Signed)
Subjective:    Patient ID: Kristen Spence, female    DOB: Aug 01, 1960, 53 y.o.   MRN: [id number]  HPI    Review of Systems     Objective:   Physical Exam        Assessment & Plan:    Subjective:     Kristen Spence is a 53 y.o. female and is here for a comprehensive physical exam. The patient reports no problems. She's very concerned about her weight. She says she's tried to cut back on calories but says she gets short of breath when she exercises and so hasn't really push this very much. But she notes her weight is continuing to go up and it's affecting her joint pain and her breathing. She is at the point where she really wants to try medication to assist her. We discussed this previously but also want her to get her blood pressure down and under better control.  History   Social History  . Marital Status: Married    Spouse Name: bobby     Number of Children: 2  . Years of Education: 10th grade   Occupational History  . 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 Smoking status: Never Smoker   . Smokeless tobacco: Never Used  . Alcohol Use: No  . Drug Use: No  . Sexual Activity: Not on file   Other Topics Concern  . Not on file   Social History Narrative   No regular exercise   Health Maintenance  Topic Date Due  . MAMMOGRAM  10/20/2011  . INFLUENZA VACCINE  08/19/2013  . COLONOSCOPY  02/20/2015  . TETANUS/TDAP  01/20/2016    The following portions of the patient's history were reviewed and updated as appropriate: allergies, current medications, past family history, past medical history, past social history, past surgical history and problem list.  Review of Systems A comprehensive review of systems was negative.   Objective:    There were no vitals taken for this visit. General appearance: alert and cooperative Head: Normocephalic, without obvious abnormality, atraumatic Eyes: conj claer, EOMi, PEERLA Ears: normal  TM's and external ear canals both ears Nose: Nares normal. Septum midline. Mucosa normal. No drainage or sinus tenderness. Throat: lips, mucosa, and tongue normal; teeth and gums normal Neck: no adenopathy, no carotid bruit, no JVD, supple, symmetrical, trachea midline and thyroid not enlarged, symmetric, no tenderness/mass/nodules Back: symmetric, no curvature. ROM normal. No CVA tenderness. Lungs: clear to auscultation bilaterally Breasts: normal appearance, no masses or tenderness Heart: regular rate and rhythm, S1, S2 normal, no murmur, click, rub or gallop Abdomen: soft, non-tender; bowel sounds normal; no masses,  no organomegaly Extremities: extremities normal, atraumatic, no cyanosis or edema Pulses: 2+ and symmetric Skin: Skin color, texture, turgor normal. No rashes or lesions Lymph nodes: Cervical, supraclavicular, and axillary nodes normal. Neurologic: Alert and oriented X 3, normal strength and tone. Normal symmetric reflexes. Normal coordination and gait    Assessment:    Healthy female exam.      Plan:     See After Visit Summary for Counseling Recommendations  Keep up a regular exercise program and make sure you are eating a healthy diet Try to eat 4 servings of dairy a day, or if you are lactose intolerant take a calcium with vitamin D daily.  Your vaccines are up to date.  Due for mammogram.   Discussed shingles vaccine. Encouraged her to check with her insurance coverage.  Abnormal weight loss - we discussed options. Because of her elevated blood pressure levels on very leery of putting her on a stimulant medication. It looks like her formulary will cover Sexenda, Qsymia, and phentermine. We will start with Sexenda. Discussed how the medication works and potential side effects. We'll send her perception to her pharmacy. Unfortunately we did not have any additional coupon cards to give her today. I would like to see her back in 6 weeks.

## 2014-01-08 NOTE — Patient Instructions (Signed)
Keep up a regular exercise program and make sure you are eating a healthy diet Try to eat 4 servings of dairy a day, or if you are lactose intolerant take a calcium with vitamin D daily.  Your vaccines are up to date.   

## 2014-01-09 ENCOUNTER — Other Ambulatory Visit: Payer: Self-pay | Admitting: *Deleted

## 2014-01-09 ENCOUNTER — Telehealth: Payer: Self-pay

## 2014-01-09 MED ORDER — LISINOPRIL-HYDROCHLOROTHIAZIDE 20-25 MG PO TABS: 1.0000 | ORAL_TABLET | Freq: Every day | ORAL | Status: DC

## 2014-01-09 MED ORDER — FLUTICASONE PROPIONATE 50 MCG/ACT NA SUSP: 2.0000 | Freq: Every day | NASAL | Status: DC

## 2014-01-09 NOTE — Telephone Encounter (Signed)
Patient is waiting for PA on Saxenda. If medication does not get approved she would like Dr Linford ArnoldMetheney to recommend something a lot cheaper. The cost is 1,000 a month.

## 2014-01-16 NOTE — Telephone Encounter (Signed)
Prior authorization for saxenda  inititated via cover my meds. Awaiting decision.

## 2014-02-19 ENCOUNTER — Ambulatory Visit: Payer: Private Health Insurance - Indemnity | Admitting: Family Medicine

## 2014-02-19 DIAGNOSIS — Z0289 Encounter for other administrative examinations: Secondary | ICD-10-CM

## 2014-02-23 ENCOUNTER — Telehealth: Payer: Self-pay

## 2014-02-23 NOTE — Telephone Encounter (Signed)
Resubmitted through cover my meds. Waiting on PA - CF

## 2014-02-27 ENCOUNTER — Telehealth: Payer: Self-pay

## 2014-02-27 NOTE — Telephone Encounter (Signed)
Received denial from [facility] that they will not cover Saxenda--3mg /0.5ML.  Denial states that the decision relates specifically to coverage provided under her prescription benefit plan.  The plan does not cover the requested medication.  The patient may choose to receive the medication at her own expense.  I spoke with patient and the medication is $1,100.00.  She can't afford that and wants Dr. Linford ArnoldMetheney to recommend another medication.

## 2014-02-28 MED ORDER — PHENTERMINE-TOPIRAMATE ER 3.75-23 MG PO CP24: 1.0000 | ORAL_CAPSULE | Freq: Every day | ORAL | Status: DC

## 2014-02-28 NOTE — Telephone Encounter (Signed)
We could consider Qsymia instead.Will send a new rx. See if we have coupon cards.

## 2014-03-12 ENCOUNTER — Telehealth: Payer: Self-pay | Admitting: *Deleted

## 2014-03-12 ENCOUNTER — Ambulatory Visit (INDEPENDENT_AMBULATORY_CARE_PROVIDER_SITE_OTHER): Payer: BLUE CROSS/BLUE SHIELD | Admitting: Family Medicine

## 2014-03-12 ENCOUNTER — Ambulatory Visit (INDEPENDENT_AMBULATORY_CARE_PROVIDER_SITE_OTHER): Payer: BLUE CROSS/BLUE SHIELD

## 2014-03-12 ENCOUNTER — Other Ambulatory Visit: Payer: Self-pay | Admitting: Family Medicine

## 2014-03-12 ENCOUNTER — Encounter: Payer: Self-pay | Admitting: Family Medicine

## 2014-03-12 ENCOUNTER — Telehealth: Payer: Self-pay

## 2014-03-12 VITALS — BP 158/83 | HR 85 | Temp 97.5°F | Wt 217.0 lb

## 2014-03-12 DIAGNOSIS — R109 Unspecified abdominal pain: Secondary | ICD-10-CM | POA: Diagnosis not present

## 2014-03-12 DIAGNOSIS — R319 Hematuria, unspecified: Secondary | ICD-10-CM

## 2014-03-12 DIAGNOSIS — N200 Calculus of kidney: Secondary | ICD-10-CM | POA: Diagnosis not present

## 2014-03-12 LAB — POCT URINALYSIS DIPSTICK
Bilirubin, UA: NEGATIVE
Glucose, UA: NEGATIVE
Ketones, UA: NEGATIVE
NITRITE UA: POSITIVE
PROTEIN UA: NEGATIVE
SPEC GRAV UA: 1.025
UROBILINOGEN UA: 0.2
pH, UA: 6

## 2014-03-12 MED ORDER — PROMETHAZINE HCL 25 MG PO TABS: 25.0000 mg | ORAL_TABLET | Freq: Four times a day (QID) | ORAL | Status: DC | PRN

## 2014-03-12 MED ORDER — NITROFURANTOIN MONOHYD MACRO 100 MG PO CAPS: 100.0000 mg | ORAL_CAPSULE | Freq: Two times a day (BID) | ORAL | Status: AC

## 2014-03-12 MED ORDER — KETOROLAC TROMETHAMINE 60 MG/2ML IM SOLN
60.0000 mg | Freq: Once | INTRAMUSCULAR | Status: AC
  Administered 2014-03-12: 60 mg via INTRAMUSCULAR

## 2014-03-12 MED ORDER — HYDROCODONE-ACETAMINOPHEN 10-325 MG PO TABS: 0.5000 | ORAL_TABLET | Freq: Three times a day (TID) | ORAL | Status: DC | PRN

## 2014-03-12 NOTE — Telephone Encounter (Signed)
No PA required for CT abdomen and pelvis without contrast, per Mema

## 2014-03-12 NOTE — Progress Notes (Signed)
CC: Kristen Spence is a 54 y.o. female is here for Flank Pain   Subjective: HPI:   Right flank pain that came on suddenly on Saturday afternoon that is localized in the lower aspect of the flank and radiates into the right groin. Symptoms have been worsening since onset. Nothing particularly makes the symptoms better or worse. She cannot find a position that helps or worsens the pain. No benefit from Tylenol or ibuprofen. Accompanied by nausea late last night and a feeling of just overall sickness today. She denies any abdominal pain, blood in urine, urinary frequency urgency or hesitancy. Denies fevers, chills, cough or shortness of breath. The pain is worse when she takes a deep breath though. Symptoms are currently moderate in severity but will spike to a severe severity without warning for a few seconds periodically throughout the day.  Review Of Systems Outlined In HPI  Past Medical History  Diagnosis Date  . Heart palpitations   . Hiatal hernia   . Edema   . Insomnia   . Dizziness   . Migraine   . Hypertension   . Fatigue   . Benign positional vertigo   . GERD (gastroesophageal reflux disease)     hiatal hernia    Past Surgical History  Procedure Laterality Date  . Cholecystectomy  1987  . Cesarean section  1982  . Breast lumpectomy  2000   . Endometrial ablation  09/30/2006  . Total vaginal hysterectomy  09/27/2008    Dr. Lavon PaganiniStacey Reynolds  . Esophagogastroduodenoscopy      Showed grade D. reflux esophagitis   Family History  Problem Relation Age of Onset  . Alcohol abuse Father   . Stroke Father   . Hypertension Father   . Ovarian cancer Mother   . Depression Mother   . Diabetes Mother   . Hyperlipidemia Mother   . Hypertension Mother   . COPD Mother   . Heart attack Sister [id number]  . Breast cancer Sister     History   Social History  . Marital Status: Married    Spouse Name: bobby   . Number of Children: 2  . Years of Education: 10th grade   Occupational  History  .   [employer]   Social History Main Topics  . Smoking status: Never Smoker   . Smokeless tobacco: Never Used  . Alcohol Use: No  . Drug Use: No  . Sexual Activity: Not on file   Other Topics Concern  . Not on file   Social History Narrative   No regular exercise     Objective: BP 158/83 mmHg  Pulse 85  Temp(Src) 97.5 F (36.4 C) (Oral)  Wt 217 lb (98.431 kg)  General: Alert and Oriented, No Acute Distress HEENT: Pupils equal, round, reactive to light. Conjunctivae clear.  Moist mucous membranes Lungs: Clear to auscultation bilaterally, no wheezing/ronchi/rales.  Comfortable work of breathing. Good air movement. Cardiac: Regular rate and rhythm. Normal S1/S2.  No murmurs, rubs, nor gallops.   Abdomen: Normal bowel sounds, soft and non tender without palpable masses. Back: No CVA tenderness no reproducible pain with palpation of the paraspinal musculature in the lumbar region Extremities: No peripheral edema.  Strong peripheral pulses.  Mental Status: No depression, anxiety, nor agitation. Skin: Warm and dry.no overlying skin changes the site of discomfort  Assessment & Plan: Steward DroneBrenda was seen today for flank pain.  Diagnoses and all orders for this visit:  Right flank pain Orders: -  nitrofurantoin, macrocrystal-monohydrate, (MACROBID) 100 MG capsule; Take 1 capsule (100 mg total) by mouth 2 (two) times daily. -     HYDROcodone-acetaminophen (NORCO) 10-325 MG per tablet; Take 0.5-1 tablets by mouth every 8 (eight) hours as needed for moderate pain. -     Cancel: CT Abdomen Pelvis Wo Contrast; Future -     Urine culture -     ketorolac (TORADOL) injection 60 mg; Inject 2 mLs (60 mg total) into the muscle once.  Hematuria Orders: -     Urine culture   Urinalysis suggestive of UTI with blood and positive nitrite.  CT scan of the abdomen and pelvis was ordered to evaluate for nephrolithiasis and possible hydronephrosis.fortunately no obstructive stone or  any other abnormality was found to account for her pain. We'll treat UTI with nitrofurantoin based on most recent urine culture. Obtain new urine culture today.   Return if symptoms worsen or fail to improve.

## 2014-03-12 NOTE — Telephone Encounter (Signed)
I spoke with Steward DroneBrenda she is going to come in and pick up coupon for Qsymia. - CF

## 2014-03-12 NOTE — Patient Instructions (Signed)
Fill your prescription for hydrocodone and Macrobid, begin these immediately and await for phone call regarding the results of your CT scan and whether or not you need an additional medication for a kidney stone.

## 2014-03-12 NOTE — Telephone Encounter (Signed)
Pt would like a work note for today

## 2014-03-12 NOTE — Addendum Note (Signed)
Addended by: Wyline BeadyMCCRIMMON, Raffaella Edison C on: 03/12/2014 04:54 PM   Modules accepted: Orders

## 2014-03-13 ENCOUNTER — Telehealth: Payer: Self-pay | Admitting: Family Medicine

## 2014-03-13 MED ORDER — DULOXETINE HCL 60 MG PO CPEP: ORAL_CAPSULE | ORAL | Status: DC

## 2014-03-13 MED ORDER — PROPRANOLOL HCL ER 160 MG PO CP24: 160.0000 mg | ORAL_CAPSULE | Freq: Every day | ORAL | Status: DC

## 2014-03-13 MED ORDER — RIZATRIPTAN BENZOATE 10 MG PO TABS: 10.0000 mg | ORAL_TABLET | ORAL | Status: DC | PRN

## 2014-03-13 NOTE — Addendum Note (Signed)
Addended by: Nani GasserMETHENEY, Najee Cowens D on: 03/13/2014 08:57 PM   Modules accepted: Orders

## 2014-03-13 NOTE — Telephone Encounter (Signed)
Patient needs a refill on Maxalt 10 mg [facility] to [facility] as soon as possible.  She has a migraine from being nauseous yesterday.   She said that the pharmacy has sent request for refills on all her meds.  She said that she thought they were all sent back in Jan when she had her CPE

## 2014-03-13 NOTE — Telephone Encounter (Signed)
rx sent. Some were sent in December but not all of them.

## 2014-03-15 LAB — URINE CULTURE

## 2014-03-23 ENCOUNTER — Encounter: Payer: Self-pay | Admitting: Family Medicine

## 2014-03-23 ENCOUNTER — Ambulatory Visit (INDEPENDENT_AMBULATORY_CARE_PROVIDER_SITE_OTHER): Payer: BLUE CROSS/BLUE SHIELD | Admitting: Family Medicine

## 2014-03-23 VITALS — BP 142/88 | HR 70 | Wt 218.0 lb

## 2014-03-23 DIAGNOSIS — N3001 Acute cystitis with hematuria: Secondary | ICD-10-CM

## 2014-03-23 DIAGNOSIS — B3749 Other urogenital candidiasis: Secondary | ICD-10-CM | POA: Diagnosis not present

## 2014-03-23 DIAGNOSIS — R609 Edema, unspecified: Secondary | ICD-10-CM

## 2014-03-23 LAB — POCT URINALYSIS DIPSTICK
Bilirubin, UA: NEGATIVE
Glucose, UA: NEGATIVE
Leukocytes, UA: NEGATIVE
NITRITE UA: NEGATIVE
PROTEIN UA: NEGATIVE
SPEC GRAV UA: 1.025
UROBILINOGEN UA: 0.2
pH, UA: 6

## 2014-03-23 MED ORDER — AMOXICILLIN-POT CLAVULANATE 875-125 MG PO TABS: 1.0000 | ORAL_TABLET | Freq: Two times a day (BID) | ORAL | Status: DC

## 2014-03-23 MED ORDER — FUROSEMIDE 20 MG PO TABS: 20.0000 mg | ORAL_TABLET | Freq: Every day | ORAL | Status: DC | PRN

## 2014-03-23 MED ORDER — ALPRAZOLAM 0.5 MG PO TABS: 0.5000 mg | ORAL_TABLET | Freq: Two times a day (BID) | ORAL | Status: DC | PRN

## 2014-03-23 NOTE — Addendum Note (Signed)
Addended by: Luella CookMCINTYRE, Kaelah Hayashi E on: 03/23/2014 03:42 PM   Modules accepted: Orders

## 2014-03-23 NOTE — Progress Notes (Signed)
   Subjective:    Patient ID: Kristen CastleBrenda K Buehler, female    DOB: Apr 22, 1960, 54 y.o.   MRN: [id number]  HPI Here for follow-up UTI-unfortunately she's really not feeling a whole lot better. She completed the nitrofurantoin for an Escherichia coli urinary tract infection. The culture was sensitive to this. She did feel better while on the antibiotic but as soon as she completed it started to feel bad again. She just has a lot of pressure in her pelvis and in her low back. No hematuria fevers chills or sweats.   Review of Systems     Objective:   Physical Exam  Constitutional: She is oriented to person, place, and time. She appears well-developed and well-nourished.  HENT:  Head: Normocephalic and atraumatic.  Cardiovascular: Normal rate, regular rhythm and normal heart sounds.   Pulmonary/Chest: Effort normal and breath sounds normal.  Abdominal: Soft. Bowel sounds are normal. She exhibits no distension and no mass. There is tenderness. There is no rebound and no guarding.  +suprapubic tenderness.   Musculoskeletal:  No CVA tenderness.   Neurological: She is alert and oriented to person, place, and time.  Skin: Skin is warm and dry.  Psychiatric: She has a normal mood and affect. Her behavior is normal.          Assessment & Plan:  UTI-urinalysis today is positive for blood. We'll send for culture again. We'll go ahead and place her Augmentin, based on the original culture results. We'll call her once results are available. When she clears the infection we will need to have a repeat a urine just make sure that the hematuria has cleared as well. She does have a prior history of kidney stones but does not feel like this is consistent with stones.

## 2014-03-28 ENCOUNTER — Other Ambulatory Visit: Payer: Self-pay | Admitting: *Deleted

## 2014-03-28 MED ORDER — VALACYCLOVIR HCL 1 G PO TABS
1000.0000 mg | ORAL_TABLET | Freq: Two times a day (BID) | ORAL | Status: DC
Start: 2014-03-28 — End: 2015-06-04

## 2014-03-28 NOTE — Telephone Encounter (Signed)
Informed pt that because there wasn't a barcode on her Ucx it wasn't resulted. She was advised that if she is not feeling any better after finishing the abx she will need it come back in and submit another sample. Pt voiced understanding.Loralee PacasBarkley, Auset Fritzler MentorLynetta\

## 2014-04-10 ENCOUNTER — Telehealth: Payer: Self-pay | Admitting: Family Medicine

## 2014-04-10 NOTE — Telephone Encounter (Signed)
Resent PA for Saxenda through cover my meds waiting on auth - CF

## 2014-04-11 NOTE — Telephone Encounter (Signed)
Received fax with correct for for PA on saxenda filled out and had Dr. Linford Arnoldmetheney sign. Faxed over waiting on auth. - CF

## 2014-04-16 NOTE — Telephone Encounter (Signed)
Received Fax authorizing Saxenda from 04/11/2014 - 08/11/2014 - CF

## 2014-08-12 ENCOUNTER — Other Ambulatory Visit: Payer: Self-pay | Admitting: Family Medicine

## 2014-10-18 ENCOUNTER — Telehealth: Payer: Self-pay

## 2014-10-18 NOTE — Telephone Encounter (Signed)
Left message asking if she is taking her blood pressure medication daily. We received a letter from [facility] about patient's refill history. If patient calls back I will find out what we could do to help patient refill her blood pressure medication on time, maybe a 90 day supply.

## 2014-10-25 ENCOUNTER — Encounter: Payer: Self-pay | Admitting: Osteopathic Medicine

## 2014-10-25 ENCOUNTER — Ambulatory Visit (INDEPENDENT_AMBULATORY_CARE_PROVIDER_SITE_OTHER): Payer: BLUE CROSS/BLUE SHIELD | Admitting: Osteopathic Medicine

## 2014-10-25 VITALS — BP 136/88 | HR 78 | Temp 97.7°F | Wt 208.0 lb

## 2014-10-25 DIAGNOSIS — R8290 Unspecified abnormal findings in urine: Secondary | ICD-10-CM

## 2014-10-25 DIAGNOSIS — R002 Palpitations: Secondary | ICD-10-CM

## 2014-10-25 DIAGNOSIS — R42 Dizziness and giddiness: Secondary | ICD-10-CM

## 2014-10-25 MED ORDER — MECLIZINE HCL 25 MG PO TABS: 25.0000 mg | ORAL_TABLET | Freq: Three times a day (TID) | ORAL | Status: DC | PRN

## 2014-10-25 NOTE — Progress Notes (Signed)
HPI: Kristen Spence is a 54 y.o. female who presents to [facility] Kristen Spence  today for chief complaint of:  Chief Complaint  Patient presents with  . Dizziness   . Quality: feeling heart palpitations and dizziness . Severity: moderate . Duration: 1 month . Timing: intermittent, more frequently lately almost daily, lasted probably on and off episodes lasting about few minutes for heart racing, then dizziness happens. Resolves on its own.  . Modifying factors: nothing makes better or worse . Assoc signs/symptoms: no chest pain, feels some tightness, no coughing or SOB, (+) stress/anxiety.   Marland Kitchen HTN - taking BP at home as high as systolic 180s but no record of it here. No chest pain on exertion, no SOB on exertion. Propranolol stopped by ER last year.    Past medical, social and family history reviewed: Past Medical History  Diagnosis Date  . Heart palpitations   . Hiatal hernia   . Edema   . Insomnia   . Dizziness   . Migraine   . Hypertension   . Fatigue   . Benign positional vertigo   . GERD (gastroesophageal reflux disease)     hiatal hernia   Past Surgical History  Procedure Laterality Date  . Cholecystectomy  1987  . Cesarean section  1982  . Breast lumpectomy  2000   . Endometrial ablation  09/30/2006  . Total vaginal hysterectomy  09/27/2008    Dr. Lavon Paganini  . Esophagogastroduodenoscopy      Showed grade D. reflux esophagitis   Social History  Substance Use Topics  . Smoking status: Never Smoker   . Smokeless tobacco: Never Used  . Alcohol Use: No   Family History  Problem Relation Age of Onset  . Alcohol abuse Father   . Stroke Father   . Hypertension Father   . Ovarian cancer Mother   . Depression Mother   . Diabetes Mother   . Hyperlipidemia Mother   . Hypertension Mother   . COPD Mother   . Heart attack Sister 99  . Breast cancer Sister     Current Outpatient Prescriptions  Medication Sig Dispense Refill  .  ALPRAZolam (XANAX) 0.5 MG tablet Take 1 tablet (0.5 mg total) by mouth 2 (two) times daily as needed. 45 tablet 1  . DULoxetine (CYMBALTA) 60 MG capsule TAKE 1 CAPSULE (60 MG TOTAL) BY MOUTH 2 (TWO) TIMES DAILY. 180 capsule 1  . fluticasone (FLONASE) 50 MCG/ACT nasal spray Place 2 sprays into both nostrils daily. 16 g 6  . furosemide (LASIX) 20 MG tablet Take 1 tablet (20 mg total) by mouth daily as needed for edema. 30 tablet 1  . lisinopril-hydrochlorothiazide (PRINZIDE,ZESTORETIC) 20-25 MG per tablet Take 1 tablet by mouth daily. 90 tablet 1  . omeprazole (PRILOSEC) 20 MG capsule TAKE 2 CAPSULES (40 MG TOTAL) BY MOUTH DAILY. 180 capsule 0  . Phentermine-Topiramate (QSYMIA) 3.75-23 MG CP24 Take 1 tablet by mouth daily. 14 capsule 0  . promethazine (PHENERGAN) 25 MG tablet Take 1 tablet (25 mg total) by mouth every 6 (six) hours as needed. 15 tablet 0  . rizatriptan (MAXALT) 10 MG tablet Take 1 tablet (10 mg total) by mouth as needed. May repeat in 2 hours if needed 12 tablet 11  . valACYclovir (VALTREX) 1000 MG tablet Take 1 tablet (1,000 mg total) by mouth 2 (two) times daily. 30 tablet 1  . Beclomethasone Dipropionate (QNASL) 80 MCG/ACT AERS Place 2 sprays into the nose daily. 2 sprays  each nostirl 1 Inhaler 0  . meclizine (ANTIVERT) 25 MG tablet Take 1 tablet (25 mg total) by mouth 3 (three) times daily as needed for dizziness or nausea. 30 tablet 3   No current facility-administered medications for this visit.   Allergies  Allergen Reactions  . Banana   . Clonazepam Other (See Comments)    Makes her feel weird.       Review of Systems: CONSTITUTIONAL: Neg fever/chills, no unintentional weight changes HEAD/EYES/EARS/NOSE/THROAT: No headache/vision change or hearing change, no sore throat CARDIAC: No chest pain/pressure/palpitations, no orthopnea RESPIRATORY: No cough/shortness of breath/wheeze GASTROINTESTINAL: No nausea/vomiting/abdominal pain/blood in  stool/diarrhea/constipation MUSCULOSKELETAL: No myalgia/arthralgia GENITOURINARY: No incontinence, No abnormal genital bleeding/discharge SKIN: No rash/wounds/concerning lesions HEM/ONC: No easy bruising/bleeding, no abnormal lymph node ENDOCRINE: No polyuria/polydipsia/polyphagia, no heat/cold intolerance  NEUROLOGIC: No weakness/dizzines/slurred speech PSYCHIATRIC: No concerns with depression/anxiety or sleep problems    Exam:  BP 136/88 mmHg  Pulse 78  Temp(Src) 97.7 F (36.5 C) (Oral)  Wt 208 lb (94.348 kg)  SpO2 95% Constitutional: VSS, see above. General Appearance: alert, well-developed, well-nourished, NAD Eyes: Normal lids and conjunctive, non-icteric sclera, PERRLA Neck: No masses, trachea midline. No thyroid enlargement/tenderness/mass appreciated Respiratory: Normal respiratory effort. no wheeze/rhonchi/rales Cardiovascular: S1/S2 normal, no murmur/rub/gallop auscultated. RRR. No lower extremity edema. Gastrointestinal: Nontender, no masses. No hepatomegaly, no splenomegaly. No hernia appreciated. Bowel sounds normal. Rectal exam deferred.  Musculoskeletal: Gait normal. No clubbing/cyanosis of digits.  Neurological: No cranial nerve deficit on limited exam. Motor and sensation intact and symmetric Psychiatric: Normal judgment/insight. Normal mood and affect. Oriented x3.    No results found for this or any previous visit (from the past 72 hour(s)).  EKG interpretation: Rate: 70 Rhythm: sinus No ST/T changes concerning for acute ischemia/infarct  LVH  Normal axis QT normal   ASSESSMENT/PLAN:  Dizziness - Plan: EKG 12-Lead, Orthostatic vital signs, meclizine (ANTIVERT) 25 MG tablet, CBC, COMPLETE METABOLIC PANEL WITH GFR, Magnesium, TSH, Urinalysis  Palpitations - Plan: EKG 12-Lead, Orthostatic vital signs, CBC, COMPLETE METABOLIC PANEL WITH GFR, Magnesium, TSH, Urinalysis   Consider echocardiogram versus neuro referral for tilt table testing. Given that  palpitations seem to cause the dizziness, more concern for cardiac problems so would likely get Echo or refer to cardio first for cardiac monitoring/Holter. ER precautions reviewed re: chest pain, syncope, SOB, other concerns. Pt asked to bring BP cuff to visit to calibrate with our equipment here. Reporting elevated BP up to systolic 188. Info printed re: taking BP at home. F/u Dr Linford Arnold 1 week.

## 2014-10-25 NOTE — Patient Instructions (Signed)
How to Take Your Blood Pressure °HOW DO I GET A BLOOD PRESSURE MACHINE? °· You can buy an electronic home blood pressure machine at your local pharmacy. Insurance will sometimes cover the cost if you have a prescription. °· Ask your doctor what type of machine is best for you. There are different machines for your arm and your wrist. °· If you decide to buy a machine to check your blood pressure on your arm, first check the size of your arm so you can buy the right size cuff. To check the size of your arm:   °¨ Use a measuring tape that shows both inches and centimeters.   °¨ Wrap the measuring tape around the upper-middle part of your arm. You may need someone to help you measure.   °¨ Write down your arm measurement in both inches and centimeters.   °· To measure your blood pressure correctly, it is important to have the right size cuff.   °¨ If your arm is up to 13 inches (up to 34 centimeters), get an adult cuff size. °¨ If your arm is 13 to 17 inches (35 to 44 centimeters), get a large adult cuff size.   °¨  If your arm is 17 to 20 inches (45 to 52 centimeters), get an adult thigh cuff.   °WHAT DO THE NUMBERS MEAN?  °· There are two numbers that make up your blood pressure. For example: 120/80. °¨ The first number (120 in our example) is called the "systolic pressure." It is a measure of the pressure in your blood vessels when your heart is pumping blood. °¨ The second number (80 in our example) is called the "diastolic pressure." It is a measure of the pressure in your blood vessels when your heart is resting between beats. °· Your doctor will tell you what your blood pressure should be. °WHAT SHOULD I DO BEFORE I CHECK MY BLOOD PRESSURE?  °· Try to rest or relax for at least 30 minutes before you check your blood pressure. °· Do not smoke. °· Do not have any drinks with caffeine, such as: °¨ Soda. °¨ Coffee. °¨ Tea. °· Check your blood pressure in a quiet room. °· Sit down and stretch out your arm on a table.  Keep your arm at about the level of your heart. Let your arm relax. °· Make sure that your legs are not crossed. °HOW DO I CHECK MY BLOOD PRESSURE? °· Follow the directions that came with your machine. °· Make sure you remove any tight-fitting clothing from your arm or wrist. Wrap the cuff around your upper arm or wrist. You should be able to fit a finger between the cuff and your arm. If you cannot fit a finger between the cuff and your arm, it is too tight and should be removed and rewrapped. °· Some units require you to manually pump up the arm cuff. °· Automatic units inflate the cuff when you press a button. °· Cuff deflation is automatic in both models. °· After the cuff is inflated, the unit measures your blood pressure and pulse. The readings are shown on a monitor. Hold still and breathe normally while the cuff is inflated. °· Getting a reading takes less than a minute. °· Some models store readings in a memory. Some provide a printout of readings. If your machine does not store your readings, keep a written record. °· Take readings with you to your next visit with your doctor. °  °This information is not intended to replace advice given to   you by your health care provider. Make sure you discuss any questions you have with your health care provider. °  °Document Released: 12/19/2007 Document Revised: 01/26/2014 Document Reviewed: 03/02/2013 °Elsevier Interactive Patient Education ©2016 [employer] ° °

## 2014-10-26 LAB — URINALYSIS
Bilirubin Urine: NEGATIVE
Glucose, UA: NEGATIVE
Ketones, ur: NEGATIVE
LEUKOCYTES UA: NEGATIVE
NITRITE: POSITIVE — AB
PH: 6 (ref 5.0–8.0)
Protein, ur: NEGATIVE
SPECIFIC GRAVITY, URINE: 1.015 (ref 1.001–1.035)

## 2014-10-26 LAB — URINALYSIS, MICROSCOPIC ONLY
CASTS: NONE SEEN [LPF]
Yeast: NONE SEEN [HPF]

## 2014-10-26 LAB — COMPLETE METABOLIC PANEL WITH GFR
ALT: 15 U/L (ref 6–29)
AST: 15 U/L (ref 10–35)
Albumin: 4 g/dL (ref 3.6–5.1)
Alkaline Phosphatase: 74 U/L (ref 33–130)
BUN: 9 mg/dL (ref 7–25)
CHLORIDE: 106 mmol/L (ref 98–110)
CO2: 28 mmol/L (ref 20–31)
Calcium: 9.5 mg/dL (ref 8.6–10.4)
Creat: 0.66 mg/dL (ref 0.50–1.05)
GFR, Est African American: >89 mL/min (ref >=60)
GLUCOSE: 81 mg/dL (ref 65–99)
POTASSIUM: 4.1 mmol/L (ref 3.5–5.3)
SODIUM: 139 mmol/L (ref 135–146)
Total Bilirubin: 0.5 mg/dL (ref 0.2–1.2)
Total Protein: 6.9 g/dL (ref 6.1–8.1)

## 2014-10-26 LAB — CBC
HCT: 40.3 % (ref 36.0–46.0)
Hemoglobin: 13.6 g/dL (ref 12.0–15.0)
MCH: 29.1 pg (ref 26.0–34.0)
MCHC: 33.7 g/dL (ref 30.0–36.0)
MCV: 86.1 fL (ref 78.0–100.0)
MPV: 9.6 fL (ref 8.6–12.4)
Platelets: 377 10*3/uL (ref 150–400)
RBC: 4.68 MIL/uL (ref 3.87–5.11)
RDW: 13.9 % (ref 11.5–15.5)
WBC: 9.6 10*3/uL (ref 4.0–10.5)

## 2014-10-26 LAB — TSH: TSH: 2.139 u[IU]/mL (ref 0.350–4.500)

## 2014-10-26 LAB — MAGNESIUM: Magnesium: 2.2 mg/dL (ref 1.5–2.5)

## 2014-10-26 NOTE — Addendum Note (Signed)
Addended by: Deirdre Pippins on: 10/26/2014 08:23 AM   Modules accepted: Orders

## 2014-12-22 ENCOUNTER — Other Ambulatory Visit: Payer: Self-pay | Admitting: Family Medicine

## 2015-03-04 ENCOUNTER — Encounter: Payer: Self-pay | Admitting: Physician Assistant

## 2015-03-04 ENCOUNTER — Ambulatory Visit (INDEPENDENT_AMBULATORY_CARE_PROVIDER_SITE_OTHER): Payer: BLUE CROSS/BLUE SHIELD | Admitting: Physician Assistant

## 2015-03-04 VITALS — BP 156/87 | HR 72 | Ht 62.0 in | Wt 208.0 lb

## 2015-03-04 DIAGNOSIS — R0981 Nasal congestion: Secondary | ICD-10-CM

## 2015-03-04 DIAGNOSIS — R040 Epistaxis: Secondary | ICD-10-CM

## 2015-03-04 DIAGNOSIS — H1013 Acute atopic conjunctivitis, bilateral: Secondary | ICD-10-CM

## 2015-03-04 DIAGNOSIS — H1010 Acute atopic conjunctivitis, unspecified eye: Secondary | ICD-10-CM | POA: Insufficient documentation

## 2015-03-04 DIAGNOSIS — J309 Allergic rhinitis, unspecified: Secondary | ICD-10-CM

## 2015-03-04 MED ORDER — METHYLPREDNISOLONE SODIUM SUCC 125 MG IJ SOLR
125.0000 mg | Freq: Once | INTRAMUSCULAR | Status: AC
  Administered 2015-03-04: 125 mg via INTRAMUSCULAR

## 2015-03-04 MED ORDER — PREDNISONE 50 MG PO TABS: ORAL_TABLET | ORAL | Status: DC

## 2015-03-04 NOTE — Patient Instructions (Addendum)
AYR nasal gel for nasal dryness.   Nosebleed Nosebleeds are common. They are due to a crack in the inside lining of your nose (mucous membrane) or from a small blood vessel that starts to bleed. Nosebleeds can be caused by many conditions, such as injury, infections, dry mucous membranes or dry climate, medicines, nose picking, and home heating and cooling systems. Most nosebleeds come from blood vessels in the front of your nose. HOME CARE INSTRUCTIONS   Try controlling your nosebleed by pinching your nostrils gently and continuously for at least 10 minutes.  Avoid blowing or sniffing your nose for a number of hours after having a nosebleed.  Do not put gauze inside your nose yourself. If your nose was packed by your health care provider, try to maintain the pack inside of your nose until your health care provider removes it.  If a gauze pack was used and it starts to fall out, gently replace it or cut off the end of it.  If a balloon catheter was used to pack your nose, do not cut or remove it unless your health care provider has instructed you to do that.  Avoid lying down while you are having a nosebleed. Sit up and lean forward.  Use a nasal spray decongestant to help with a nosebleed as directed by your health care provider.  Do not use petroleum jelly or mineral oil in your nose. These can drip into your lungs.  Maintain humidity in your home by using less air conditioning or by using a humidifier.  Aspirinand blood thinners make bleeding more likely. If you are prescribed these medicines and you suffer from nosebleeds, ask your health care provider if you should stop taking the medicines or adjust the dose. Do not stop medicines unless directed by your health care provider  Resume your normal activities as you are able, but avoid straining, lifting, or bending at the waist for several days.  If your nosebleed was caused by dry mucous membranes, use over-the-counter saline nasal  spray or gel. This will keep the mucous membranes moist and allow them to heal. If you must use a lubricant, choose the water-soluble variety. Use it only sparingly, and do not use it within several hours of lying down.  Keep all follow-up visits as directed by your health care provider. This is important. SEEK MEDICAL CARE IF:  You have a fever.  You get frequent nosebleeds.  You are getting nosebleeds more often. SEEK IMMEDIATE MEDICAL CARE IF:  Your nosebleed lasts longer than 20 minutes.  Your nosebleed occurs after an injury to your face, and your nose looks crooked or broken.  You have unusual bleeding from other parts of your body.  You have unusual bruising on other parts of your body.  You feel light-headed or you faint.  You become sweaty.  You vomit blood.  Your nosebleed occurs after a head injury.   This information is not intended to replace advice given to you by your health care provider. Make sure you discuss any questions you have with your health care provider.   Document Released: 10/15/2004 Document Revised: 01/26/2014 Document Reviewed: 08/21/2013 Elsevier Interactive Patient Education [employer]

## 2015-03-04 NOTE — Progress Notes (Addendum)
   Subjective:    Patient ID: Kristen Spence, female    DOB: 04-Sep-1960, 55 y.o.   MRN: [id number]  HPI  Patient is a 55 year old female presenting with epistaxis for one week. Patient had three episodes of epistaxis on 2/4, 2/6, and 2/8. Patient has a past medical history relevant for essential hypertension. Patient states that epistaxis is from both nostrils, lasts for approximately 5-10 minutes, and feels like she is swallowing blood. Patient uses pressure to relieve epistaxis. Patient denies using intranasal medications, instances of trauma, blood thinners, and a history of epistaxis. Patient has congestion, rhinorrhea, and watery eyes. Patient has been taking zyrtec.   Review of Systems   Please see HPI.  Objective:   Physical Exam  Constitutional: She is oriented to person, place, and time. She appears well-developed and well-nourished.  HENT:  Head: Normocephalic and atraumatic.  Right Ear: External ear normal.  Left Ear: External ear normal.  Mouth/Throat: Oropharynx is clear and moist.  Patient's nasal turbinates are severely swollen and boggy, the right nasal turbinates more so than the left.   Eyes: Pupils are equal, round, and reactive to light. Right eye exhibits discharge. Left eye exhibits discharge.  Conjunctiva was injected with watery discharge.   Neck: Normal range of motion. No thyromegaly present.  Cardiovascular: Normal rate, normal heart sounds and intact distal pulses.   No murmur heard. Pulmonary/Chest: Effort normal and breath sounds normal. No respiratory distress. She has no wheezes. She has no rales.  Musculoskeletal: Normal range of motion.  Neurological: She is alert and oriented to person, place, and time.  Psychiatric: She has a normal mood and affect. Her behavior is normal. Judgment and thought content normal.     Assessment & Plan:  1. Epistaxis/nasal congestion: Differential diagnosis includes nasal polyps and epistaxis from seasonal  dryness/allergies. Patient was started on prednisone 50 mg for five days. Patient was also given an injection of solumedrol  IM today. Patient was  advised to use a humidifier or steam from a shower to help with dryness. Pt was encouraged to get AYR nasal gel for moisture. Patient was advised to discontinue zyrtec. Patient is to follow up in two weeks if symptoms persist.   2. Allergic conjunctivitis-prednisone should help as well. Follow up if not improving. No physical exam signs of bacterial sinusitis seen on exam.

## 2015-03-11 ENCOUNTER — Encounter: Payer: Self-pay | Admitting: Family Medicine

## 2015-03-11 ENCOUNTER — Ambulatory Visit (INDEPENDENT_AMBULATORY_CARE_PROVIDER_SITE_OTHER): Payer: BLUE CROSS/BLUE SHIELD | Admitting: Family Medicine

## 2015-03-11 VITALS — BP 145/89 | HR 108 | Temp 98.0°F | Wt 208.0 lb

## 2015-03-11 DIAGNOSIS — H1013 Acute atopic conjunctivitis, bilateral: Secondary | ICD-10-CM

## 2015-03-11 DIAGNOSIS — J302 Other seasonal allergic rhinitis: Secondary | ICD-10-CM

## 2015-03-11 DIAGNOSIS — J309 Allergic rhinitis, unspecified: Secondary | ICD-10-CM

## 2015-03-11 MED ORDER — PREDNISONE 10 MG (48) PO TBPK: ORAL_TABLET | Freq: Every day | ORAL | Status: DC

## 2015-03-11 MED ORDER — GUAIFENESIN-CODEINE 100-10 MG/5ML PO SOLN: 5.0000 mL | Freq: Every evening | ORAL | Status: DC | PRN

## 2015-03-11 MED ORDER — MONTELUKAST SODIUM 10 MG PO TABS: 10.0000 mg | ORAL_TABLET | Freq: Every day | ORAL | Status: DC

## 2015-03-11 MED ORDER — AZITHROMYCIN 250 MG PO TABS: 250.0000 mg | ORAL_TABLET | Freq: Every day | ORAL | Status: DC

## 2015-03-11 NOTE — Patient Instructions (Addendum)
Thank you for coming in today. Take the prednisone.  Continue the allegra.  Start Singulair.  Use codeine cough medicine as needed.  Call or go to the emergency room if you get worse, have trouble breathing, have chest pains, or palpitations.   Take azithromycin antibiotic if not better.

## 2015-03-12 MED ORDER — ALBUTEROL SULFATE 108 (90 BASE) MCG/ACT IN AEPB: 1.0000 | INHALATION_SPRAY | RESPIRATORY_TRACT | Status: DC | PRN

## 2015-03-12 NOTE — Progress Notes (Signed)
Kristen Spence is a 55 y.o. female who presents to [name] today for nasal congestion discharge itchy watery eyes and sneezing. She doesn't note some coughing congestion and hoarse voice. Symptoms present for the last few days. She was recently seen by PCP on February 13 who diagnosed allergic rhinitis and prescribed prednisone among other treatments. This helped however following the prednisone course her symptoms returned. She's had a few episodes of epistaxis as well. Overall she feels well with no vomiting or diarrhea. She continues to take Allegra-D. She notes she is completely intolerant to nasal steroids.   Past Medical History  Diagnosis Date  . Heart palpitations   . Hiatal hernia   . Edema   . Insomnia   . Dizziness   . Migraine   . Hypertension   . Fatigue   . Benign positional vertigo   . GERD (gastroesophageal reflux disease)     hiatal hernia   Past Surgical History  Procedure Laterality Date  . Cholecystectomy  1987  . Cesarean section  1982  . Breast lumpectomy  2000   . Endometrial ablation  09/30/2006  . Total vaginal hysterectomy  09/27/2008    Dr. Lavon Paganini  . Esophagogastroduodenoscopy      Showed grade D. reflux esophagitis   Social History  Substance Use Topics  . Smoking status: Never Smoker   . Smokeless tobacco: Never Used  . Alcohol Use: No   family history includes Alcohol abuse in her father; Breast cancer in her sister; COPD in her mother; Depression in her mother; Diabetes in her mother; Heart attack (age of onset: 40) in her sister; Hyperlipidemia in her mother; Hypertension in her father and mother; Ovarian cancer in her mother; Stroke in her father.  ROS as above Medications: Current Outpatient Prescriptions  Medication Sig Dispense Refill  . ALPRAZolam (XANAX) 0.5 MG tablet Take 1 tablet (0.5 mg total) by mouth 2 (two)  times daily as needed. 45 tablet 1  . DULoxetine (CYMBALTA) 60 MG capsule TAKE 1 CAPSULE (60 MG TOTAL) BY MOUTH 2 (TWO) TIMES DAILY. 180 capsule 1  . furosemide (LASIX) 20 MG tablet Take 1 tablet (20 mg total) by mouth daily as needed for edema. 30 tablet 1  . lisinopril-hydrochlorothiazide (PRINZIDE,ZESTORETIC) 20-25 MG per tablet Take 1 tablet by mouth daily. 90 tablet 1  . meclizine (ANTIVERT) 25 MG tablet Take 1 tablet (25 mg total) by mouth 3 (three) times daily as needed for dizziness or nausea. 30 tablet 3  . omeprazole (PRILOSEC) 20 MG capsule TAKE 2 CAPSULES (40 MG TOTAL) BY MOUTH DAILY. 180 capsule 0  . promethazine (PHENERGAN) 25 MG tablet Take 1 tablet (25 mg total) by mouth every 6 (six) hours as needed. 15 tablet 0  . rizatriptan (MAXALT) 10 MG tablet Take 1 tablet (10 mg total) by mouth as needed. May repeat in 2 hours if needed 12 tablet 11  . valACYclovir (VALTREX) 1000 MG tablet Take 1 tablet (1,000 mg total) by mouth 2 (two) times daily. 30 tablet 1  . azithromycin (ZITHROMAX) 250 MG tablet Take 1 tablet (250 mg total) by mouth daily. Take first 2 tablets together, then 1 every day until finished. 6 tablet 0  . Beclomethasone Dipropionate (QNASL) 80 MCG/ACT AERS Place 2 sprays into the nose daily. 2 sprays each nostirl 1 Inhaler 0  . guaiFENesin-codeine 100-10 MG/5ML syrup Take 5 mLs by mouth at bedtime as needed for cough. 120 mL  0  . montelukast (SINGULAIR) 10 MG tablet Take 1 tablet (10 mg total) by mouth at bedtime. 30 tablet 1  . predniSONE (STERAPRED UNI-PAK 48 TAB) 10 MG (48) TBPK tablet Take by mouth daily. 12 day dosepack po 48 tablet 0   No current facility-administered medications for this visit.   Allergies  Allergen Reactions  . Banana   . Clonazepam Other (See Comments)    Makes her feel weird.      Exam:  BP 145/89 mmHg  Pulse 108  Temp(Src) 98 F (36.7 C) (Oral)  Wt 208 lb (94.348 kg)  SpO2 97% Gen: Well NAD HEENT: EOMI,  MMM clear nasal discharge  with inflamed nasal turbinates bilaterally. Posterior pharynx with cobblestoning. Normal tympanic membranes bilaterally.  Lungs: Normal work of breathing. CTABL Heart: RRR no MRG Abd: NABS, Soft. Nondistended, Nontender Exts: Brisk capillary refill, warm and well perfused.   No results found for this or any previous visit (from the past 24 hour(s)). No results found.   Please see individual assessment and plan sections.

## 2015-03-12 NOTE — Assessment & Plan Note (Signed)
I think the majority of patient's symptoms are related to seasonal allergies. She may have a brewing bacterial sinusitis as well. Plan to treat with longer prednisone dose pack continued antihistamines. Additionally we'll add montelukast. She would be a good candidate for nasal steroids but she is not tolerant to them. Follow-up with PCP.

## 2015-03-12 NOTE — Addendum Note (Signed)
Addended by: Rodolph Bong on: 03/12/2015 03:20 PM   Modules accepted: Orders

## 2015-03-13 ENCOUNTER — Other Ambulatory Visit: Payer: Self-pay | Admitting: Family Medicine

## 2015-05-20 ENCOUNTER — Ambulatory Visit: Payer: BLUE CROSS/BLUE SHIELD | Admitting: Family Medicine

## 2015-06-01 ENCOUNTER — Other Ambulatory Visit: Payer: Self-pay | Admitting: Family Medicine

## 2015-06-04 ENCOUNTER — Encounter: Payer: Self-pay | Admitting: Family Medicine

## 2015-06-04 ENCOUNTER — Ambulatory Visit (INDEPENDENT_AMBULATORY_CARE_PROVIDER_SITE_OTHER): Payer: BLUE CROSS/BLUE SHIELD | Admitting: Family Medicine

## 2015-06-04 VITALS — BP 142/80 | HR 70 | Wt 209.0 lb

## 2015-06-04 DIAGNOSIS — R300 Dysuria: Secondary | ICD-10-CM | POA: Diagnosis not present

## 2015-06-04 DIAGNOSIS — I10 Essential (primary) hypertension: Secondary | ICD-10-CM | POA: Diagnosis not present

## 2015-06-04 DIAGNOSIS — M25552 Pain in left hip: Secondary | ICD-10-CM

## 2015-06-04 DIAGNOSIS — B356 Tinea cruris: Secondary | ICD-10-CM

## 2015-06-04 DIAGNOSIS — M79671 Pain in right foot: Secondary | ICD-10-CM | POA: Diagnosis not present

## 2015-06-04 DIAGNOSIS — M79672 Pain in left foot: Secondary | ICD-10-CM

## 2015-06-04 LAB — POCT URINALYSIS DIPSTICK
Bilirubin, UA: NEGATIVE
Glucose, UA: NEGATIVE
Ketones, UA: NEGATIVE
Leukocytes, UA: NEGATIVE
Nitrite, UA: NEGATIVE
PH UA: 5.5
PROTEIN UA: NEGATIVE
UROBILINOGEN UA: 0.2

## 2015-06-04 MED ORDER — RIZATRIPTAN BENZOATE 10 MG PO TABS: 10.0000 mg | ORAL_TABLET | ORAL | Status: DC | PRN

## 2015-06-04 MED ORDER — PROMETHAZINE HCL 25 MG PO TABS: 25.0000 mg | ORAL_TABLET | Freq: Four times a day (QID) | ORAL | Status: DC | PRN

## 2015-06-04 MED ORDER — AMLODIPINE BESYLATE 5 MG PO TABS
5.0000 mg | ORAL_TABLET | Freq: Every day | ORAL | Status: DC
Start: 2015-06-04 — End: 2015-11-01

## 2015-06-04 MED ORDER — DULOXETINE HCL 60 MG PO CPEP: ORAL_CAPSULE | ORAL | Status: DC

## 2015-06-04 MED ORDER — FLUTICASONE PROPIONATE 50 MCG/ACT NA SUSP
NASAL | Status: DC
Start: 2015-06-04 — End: 2016-10-06

## 2015-06-04 MED ORDER — CIPROFLOXACIN HCL 500 MG PO TABS: 500.0000 mg | ORAL_TABLET | Freq: Two times a day (BID) | ORAL | Status: DC

## 2015-06-04 MED ORDER — LISINOPRIL-HYDROCHLOROTHIAZIDE 20-25 MG PO TABS: 1.0000 | ORAL_TABLET | Freq: Every day | ORAL | Status: DC

## 2015-06-04 MED ORDER — VALACYCLOVIR HCL 1 G PO TABS: 1000.0000 mg | ORAL_TABLET | Freq: Two times a day (BID) | ORAL | Status: DC

## 2015-06-04 MED ORDER — OMEPRAZOLE 20 MG PO CPDR: DELAYED_RELEASE_CAPSULE | ORAL | Status: DC

## 2015-06-04 MED ORDER — NYSTATIN-TRIAMCINOLONE 100000-0.1 UNIT/GM-% EX OINT: 1.0000 "application " | TOPICAL_OINTMENT | Freq: Two times a day (BID) | CUTANEOUS | Status: DC

## 2015-06-04 NOTE — Patient Instructions (Addendum)
Take Aleve twice a day with food and water for the next 7-10 days. Start amlodipine once a day to help reduce her blood pressure. This is in addition to the lisinopril you're already taking.

## 2015-06-04 NOTE — Addendum Note (Signed)
Addended by: Nani GasserMETHENEY, Madlynn Lundeen D on: 06/04/2015 09:05 AM   Modules accepted: Orders, SmartSet

## 2015-06-04 NOTE — Progress Notes (Addendum)
Subjective:    Patient ID: Kristen Spence, female    DOB: 15-Aug-1960, 55 y.o.   MRN: [id number]  HPI Left hip and bilat leg pain x 1 mo.  = aching.  Using heat, ice and advil to alleviated the pain.Helps some. Rates her pain 8/10.  She says it's really worse after she's been sitting for a while and then tries to stand up. She is unable to sleep on that side. She also complains that her entire legs and feet hurt. I asked her specifically if it was just the heel and arch area but she says the whole foot. There again it doesn't bother her so much while she is up and walking but more so that she's been sitting for a while and then stands up or at the end of the day.  Having dysuria, cloudy urine and odor x 1 week. No fever, chills, etc. no back pain.  Hypertension- Pt denies chest pain, SOB, dizziness, or heart palpitations.  Taking meds as directed w/o problems.  Denies medication side effects.  She says when she went to [employer] over the weekend they had a health fair. She had her blood pressure checked there and it was 170/100. They rechecked a couple more times and it finally came down to 150 systolic. She denies any excess salt intake.  She is getting a rash in her groin again. Would like a refill on her "cream". Prefers this over the powder.     Review of Systems BP 143/85 mmHg  Pulse 70  Wt 209 lb (94.802 kg)  SpO2 98%    Allergies  Allergen Reactions  . Banana   . Clonazepam Other (See Comments)    Makes her feel weird.     Past Medical History  Diagnosis Date  . Heart palpitations   . Hiatal hernia   . Edema   . Insomnia   . Dizziness   . Migraine   . Hypertension   . Fatigue   . Benign positional vertigo   . GERD (gastroesophageal reflux disease)     hiatal hernia    Past Surgical History  Procedure Laterality Date  . Cholecystectomy  1987  . Cesarean section  1982  . Breast lumpectomy  2000   . Endometrial ablation  09/30/2006  . Total vaginal  hysterectomy  09/27/2008    Dr. Lavon Paganini  . Esophagogastroduodenoscopy      Showed grade D. reflux esophagitis    Social History   Social History  . Marital Status: Married    Spouse Name: bobby   . Number of Children: 2  . Years of Education: 10th grade   Occupational History  .   [employer]   Social History Main Topics  . Smoking status: Never Smoker   . Smokeless tobacco: Never Used  . Alcohol Use: No  . Drug Use: No  . Sexual Activity: Not on file   Other Topics Concern  . Not on file   Social History Narrative   No regular exercise    Family History  Problem Relation Age of Onset  . Alcohol abuse Father   . Stroke Father   . Hypertension Father   . Ovarian cancer Mother   . Depression Mother   . Diabetes Mother   . Hyperlipidemia Mother   . Hypertension Mother   . COPD Mother   . Heart attack Sister 17  . Breast cancer Sister     Outpatient Encounter Prescriptions as of  06/04/2015  Medication Sig  . Albuterol Sulfate (PROAIR RESPICLICK) 108 (90 Base) MCG/ACT AEPB Inhale 1-2 puffs into the lungs every 4 (four) hours as needed.  . ALPRAZolam (XANAX) 0.5 MG tablet Take 1 tablet (0.5 mg total) by mouth 2 (two) times daily as needed.  . DULoxetine (CYMBALTA) 60 MG capsule TAKE 1 CAPSULE (60 MG TOTAL) BY MOUTH 2 (TWO) TIMES DAILY.  . fluticasone (FLONASE) 50 MCG/ACT nasal spray PLACE 2 SPRAYS INTO BOTH NOSTRILS DAILY.  Marland Kitchen. lisinopril-hydrochlorothiazide (PRINZIDE,ZESTORETIC) 20-25 MG tablet Take 1 tablet by mouth daily.  . montelukast (SINGULAIR) 10 MG tablet TAKE 1 TABLET (10 MG TOTAL) BY MOUTH AT BEDTIME.  Marland Kitchen. omeprazole (PRILOSEC) 20 MG capsule TAKE 2 CAPSULES (40 MG TOTAL) BY MOUTH DAILY.  Marland Kitchen. promethazine (PHENERGAN) 25 MG tablet Take 1 tablet (25 mg total) by mouth every 6 (six) hours as needed.  . rizatriptan (MAXALT) 10 MG tablet Take 1 tablet (10 mg total) by mouth as needed. May repeat in 2 hours if needed  . valACYclovir (VALTREX) 1000 MG tablet  Take 1 tablet (1,000 mg total) by mouth 2 (two) times daily.  . [DISCONTINUED] DULoxetine (CYMBALTA) 60 MG capsule TAKE 1 CAPSULE (60 MG TOTAL) BY MOUTH 2 (TWO) TIMES DAILY.  . [DISCONTINUED] fluticasone (FLONASE) 50 MCG/ACT nasal spray PLACE 2 SPRAYS INTO BOTH NOSTRILS DAILY.  . [DISCONTINUED] furosemide (LASIX) 20 MG tablet Take 1 tablet (20 mg total) by mouth daily as needed for edema.  . [DISCONTINUED] lisinopril-hydrochlorothiazide (PRINZIDE,ZESTORETIC) 20-25 MG per tablet Take 1 tablet by mouth daily.  . [DISCONTINUED] omeprazole (PRILOSEC) 20 MG capsule TAKE 2 CAPSULES (40 MG TOTAL) BY MOUTH DAILY.  . [DISCONTINUED] promethazine (PHENERGAN) 25 MG tablet Take 1 tablet (25 mg total) by mouth every 6 (six) hours as needed.  . [DISCONTINUED] rizatriptan (MAXALT) 10 MG tablet Take 1 tablet (10 mg total) by mouth as needed. May repeat in 2 hours if needed  . [DISCONTINUED] valACYclovir (VALTREX) 1000 MG tablet Take 1 tablet (1,000 mg total) by mouth 2 (two) times daily.  . [DISCONTINUED] azithromycin (ZITHROMAX) 250 MG tablet Take 1 tablet (250 mg total) by mouth daily. Take first 2 tablets together, then 1 every day until finished.  . [DISCONTINUED] guaiFENesin-codeine 100-10 MG/5ML syrup Take 5 mLs by mouth at bedtime as needed for cough.  . [DISCONTINUED] meclizine (ANTIVERT) 25 MG tablet Take 1 tablet (25 mg total) by mouth 3 (three) times daily as needed for dizziness or nausea.  . [DISCONTINUED] predniSONE (STERAPRED UNI-PAK 48 TAB) 10 MG (48) TBPK tablet Take by mouth daily. 12 day dosepack po   No facility-administered encounter medications on file as of 06/04/2015.           Objective:   Physical Exam  Constitutional: She is oriented to person, place, and time. She appears well-developed and well-nourished.  HENT:  Head: Normocephalic and atraumatic.  Cardiovascular: Normal rate, regular rhythm and normal heart sounds.   Pulmonary/Chest: Effort normal and breath sounds normal.   Musculoskeletal:  Lumbar spine with normal flexion, surgeon, rotation right and left and side bending. Negative straight leg raise bilaterally. Very tender over the left trochanteric bursa. With some pain just going slightly distal upon palpation. Week hip abductors. Significant pain and discomfort with external rotation of the hip.  Neurological: She is alert and oriented to person, place, and time.  Skin: Skin is warm and dry.  Psychiatric: She has a normal mood and affect. Her behavior is normal.        Assessment &  Plan:  Left hip bursitis-New problem. discussed diagnosis and treatment. Given handout on home stretches to do. Recommend continuing with heat and ice and gentle stretches. Also recommend continue with Aleve 1 pill twice a day for the next 7-10 days. If she's not improving over the next 2-3 weeks and please come in for bursa injection.  Left hip pain-New Problem. She also has signs of postural arthritis of the left hip as well. With her family history of bone cancer we'll go ahead and get an x-ray today.  Hypertension-uncontrolled. She said she minimizes salt intake in her diet. Certainly she has been on NSAIDs though that could be contributing.  Dysuria-Urinalysis does show small amount of blood but negative for nitrites and leukocytes. We'll send for culture for confirmation. Will go ahead and treat Cipro.    Groin rash - refilled nystatin/Triamcinolone cream.

## 2015-06-07 LAB — URINE CULTURE

## 2015-06-07 MED ORDER — NITROFURANTOIN MONOHYD MACRO 100 MG PO CAPS: 100.0000 mg | ORAL_CAPSULE | Freq: Two times a day (BID) | ORAL | Status: DC

## 2015-06-07 NOTE — Addendum Note (Signed)
Addended by: Nani GasserMETHENEY, CATHERINE D on: 06/07/2015 07:57 AM   Modules accepted: Orders, SmartSet

## 2015-06-28 ENCOUNTER — Other Ambulatory Visit: Payer: Self-pay | Admitting: Family Medicine

## 2015-08-21 ENCOUNTER — Ambulatory Visit (INDEPENDENT_AMBULATORY_CARE_PROVIDER_SITE_OTHER): Payer: BLUE CROSS/BLUE SHIELD | Admitting: Family Medicine

## 2015-08-21 ENCOUNTER — Telehealth: Payer: Self-pay | Admitting: Family Medicine

## 2015-08-21 ENCOUNTER — Encounter: Payer: Self-pay | Admitting: Family Medicine

## 2015-08-21 VITALS — BP 149/81 | HR 69 | Wt 208.0 lb

## 2015-08-21 DIAGNOSIS — R079 Chest pain, unspecified: Secondary | ICD-10-CM

## 2015-08-21 LAB — CBC
HCT: 39.6 % (ref 35.0–45.0)
Hemoglobin: 13.5 g/dL (ref 11.7–15.5)
MCH: 29.3 pg (ref 27.0–33.0)
MCHC: 34.1 g/dL (ref 32.0–36.0)
MCV: 86.1 fL (ref 80.0–100.0)
MPV: 9.2 fL (ref 7.5–12.5)
PLATELETS: 372 10*3/uL (ref 140–400)
RBC: 4.6 MIL/uL (ref 3.80–5.10)
RDW: 14.3 % (ref 11.0–15.0)
WBC: 10.2 10*3/uL (ref 3.8–10.8)

## 2015-08-21 LAB — CK TOTAL AND CKMB (NOT AT ARMC)
CK, MB: 0.4 ng/mL (ref 0.0–5.0)
RELATIVE INDEX: 0.8 (ref 0.0–4.0)
Total CK: 49 U/L (ref 7–177)

## 2015-08-21 LAB — TROPONIN I

## 2015-08-21 NOTE — Telephone Encounter (Signed)
Pt.notified

## 2015-08-21 NOTE — Telephone Encounter (Signed)
Will you please let patient know that her heart lab tests were all normal and it looks like she did not have any cardiac problem that caused her chest pain.  With her sister's history I still think it would be a good idea to get an up to date stress test so I'll put an order in.  This way we can rule out coronary disease 100%

## 2015-08-21 NOTE — Progress Notes (Signed)
CC: Kristen Spence is a 55 y.o. female is here for Chest Pain   Subjective: HPI: She was in her regular state of health until 8:30 this morning while at work sitting at her desk she had a sudden sharp sensation in her left chest. It was nonradiating. She felt like her face was tense and denies any other accompanying symptoms. She's never had this before. She denies any shortness of breath, cough, wheezing or change in pain with breathing or position of her torso. Symptoms lasted 30 minutes. Symptoms were moderate in severity. The only intervention was taking a aspirin and coming to see [place]. She denies any overlying skin changes. Symptoms have now 100% resolved. She denies any chest pain at the present time. She denies any other motor or sensory disturbances recently or remotely. She denies any recent trauma or overexertion. Her sister died from a heart attack at age 11, patient tells me in the past she personally has had a normal stress test. Denies fevers, chills, nor rash.   Review Of Systems Outlined In HPI  Past Medical History:  Diagnosis Date  . Benign positional vertigo   . Dizziness   . Edema   . Fatigue   . GERD (gastroesophageal reflux disease)    hiatal hernia  . Heart palpitations   . Hiatal hernia   . Hypertension   . Insomnia   . Migraine     Past Surgical History:  Procedure Laterality Date  . BREAST LUMPECTOMY  2000   . CESAREAN SECTION  1982  . CHOLECYSTECTOMY  1987  . ENDOMETRIAL ABLATION  09/30/2006  . ESOPHAGOGASTRODUODENOSCOPY     Showed grade D. reflux esophagitis  . TOTAL VAGINAL HYSTERECTOMY  09/27/2008   Dr. Lavon Paganini   Family History  Problem Relation Age of Onset  . Alcohol abuse Father   . Stroke Father   . Hypertension Father   . Ovarian cancer Mother   . Depression Mother   . Diabetes Mother   . Hyperlipidemia Mother   . Hypertension Mother   . COPD Mother   . Heart attack Sister 56  . Breast cancer Sister     Social History    Social History  . Marital status: Married    Spouse name: bobby   . Number of children: 2  . Years of education: 10th grade   Occupational History  .   [employer]   Social History Main Topics  . Smoking status: Never Smoker  . Smokeless tobacco: Never Used  . Alcohol use No  . Drug use: No  . Sexual activity: Not on file   Other Topics Concern  . Not on file   Social History Narrative   No regular exercise     Objective: BP (!) 149/81   Pulse 69   Wt 208 lb (94.3 kg)   BMI 38.04 kg/m   General: Alert and Oriented, No Acute Distress HEENT: Pupils equal, round, reactive to light. Conjunctivae clear.  Moist mucous membranes pharynx unremarkable Lungs: Clear to auscultation bilaterally, no wheezing/ronchi/rales.  Comfortable work of breathing. Good air movement. Cardiac: Regular rate and rhythm. Normal S1/S2.  No murmurs, rubs, nor gallops.   Extremities: No peripheral edema.  Strong peripheral pulses.  Mental Status: No depression, anxiety, nor agitation. Skin: Warm and dry.  Assessment & Plan: Kristen Spence was seen today for chest pain.  Diagnoses and all orders for this visit:  Chest pain, unspecified chest pain type -     EKG 12-Lead -  CBC -     Troponin I -     CK Total (and CKMB)   EKG was obtained showing normal sinus rhythm, left axis deviation, no pathologic Q waves and no ST segment elevation or depression. EKG is identical to an EKG obtained in October 2016 other than the beats per minute of 66 today. I do not believe that she is suffering from a heart attack while sitting here with me, I would like to get a troponin and CK to screen if there was any cardiac etiology to her chest pain or this was muscular skeletal or esophageal.  Signs and symptoms requring emergent/urgent reevaluation were discussed with the patient. Multiple plan based on the above results.  Return if symptoms worsen or fail to improve.

## 2015-08-29 ENCOUNTER — Telehealth: Payer: Self-pay | Admitting: Family Medicine

## 2015-08-29 NOTE — Telephone Encounter (Signed)
Pt called clinic stating she had a referral placed for cardiology and was schedule for an appointment in a [place] office. Pt states [facility] [place] [facility] and questions if referral and appointment can be changed. Will route to referral coordinator for review.

## 2015-09-04 NOTE — Telephone Encounter (Signed)
Will route to referral coordinator

## 2015-09-04 NOTE — Telephone Encounter (Signed)
It looks like is for a stress test.  She is not actually seeing the Cardiologist per se.  Ok to schedule here in [place] at hospital if she prefers.

## 2015-09-04 NOTE — Telephone Encounter (Signed)
I called and cancelled appointment with [facility] and sent to [facility]. - CF

## 2015-09-20 DIAGNOSIS — R079 Chest pain, unspecified: Secondary | ICD-10-CM | POA: Diagnosis not present

## 2015-10-07 ENCOUNTER — Encounter: Payer: BLUE CROSS/BLUE SHIELD | Admitting: Family Medicine

## 2015-10-14 ENCOUNTER — Other Ambulatory Visit: Payer: Self-pay | Admitting: Family Medicine

## 2015-10-14 DIAGNOSIS — Z1231 Encounter for screening mammogram for malignant neoplasm of breast: Secondary | ICD-10-CM

## 2015-10-28 ENCOUNTER — Other Ambulatory Visit: Payer: Self-pay | Admitting: Family Medicine

## 2015-11-01 ENCOUNTER — Ambulatory Visit (INDEPENDENT_AMBULATORY_CARE_PROVIDER_SITE_OTHER): Payer: BLUE CROSS/BLUE SHIELD

## 2015-11-01 ENCOUNTER — Encounter: Payer: Self-pay | Admitting: Family Medicine

## 2015-11-01 ENCOUNTER — Ambulatory Visit (INDEPENDENT_AMBULATORY_CARE_PROVIDER_SITE_OTHER): Payer: BLUE CROSS/BLUE SHIELD | Admitting: Family Medicine

## 2015-11-01 ENCOUNTER — Other Ambulatory Visit: Payer: Self-pay | Admitting: Family Medicine

## 2015-11-01 VITALS — BP 150/86 | HR 68 | Ht 62.0 in | Wt 213.0 lb

## 2015-11-01 DIAGNOSIS — Z0000 Encounter for general adult medical examination without abnormal findings: Secondary | ICD-10-CM | POA: Diagnosis not present

## 2015-11-01 DIAGNOSIS — Z1231 Encounter for screening mammogram for malignant neoplasm of breast: Secondary | ICD-10-CM

## 2015-11-01 DIAGNOSIS — I10 Essential (primary) hypertension: Secondary | ICD-10-CM | POA: Diagnosis not present

## 2015-11-01 DIAGNOSIS — N6459 Other signs and symptoms in breast: Secondary | ICD-10-CM

## 2015-11-01 MED ORDER — AMLODIPINE BESYLATE 5 MG PO TABS: 5.0000 mg | ORAL_TABLET | Freq: Every day | ORAL | 3 refills | Status: DC

## 2015-11-01 MED ORDER — DULOXETINE HCL 60 MG PO CPEP: ORAL_CAPSULE | ORAL | 0 refills | Status: DC

## 2015-11-01 MED ORDER — RIZATRIPTAN BENZOATE 10 MG PO TABS: 10.0000 mg | ORAL_TABLET | ORAL | 4 refills | Status: DC | PRN

## 2015-11-01 MED ORDER — LISINOPRIL-HYDROCHLOROTHIAZIDE 20-25 MG PO TABS: 1.0000 | ORAL_TABLET | Freq: Every day | ORAL | 1 refills | Status: DC

## 2015-11-01 MED ORDER — OMEPRAZOLE 20 MG PO CPDR: 40.0000 mg | DELAYED_RELEASE_CAPSULE | Freq: Every day | ORAL | 1 refills | Status: DC

## 2015-11-01 MED ORDER — MONTELUKAST SODIUM 10 MG PO TABS: ORAL_TABLET | ORAL | 1 refills | Status: DC

## 2015-11-01 NOTE — Progress Notes (Signed)
Subjective:     Kristen Spence is a 55 y.o. female and is here for a comprehensive physical exam. The patient reports no problems. Noticed her right nipple was inverted on the the right about a month ago.    Social History   Social History  . Marital status: Married    Spouse name: bobby   . Number of children: 2  . Years of education: 10th grade   Occupational History  .   [employer]   Social History Main Topics  . Smoking status: Never Smoker  . Smokeless tobacco: Never Used  . Alcohol use No  . Drug use: No  . Sexual activity: Not on file   Other Topics Concern  . Not on file   Social History Narrative   No regular exercise   Health Maintenance  Topic Date Due  . Hepatitis C Screening  [birth year]  . HIV Screening  12/15/1976  . MAMMOGRAM  10/20/2011  . COLONOSCOPY  02/20/2015  . INFLUENZA VACCINE  03/03/2016 (Originally 08/20/2015)  . TETANUS/TDAP  01/20/2016    The following portions of the patient's history were reviewed and updated as appropriate: allergies, current medications, past family history, past medical history, past social history, past surgical history and problem list.  Review of Systems A comprehensive review of systems was negative.   Objective:    BP (!) 150/86   Pulse 68   Ht 5\' 2"  (1.575 m)   Wt 213 lb (96.6 kg)   SpO2 99%   BMI 38.96 kg/m   Physical Exam  Constitutional: She is oriented to person, place, and time. She appears well-developed and well-nourished.  HENT:  Head: Normocephalic and atraumatic.  Right Ear: External ear normal.  Left Ear: External ear normal.  Nose: Nose normal.  Mouth/Throat: Oropharynx is clear and moist.  TMs and canals are clear.   Eyes: Conjunctivae and EOM are normal. Pupils are equal, round, and reactive to light.  Neck: Neck supple. No thyromegaly present.  Cardiovascular: Normal rate, regular rhythm and normal heart sounds.   Pulmonary/Chest: Effort normal and breath sounds normal. She has  no wheezes.  Right nipple is inverted but no nodules or masses. No discharge from the nipple.   Abdominal: Soft. Bowel sounds are normal. She exhibits no distension and no mass. There is no tenderness. There is no rebound and no guarding. No hernia.  Lymphadenopathy:    She has no cervical adenopathy.  Neurological: She is alert and oriented to person, place, and time.  Skin: Skin is warm and dry.  Psychiatric: She has a normal mood and affect.      Assessment:    Healthy female exam.      Plan:     See After Visit Summary for Counseling Recommendations   Keep up a regular exercise program and make sure you are eating a healthy diet Try to eat 4 servings of dairy a day, or if you are lactose intolerant take a calcium with vitamin D daily.  Your vaccines are up to date.  Due for labs.   Right inverted nipple. She actually went for a screening mammo this morning but she really needs a diagnostic and an US.  [facility].   Hypertension-blood pressure elevated today but patient was stressed. Recommend follow-up in one month to recheck.

## 2015-11-02 LAB — LIPID PANEL
CHOL/HDL RATIO: 3.6 ratio (ref <=5.0)
CHOLESTEROL: 174 mg/dL (ref 125–200)
HDL: 48 mg/dL (ref >=46)
LDL Cholesterol: 109 mg/dL (ref <130)
TRIGLYCERIDES: 87 mg/dL (ref <150)
VLDL: 17 mg/dL (ref <30)

## 2015-11-02 LAB — COMPLETE METABOLIC PANEL WITH GFR
ALBUMIN: 4.1 g/dL (ref 3.6–5.1)
ALK PHOS: 74 U/L (ref 33–130)
ALT: 18 U/L (ref 6–29)
AST: 17 U/L (ref 10–35)
BILIRUBIN TOTAL: 0.6 mg/dL (ref 0.2–1.2)
BUN: 13 mg/dL (ref 7–25)
CALCIUM: 9.6 mg/dL (ref 8.6–10.4)
CO2: 24 mmol/L (ref 20–31)
Chloride: 107 mmol/L (ref 98–110)
Creat: 0.74 mg/dL (ref 0.50–1.05)
Glucose, Bld: 90 mg/dL (ref 65–99)
POTASSIUM: 4.4 mmol/L (ref 3.5–5.3)
SODIUM: 143 mmol/L (ref 135–146)
Total Protein: 7.2 g/dL (ref 6.1–8.1)

## 2015-11-02 LAB — VITAMIN D 25 HYDROXY (VIT D DEFICIENCY, FRACTURES): Vit D, 25-Hydroxy: 20 ng/mL — ABNORMAL LOW (ref 30–100)

## 2015-11-02 LAB — TSH: TSH: 2.95 mIU/L

## 2015-11-04 DIAGNOSIS — N6341 Unspecified lump in right breast, subareolar: Secondary | ICD-10-CM | POA: Diagnosis not present

## 2015-11-04 DIAGNOSIS — N6459 Other signs and symptoms in breast: Secondary | ICD-10-CM | POA: Diagnosis not present

## 2015-11-04 LAB — HM MAMMOGRAPHY

## 2015-11-06 ENCOUNTER — Encounter: Payer: Self-pay | Admitting: Family Medicine

## 2015-11-06 DIAGNOSIS — R229 Localized swelling, mass and lump, unspecified: Secondary | ICD-10-CM | POA: Diagnosis not present

## 2015-11-06 DIAGNOSIS — N6489 Other specified disorders of breast: Secondary | ICD-10-CM | POA: Diagnosis not present

## 2015-11-06 DIAGNOSIS — N6310 Unspecified lump in the right breast, unspecified quadrant: Secondary | ICD-10-CM | POA: Diagnosis not present

## 2015-11-08 ENCOUNTER — Encounter: Payer: Self-pay | Admitting: Family Medicine

## 2015-11-20 ENCOUNTER — Encounter: Payer: Self-pay | Admitting: Obstetrics & Gynecology

## 2015-11-20 ENCOUNTER — Ambulatory Visit (INDEPENDENT_AMBULATORY_CARE_PROVIDER_SITE_OTHER): Payer: BLUE CROSS/BLUE SHIELD | Admitting: Obstetrics & Gynecology

## 2015-11-20 DIAGNOSIS — I10 Essential (primary) hypertension: Secondary | ICD-10-CM

## 2015-11-20 DIAGNOSIS — Z8041 Family history of malignant neoplasm of ovary: Secondary | ICD-10-CM

## 2015-11-20 DIAGNOSIS — Z0000 Encounter for general adult medical examination without abnormal findings: Secondary | ICD-10-CM

## 2015-11-20 DIAGNOSIS — Z01419 Encounter for gynecological examination (general) (routine) without abnormal findings: Secondary | ICD-10-CM | POA: Diagnosis not present

## 2015-11-20 NOTE — Progress Notes (Signed)
Subjective:     Kristen Spence is a 55 y.o. female here for a routine exam.  Current complaints: none.  She didn't know if she needed a pa smear anymore.  [facility] for fibroids nad bleeding.  Pt has never had an abnml pap.  No cryo, no LEEP.  Pt recently developed right inverted nipple.  Had mammogram and then breast biopsy.  All this negative and done at [facility] per patient.   Gynecologic History No LMP recorded. Patient has had a hysterectomy. Contraception: status post hysterectomy Last Pap: several years ago--nml per pateient Last mammogram: nml (see above)  2017.  Obstetric History OB History  Gravida Para Term Preterm AB Living  4 2 2   2     SAB TAB Ectopic Multiple Live Births  2       2    # Outcome Date GA Lbr Len/2nd Weight Sex Delivery Anes PTL Lv  4 SAB           3 SAB           2 Term      CS-LTranv     1 Term      CS-LTranv          The following portions of the patient's history were reviewed and updated as appropriate: allergies, current medications, past family history, past medical history, past social history, past surgical history and problem list.  Review of Systems Pertinent items noted in HPI and remainder of comprehensive ROS otherwise negative.    Objective:      Vitals:   11/20/15 0902  BP: (!) 167/92  Pulse: 71  Resp: 16  Weight: 212 lb (96.2 kg)  Height: 5\' 2"  (1.575 m)   Vitals:  WNL General appearance: alert, cooperative and no distress  HEENT: Normocephalic, without obvious abnormality, atraumatic Eyes: negative Throat: lips, mucosa, and tongue normal; teeth and gums normal  Respiratory: Clear to auscultation bilaterally  CV: Regular rate and rhythm  Breasts:  Normal appearance, no masses or tenderness, no nipple retraction or dimpling  GI: Soft, non-tender; bowel sounds normal; no masses,  no organomegaly  GU: External Genitalia:  Tanner V, no lesion Urethra:  No prolapse   Vagina: Pink, normal rugae, no blood  or discharge; no atrophy  Cervix: Surgically absent  Uterus:  Surgically absent  Adnexa: Normal, no masses, non tender; remaining ovary not palpated with certainty  Musculoskeletal: No edema, redness or tenderness in the calves or thighs  Skin: No lesions or rash  Lymphatic: Axillary adenopathy: none    Psychiatric: Normal mood and behavior   Assessment:    Healthy female exam.   Family history of ovarain and breast cancer Uncontrolled hypertension   Plan:    Paps not needed due to hysterectomy.  Detailed discussion with patient about ASCCP guidelines  Suggest MY Risk genetic testing--pt will think about this; detailed discussion around risks if positive.   F/U with Dr. Darra LisMethaney for HTN  F/U with mammogram as instructed by Dr. Darra LisMethaney

## 2015-12-21 ENCOUNTER — Other Ambulatory Visit: Payer: Self-pay | Admitting: Family Medicine

## 2015-12-23 ENCOUNTER — Emergency Department
Admission: EM | Admit: 2015-12-23 | Discharge: 2015-12-23 | Discharge status: Absent without leave | Payer: BLUE CROSS/BLUE SHIELD | Source: Home / Self Care

## 2015-12-24 ENCOUNTER — Ambulatory Visit: Payer: BLUE CROSS/BLUE SHIELD | Admitting: Family Medicine

## 2016-03-08 ENCOUNTER — Other Ambulatory Visit: Payer: Self-pay | Admitting: Family Medicine

## 2016-03-16 ENCOUNTER — Telehealth: Payer: Self-pay

## 2016-03-16 NOTE — Telephone Encounter (Signed)
Not sure why. We can change to 90 days.  Nursing approved it.  She she does need to schedule a follow-up for blood pressure as it has been high the last couple times.

## 2016-03-18 MED ORDER — DULOXETINE HCL 60 MG PO CPEP
60.0000 mg | ORAL_CAPSULE | Freq: Two times a day (BID) | ORAL | 1 refills | Status: DC
Start: 2016-03-18 — End: 2016-10-06

## 2016-03-18 NOTE — Telephone Encounter (Signed)
Left message advising patient to schedule a follow up appointment for blood pressure. 90 day sent to pharmacy.

## 2016-03-30 ENCOUNTER — Other Ambulatory Visit: Payer: Self-pay | Admitting: *Deleted

## 2016-03-30 MED ORDER — RIZATRIPTAN BENZOATE 10 MG PO TABS: 10.0000 mg | ORAL_TABLET | ORAL | 4 refills | Status: DC | PRN

## 2016-03-31 NOTE — Telephone Encounter (Signed)
Letter sent.

## 2016-06-03 ENCOUNTER — Other Ambulatory Visit: Payer: Self-pay | Admitting: Family Medicine

## 2016-10-06 ENCOUNTER — Encounter: Payer: Self-pay | Admitting: Family Medicine

## 2016-10-06 ENCOUNTER — Ambulatory Visit (INDEPENDENT_AMBULATORY_CARE_PROVIDER_SITE_OTHER): Payer: BLUE CROSS/BLUE SHIELD | Admitting: Family Medicine

## 2016-10-06 VITALS — BP 152/86 | HR 80 | Ht 62.0 in | Wt 225.0 lb

## 2016-10-06 DIAGNOSIS — I10 Essential (primary) hypertension: Secondary | ICD-10-CM

## 2016-10-06 DIAGNOSIS — G43109 Migraine with aura, not intractable, without status migrainosus: Secondary | ICD-10-CM

## 2016-10-06 DIAGNOSIS — M898X9 Other specified disorders of bone, unspecified site: Secondary | ICD-10-CM

## 2016-10-06 DIAGNOSIS — F324 Major depressive disorder, single episode, in partial remission: Secondary | ICD-10-CM

## 2016-10-06 DIAGNOSIS — E559 Vitamin D deficiency, unspecified: Secondary | ICD-10-CM

## 2016-10-06 MED ORDER — OMEPRAZOLE 20 MG PO CPDR: 40.0000 mg | DELAYED_RELEASE_CAPSULE | Freq: Every day | ORAL | 1 refills | Status: DC

## 2016-10-06 MED ORDER — OLMESARTAN-AMLODIPINE-HCTZ 40-10-25 MG PO TABS: 1.0000 | ORAL_TABLET | Freq: Every day | ORAL | 0 refills | Status: DC

## 2016-10-06 MED ORDER — MONTELUKAST SODIUM 10 MG PO TABS: ORAL_TABLET | ORAL | 1 refills | Status: DC

## 2016-10-06 MED ORDER — RIZATRIPTAN BENZOATE 10 MG PO TABS: 10.0000 mg | ORAL_TABLET | ORAL | 4 refills | Status: DC | PRN

## 2016-10-06 MED ORDER — DULOXETINE HCL 60 MG PO CPEP: 60.0000 mg | ORAL_CAPSULE | Freq: Two times a day (BID) | ORAL | 1 refills | Status: DC

## 2016-10-06 NOTE — Progress Notes (Signed)
Subjective:    CC: HTN   HPI:  Hypertension- Pt denies chest pain, SOB, dizziness, or heart palpitations.  Taking meds as directed w/o problems.  Denies medication side effects.  Her blood pressures have been high at home and at work. That she's also been under a lot of stress. She's been having more frequent headaches but no chest pain.  Depression f/u - Currently on Cymbalta. Does complain of little interest or pleasure doing things more than half the days and feeling down several days a week. She also complains of difficulty concentrating as well as low energy. She does use alprazolam as needed but sparingly. Work has been extremely stressful. They have cut the staff back by about a third and she's been working long hours and typically 6 days per week.  She also complains that she's been feeling pain in her bones. Particularly over her wrists hands hips and legs. Not been able to exercise and has been working long hours daily. She denies any distinct joint swelling or redness.  Migraine headaches-requesting refill on Maxalt. Doing well overall. She has had more frequent headaches feels like a more related to her elevated blood pressure levels.  Past medical history, Surgical history, Family history not pertinant except as noted below, Social history, Allergies, and medications have been entered into the medical record, reviewed, and corrections made.   Review of Systems: No fevers, chills, night sweats, weight loss, chest pain, or shortness of breath.   Objective:    General: Well Developed, well nourished, and in no acute distress.  Neuro: Alert and oriented x3, extra-ocular muscles intact, sensation grossly intact.  HEENT: Normocephalic, atraumatic  Skin: Warm and dry, no rashes. Cardiac: Regular rate and rhythm, no murmurs rubs or gallops, no lower extremity edema.  Respiratory: Clear to auscultation bilaterally. Not using accessory muscles, speaking in full sentences.   Impression  and Recommendations:   HTN - Well controlled. Continue current regimen. Follow up in  6 months. Due for CMP and lipid panel.   Depression-overall doing okay. He just sounds like her work situation is extremely stressful. She is Dr. her husband about possibly trying to find a new job which I think overall would be significantly helpful for her. I suspect that this is probably been contributing to some of her diffuse pain that she's been experiencing.   Diffuse "bone" pain-unclear etiology at this point. She has had low vitamin D in the past several recommend that we recheck that. I think working on reducing her stress, getting adequate rest and regular exercise will probably make a big difference.    Migraine headaches-refilled the Maxalt today.will try to get BP down and se if helps with HA before prophylaxis.

## 2016-10-07 ENCOUNTER — Ambulatory Visit: Payer: BLUE CROSS/BLUE SHIELD | Admitting: Family Medicine

## 2016-10-08 DIAGNOSIS — I10 Essential (primary) hypertension: Secondary | ICD-10-CM | POA: Diagnosis not present

## 2016-10-08 DIAGNOSIS — E559 Vitamin D deficiency, unspecified: Secondary | ICD-10-CM | POA: Diagnosis not present

## 2016-10-08 DIAGNOSIS — M898X9 Other specified disorders of bone, unspecified site: Secondary | ICD-10-CM | POA: Diagnosis not present

## 2016-10-09 LAB — COMPLETE METABOLIC PANEL WITH GFR
AG RATIO: 1.4 (calc) (ref 1.0–2.5)
ALKALINE PHOSPHATASE (APISO): 80 U/L (ref 33–130)
ALT: 17 U/L (ref 6–29)
AST: 14 U/L (ref 10–35)
Albumin: 4 g/dL (ref 3.6–5.1)
BILIRUBIN TOTAL: 0.5 mg/dL (ref 0.2–1.2)
BUN: 11 mg/dL (ref 7–25)
CHLORIDE: 108 mmol/L (ref 98–110)
CO2: 26 mmol/L (ref 20–32)
Calcium: 9.3 mg/dL (ref 8.6–10.4)
Creat: 0.75 mg/dL (ref 0.50–1.05)
GFR, Est African American: 105 mL/min/{1.73_m2} (ref >=60)
GFR, Est Non African American: 90 mL/min/{1.73_m2} (ref >=60)
Globulin: 2.9 g/dL (calc) (ref 1.9–3.7)
Glucose, Bld: 95 mg/dL (ref 65–99)
POTASSIUM: 4 mmol/L (ref 3.5–5.3)
Sodium: 141 mmol/L (ref 135–146)
TOTAL PROTEIN: 6.9 g/dL (ref 6.1–8.1)

## 2016-10-09 LAB — CBC
HEMATOCRIT: 38.7 % (ref 35.0–45.0)
HEMOGLOBIN: 13.2 g/dL (ref 11.7–15.5)
MCH: 28.7 pg (ref 27.0–33.0)
MCHC: 34.1 g/dL (ref 32.0–36.0)
MCV: 84.1 fL (ref 80.0–100.0)
MPV: 9.7 fL (ref 7.5–12.5)
Platelets: 387 10*3/uL (ref 140–400)
RBC: 4.6 10*6/uL (ref 3.80–5.10)
RDW: 13.6 % (ref 11.0–15.0)
WBC: 9.4 10*3/uL (ref 3.8–10.8)

## 2016-10-09 LAB — LIPID PANEL W/REFLEX DIRECT LDL
CHOLESTEROL: 170 mg/dL (ref <200)
HDL: 47 mg/dL — ABNORMAL LOW (ref >50)
LDL Cholesterol (Calc): 106 mg/dL (calc) — ABNORMAL HIGH
Non-HDL Cholesterol (Calc): 123 mg/dL (calc) (ref <130)
TRIGLYCERIDES: 81 mg/dL (ref <150)
Total CHOL/HDL Ratio: 3.6 (calc) (ref <5.0)

## 2016-10-09 LAB — VITAMIN D 25 HYDROXY (VIT D DEFICIENCY, FRACTURES): VIT D 25 HYDROXY: 19 ng/mL — AB (ref 30–100)

## 2016-10-09 LAB — TSH: TSH: 2.85 mIU/L

## 2016-11-02 ENCOUNTER — Other Ambulatory Visit: Payer: Self-pay | Admitting: Family Medicine

## 2016-11-02 DIAGNOSIS — Z1231 Encounter for screening mammogram for malignant neoplasm of breast: Secondary | ICD-10-CM

## 2016-11-03 ENCOUNTER — Ambulatory Visit (HOSPITAL_BASED_OUTPATIENT_CLINIC_OR_DEPARTMENT_OTHER)
Admission: RE | Admit: 2016-11-03 | Discharge: 2016-11-03 | Disposition: A | Discharge status: Home / Self Care | Payer: BLUE CROSS/BLUE SHIELD | Source: Ambulatory Visit | Attending: Family Medicine | Admitting: Family Medicine

## 2016-11-03 ENCOUNTER — Encounter (HOSPITAL_BASED_OUTPATIENT_CLINIC_OR_DEPARTMENT_OTHER): Payer: Self-pay

## 2016-11-03 DIAGNOSIS — Z1231 Encounter for screening mammogram for malignant neoplasm of breast: Secondary | ICD-10-CM | POA: Insufficient documentation

## 2016-11-04 ENCOUNTER — Other Ambulatory Visit: Payer: Self-pay | Admitting: Family Medicine

## 2016-11-04 DIAGNOSIS — R928 Other abnormal and inconclusive findings on diagnostic imaging of breast: Secondary | ICD-10-CM

## 2016-11-06 ENCOUNTER — Ambulatory Visit: Payer: BLUE CROSS/BLUE SHIELD

## 2016-11-06 ENCOUNTER — Ambulatory Visit
Admission: RE | Admit: 2016-11-06 | Discharge: 2016-11-06 | Disposition: A | Discharge status: Home / Self Care | Payer: BLUE CROSS/BLUE SHIELD | Source: Ambulatory Visit | Attending: Family Medicine | Admitting: Family Medicine

## 2016-11-06 DIAGNOSIS — R928 Other abnormal and inconclusive findings on diagnostic imaging of breast: Secondary | ICD-10-CM

## 2017-01-05 ENCOUNTER — Telehealth: Payer: Self-pay | Admitting: Family Medicine

## 2017-01-05 DIAGNOSIS — B9789 Other viral agents as the cause of diseases classified elsewhere: Secondary | ICD-10-CM | POA: Diagnosis not present

## 2017-01-05 DIAGNOSIS — R6889 Other general symptoms and signs: Secondary | ICD-10-CM | POA: Diagnosis not present

## 2017-01-05 DIAGNOSIS — I10 Essential (primary) hypertension: Secondary | ICD-10-CM | POA: Diagnosis not present

## 2017-01-05 DIAGNOSIS — R6883 Chills (without fever): Secondary | ICD-10-CM | POA: Diagnosis not present

## 2017-01-05 DIAGNOSIS — J329 Chronic sinusitis, unspecified: Secondary | ICD-10-CM | POA: Diagnosis not present

## 2017-01-05 NOTE — Telephone Encounter (Signed)
FYI... Pt called today wanting to be seen because she had a fever of 101, face hurts, voice change and drainage.  We were unable to see her here because we had not appointments available.. I spoke to Marylene LandAngela, LPN and she advised that patient go to Urgent Care and to stress to patient she must be seen. Patient said the cost of going to UC is too high and she was calling her insurance company to see what they recommend.

## 2017-01-17 DIAGNOSIS — R600 Localized edema: Secondary | ICD-10-CM | POA: Diagnosis not present

## 2017-01-17 DIAGNOSIS — R070 Pain in throat: Secondary | ICD-10-CM | POA: Diagnosis not present

## 2017-01-17 DIAGNOSIS — M25471 Effusion, right ankle: Secondary | ICD-10-CM | POA: Diagnosis not present

## 2017-01-17 DIAGNOSIS — Z79899 Other long term (current) drug therapy: Secondary | ICD-10-CM | POA: Diagnosis not present

## 2017-01-17 DIAGNOSIS — M25562 Pain in left knee: Secondary | ICD-10-CM | POA: Diagnosis not present

## 2017-01-17 DIAGNOSIS — J029 Acute pharyngitis, unspecified: Secondary | ICD-10-CM | POA: Diagnosis not present

## 2017-01-17 DIAGNOSIS — Z888 Allergy status to other drugs, medicaments and biological substances status: Secondary | ICD-10-CM | POA: Diagnosis not present

## 2017-01-17 DIAGNOSIS — M1712 Unilateral primary osteoarthritis, left knee: Secondary | ICD-10-CM | POA: Diagnosis not present

## 2017-01-17 DIAGNOSIS — J069 Acute upper respiratory infection, unspecified: Secondary | ICD-10-CM | POA: Diagnosis not present

## 2017-01-17 DIAGNOSIS — R05 Cough: Secondary | ICD-10-CM | POA: Diagnosis not present

## 2017-01-17 DIAGNOSIS — Z91018 Allergy to other foods: Secondary | ICD-10-CM | POA: Diagnosis not present

## 2017-01-17 DIAGNOSIS — M7989 Other specified soft tissue disorders: Secondary | ICD-10-CM | POA: Diagnosis not present

## 2017-01-17 DIAGNOSIS — M25462 Effusion, left knee: Secondary | ICD-10-CM | POA: Diagnosis not present

## 2017-01-17 DIAGNOSIS — I10 Essential (primary) hypertension: Secondary | ICD-10-CM | POA: Diagnosis not present

## 2017-01-26 ENCOUNTER — Ambulatory Visit (INDEPENDENT_AMBULATORY_CARE_PROVIDER_SITE_OTHER): Payer: BLUE CROSS/BLUE SHIELD | Admitting: Family Medicine

## 2017-01-26 ENCOUNTER — Encounter: Payer: Self-pay | Admitting: Family Medicine

## 2017-01-26 VITALS — BP 141/84 | HR 78 | Ht 62.0 in | Wt 225.0 lb

## 2017-01-26 DIAGNOSIS — I10 Essential (primary) hypertension: Secondary | ICD-10-CM | POA: Diagnosis not present

## 2017-01-26 DIAGNOSIS — R053 Chronic cough: Secondary | ICD-10-CM

## 2017-01-26 DIAGNOSIS — R05 Cough: Secondary | ICD-10-CM | POA: Diagnosis not present

## 2017-01-26 MED ORDER — PREDNISONE 20 MG PO TABS: 20.0000 mg | ORAL_TABLET | Freq: Every day | ORAL | 0 refills | Status: DC

## 2017-01-26 MED ORDER — IPRATROPIUM BROMIDE 0.03 % NA SOLN: 2.0000 | Freq: Two times a day (BID) | NASAL | 0 refills | Status: DC

## 2017-01-26 MED ORDER — DOXYCYCLINE HYCLATE 100 MG PO TABS: 100.0000 mg | ORAL_TABLET | Freq: Two times a day (BID) | ORAL | 0 refills | Status: DC

## 2017-01-26 MED ORDER — HYDROCODONE-HOMATROPINE 5-1.5 MG/5ML PO SYRP: 5.0000 mL | ORAL_SOLUTION | Freq: Four times a day (QID) | ORAL | 0 refills | Status: DC | PRN

## 2017-01-26 NOTE — Progress Notes (Signed)
Subjective:    Patient ID: Kristen Spence, female    DOB: [birth year], 57 y.o.   MRN: [id number]  HPI 57 year old female with a hx of GERD on BID PPI who comes in today complaining of a persistent cough for 1 month.  She describes the cough is mostly dry.  She is also had some ear pressure and facila pressure and post nasal drip.  She went to [facility] on Dec 17th and tested negative for the flu.  She had laryngitis at that time. Took Mucinex at that time.  She then suddenly started to experience some left knee and right ankle swelling so went to [facility] on December 30 for the symptoms.  They felt like she had an upper respiratory infection.  Prescribed Tamiflu, that she says she was never given the prescription and gave her some cough syrup as well as prednisone.  Also experiencing some lower extremity swelling at the time so they remove the amlodipine from her blood pressure medication.  Switched her to lisinopril HCT.  Notes reviewed.    Review of Systems   BP (!) 141/84   Pulse 78   Ht 5\' 2"  (1.575 m)   Wt 225 lb (102.1 kg)   SpO2 100%   BMI 41.15 kg/m     Allergies  Allergen Reactions  . Banana   . Clonazepam Other (See Comments)    Makes her feel weird.     Past Medical History:  Diagnosis Date  . Benign positional vertigo   . Dizziness   . Edema   . Fatigue   . GERD (gastroesophageal reflux disease)    hiatal hernia  . Heart palpitations   . Hiatal hernia   . Hypertension   . Insomnia   . Mental disorder   . Migraine     Past Surgical History:  Procedure Laterality Date  . BREAST BIOPSY    . BREAST EXCISIONAL BIOPSY Left   . BREAST LUMPECTOMY  2000   . CESAREAN SECTION  1982  . CHOLECYSTECTOMY  1987  . ENDOMETRIAL ABLATION  09/30/2006  . ESOPHAGOGASTRODUODENOSCOPY     Showed grade D. reflux esophagitis  . TOTAL VAGINAL HYSTERECTOMY  09/27/2008   Dr. Lavon Paganini    Social History   Socioeconomic History  . Marital status: Married   Spouse name: bobby   . Number of children: 2  . Years of education: 10th grade  . Highest education level: Not on file  Social Needs  . Financial resource strain: Not on file  . Food insecurity - worry: Not on file  . Food insecurity - inability: Not on file  . Transportation needs - medical: Not on file  . Transportation needs - non-medical: Not on file  Occupational History  . Occupation:      Employer: [employer]  Tobacco Use  . Smoking status: Never Smoker  . Smokeless tobacco: Never Used  Substance and Sexual Activity  . Alcohol use: No  . Drug use: No  . Sexual activity: No    Birth control/protection: Surgical  Other Topics Concern  . Not on file  Social History Narrative   No regular exercise    Family History  Problem Relation Age of Onset  . Alcohol abuse Father   . Stroke Father   . Hypertension Father   . Ovarian cancer Mother   . Depression Mother   . Diabetes Mother   . Hyperlipidemia Mother   . Hypertension Mother   . COPD  Mother   . Heart attack Sister [id number]  . Breast cancer Sister     Outpatient Encounter Medications as of 01/26/2017  Medication Sig  . ALPRAZolam (XANAX) 0.5 MG tablet Take 0.5 mg by mouth.  . cholecalciferol (VITAMIN D) 1000 units tablet Take 2,000 Units by mouth daily.  . DULoxetine (CYMBALTA) 60 MG capsule Take 1 capsule (60 mg total) by mouth 2 (two) times daily.  Marland Kitchen. lisinopril-hydrochlorothiazide (PRINZIDE,ZESTORETIC) 20-25 MG tablet Take 1 tablet by mouth daily.  . montelukast (SINGULAIR) 10 MG tablet TAKE 1 TABLET (10 MG TOTAL) BY MOUTH AT BEDTIME.  Marland Kitchen. omeprazole (PRILOSEC) 20 MG capsule Take 2 capsules (40 mg total) by mouth daily.  . rizatriptan (MAXALT) 10 MG tablet Take 1 tablet (10 mg total) by mouth as needed. May repeat in 2 hours if needed  . doxycycline (VIBRA-TABS) 100 MG tablet Take 1 tablet (100 mg total) by mouth 2 (two) times daily.  Marland Kitchen. HYDROcodone-homatropine (HYCODAN) 5-1.5 MG/5ML syrup Take 5 mLs by mouth every  6 (six) hours as needed for cough.  Marland Kitchen. ipratropium (ATROVENT) 0.03 % nasal spray Place 2 sprays into both nostrils every 12 (twelve) hours.  . predniSONE (DELTASONE) 20 MG tablet Take 1 tablet (20 mg total) by mouth daily with breakfast.  . [DISCONTINUED] Olmesartan-Amlodipine-HCTZ 40-10-25 MG TABS Take 1 tablet by mouth daily.   No facility-administered encounter medications on file as of 01/26/2017.          Objective:   Physical Exam  Constitutional: She is oriented to person, place, and time. She appears well-developed and well-nourished.  HENT:  Head: Normocephalic and atraumatic.  Right Ear: External ear normal.  Left Ear: External ear normal.  Nose: Nose normal.  Mouth/Throat: Oropharynx is clear and moist.  TMs and canals are clear.   Eyes: Conjunctivae and EOM are normal. Pupils are equal, round, and reactive to light.  Neck: Neck supple. No thyromegaly present.  Cardiovascular: Normal rate, regular rhythm and normal heart sounds.  Pulmonary/Chest: Effort normal and breath sounds normal. She has no wheezes.  Lymphadenopathy:    She has no cervical adenopathy.  Neurological: She is alert and oriented to person, place, and time.  Skin: Skin is warm and dry.  Psychiatric: She has a normal mood and affect.       Assessment & Plan:  Cough-likely due to bacterial bronchitis at this point she is had persistent symptoms for almost a month and is not getting better.  We will go ahead and treat with doxycycline and low-dose prednisone for 1 week.  Also given a prescription for cough syrup to use just at bedtime.  Also to help with the postnasal drainage we will try Atrovent nasal spray.  She Artie tried a week of a nasal steroid spray and did not get any relief.  Hypertension-uncontrolled.  I am not clear exactly why they changed her medication I am assuming that they discontinue the amlodipine because of lower extremity swelling though typically it does not cause different joints to  swell it usually just causes dependent edema so we may end up switching her back.

## 2017-01-26 NOTE — Patient Instructions (Addendum)
Call on Monday if not better.

## 2017-02-22 ENCOUNTER — Other Ambulatory Visit: Payer: Self-pay | Admitting: Family Medicine

## 2017-02-23 NOTE — Telephone Encounter (Signed)
Did you want patient to continue to take the Atrovent?

## 2017-04-16 ENCOUNTER — Other Ambulatory Visit: Payer: Self-pay | Admitting: Family Medicine

## 2017-05-20 ENCOUNTER — Other Ambulatory Visit: Payer: Self-pay | Admitting: Family Medicine

## 2017-05-20 DIAGNOSIS — F324 Major depressive disorder, single episode, in partial remission: Secondary | ICD-10-CM

## 2017-05-21 NOTE — Telephone Encounter (Signed)
Pt looked up in [place] database. Fwd to pcp for signature.Heath Gold, CMA

## 2017-12-06 ENCOUNTER — Other Ambulatory Visit: Payer: Self-pay | Admitting: Family Medicine

## 2017-12-13 ENCOUNTER — Encounter: Payer: Self-pay | Admitting: Family Medicine

## 2017-12-13 ENCOUNTER — Ambulatory Visit (INDEPENDENT_AMBULATORY_CARE_PROVIDER_SITE_OTHER): Payer: BLUE CROSS/BLUE SHIELD | Admitting: Family Medicine

## 2017-12-13 ENCOUNTER — Telehealth: Payer: Self-pay | Admitting: Family Medicine

## 2017-12-13 VITALS — BP 141/87 | HR 70 | Ht 60.63 in | Wt 218.0 lb

## 2017-12-13 DIAGNOSIS — N3000 Acute cystitis without hematuria: Secondary | ICD-10-CM

## 2017-12-13 DIAGNOSIS — H539 Unspecified visual disturbance: Secondary | ICD-10-CM

## 2017-12-13 DIAGNOSIS — R8290 Unspecified abnormal findings in urine: Secondary | ICD-10-CM | POA: Diagnosis not present

## 2017-12-13 DIAGNOSIS — H5711 Ocular pain, right eye: Secondary | ICD-10-CM

## 2017-12-13 LAB — POCT URINALYSIS DIPSTICK
Bilirubin, UA: NEGATIVE
Glucose, UA: NEGATIVE
KETONES UA: NEGATIVE
NITRITE UA: NEGATIVE
PH UA: 5.5 (ref 5.0–8.0)
PROTEIN UA: NEGATIVE
SPEC GRAV UA: 1.02 (ref 1.010–1.025)
UROBILINOGEN UA: 0.2 U/dL

## 2017-12-13 MED ORDER — SULFAMETHOXAZOLE-TRIMETHOPRIM 800-160 MG PO TABS: 1.0000 | ORAL_TABLET | Freq: Two times a day (BID) | ORAL | 0 refills | Status: DC

## 2017-12-13 MED ORDER — LISINOPRIL-HYDROCHLOROTHIAZIDE 20-25 MG PO TABS: 1.0000 | ORAL_TABLET | Freq: Every day | ORAL | 1 refills | Status: DC

## 2017-12-13 NOTE — Patient Instructions (Signed)
Recommend repeat urinalysis in 2 weeks to make sure that the blood has resolved.

## 2017-12-13 NOTE — Assessment & Plan Note (Signed)
Analysis positive for leukocytes and blood.  We will treat with Bactrim.  Recommend repeat urinalysis in 2 weeks to make sure that the blood has resolved.

## 2017-12-13 NOTE — Progress Notes (Signed)
Acute Office Visit  Subjective:    Patient ID: Kristen Spence, female    DOB: 1960/06/13, 57 y.o.   MRN: [id number]  Chief Complaint  Patient presents with  . Headache    pt reports pain/soreness/swelling at R eye. headache was worse last night she took advil this helped w/pain she also tried warm and cold compresses she has an eye exam next wk with [facility]     HPI Patient is in today for right eye pain and headaches that started Satruday. She thought the HA was around her right eye and thought it was a migraine.Roney Jaffe took her Maxalt bc thought it was a migraine. She feels like her eye is sore and today when went to work her co-workers told her her eye looked mildly swollen.  She has tried some warm and cold compresses.  She has an eye appt shcedule for next week.  She says that if looks with single vision in either eye is clear but it is when she tries to look with both eyes that she feels like her blurred vision is actually blurry.  She had a lot of sinus pressure last week but no cold symptoms.  No fevers chills or sweats.  Reports that she noticed a little odor to her urine earlier today.  No dysuria though.  Past Medical History:  Diagnosis Date  . Benign positional vertigo   . Dizziness   . Edema   . Fatigue   . GERD (gastroesophageal reflux disease)    hiatal hernia  . Heart palpitations   . Hiatal hernia   . Hypertension   . Insomnia   . Mental disorder   . Migraine     Past Surgical History:  Procedure Laterality Date  . BREAST BIOPSY    . BREAST EXCISIONAL BIOPSY Left   . BREAST LUMPECTOMY  2000   . [place]  . CHOLECYSTECTOMY  1987  . ENDOMETRIAL ABLATION  09/30/2006  . ESOPHAGOGASTRODUODENOSCOPY     Showed grade D. reflux esophagitis  . TOTAL VAGINAL HYSTERECTOMY  09/27/2008   Dr. Huntley Estelle    Family History  Problem Relation Age of Onset  . Alcohol abuse Father   . Stroke Father   . Hypertension Father   . Ovarian cancer  Mother   . Depression Mother   . Diabetes Mother   . Hyperlipidemia Mother   . Hypertension Mother   . COPD Mother   . Heart attack Sister 62  . Breast cancer Sister     Social History   Socioeconomic History  . Marital status: Married    Spouse name: bobby   . Number of children: 2  . Years of education: 10th grade  . Highest education level: Not on file  Occupational History  . Occupation:      Employer: [employer]  . Financial resource strain: Not on file  . Food insecurity:    Worry: Not on file    Inability: Not on file  . Transportation needs:    Medical: Not on file    Non-medical: Not on file  Tobacco Use  . Smoking status: Never Smoker  . Smokeless tobacco: Never Used  Substance and Sexual Activity  . Alcohol use: No  . Drug use: No  . Sexual activity: Never    Birth control/protection: Surgical  Lifestyle  . Physical activity:    Days per week: Not on file    Minutes per session:  Not on file  . Stress: Not on file  Relationships  . Social connections:    Talks on phone: Not on file    Gets together: Not on file    Attends religious service: Not on file    Active member of club or organization: Not on file    Attends meetings of clubs or organizations: Not on file    Relationship status: Not on file  . Intimate partner violence:    Fear of current or ex partner: Not on file    Emotionally abused: Not on file    Physically abused: Not on file    Forced sexual activity: Not on file  Other Topics Concern  . Not on file  Social History Narrative   No regular exercise    Outpatient Medications Prior to Visit  Medication Sig Dispense Refill  . ALPRAZolam (XANAX) 0.5 MG tablet TAKE 1 TABLET (0.5 MG TOTAL) BY MOUTH 2 (TWO) TIMES DAILY AS NEEDED.  1  . cholecalciferol (VITAMIN D) 1000 units tablet Take 2,000 Units by mouth daily.    . DULoxetine (CYMBALTA) 60 MG capsule Take 1 capsule (60 mg total) by mouth 2 (two) times daily. LAST  REFILL.ADDITIONAL REFILLS REQUIRE AN APPOINTMENT 60 capsule 0  . ipratropium (ATROVENT) 0.03 % nasal spray PLACE 2 SPRAYS INTO BOTH NOSTRILS EVERY 12 (TWELVE) HOURS. 30 mL 2  . montelukast (SINGULAIR) 10 MG tablet TAKE 1 TABLET BY MOUTH EVERYDAY AT BEDTIME 90 tablet 1  . omeprazole (PRILOSEC) 20 MG capsule Take 2 capsules (40 mg total) by mouth daily. 180 capsule 1  . rizatriptan (MAXALT) 10 MG tablet Take 1 tablet (10 mg total) by mouth as needed. May repeat in 2 hours if needed 36 tablet 4  . lisinopril-hydrochlorothiazide (PRINZIDE,ZESTORETIC) 20-25 MG tablet Take 1 tablet by mouth daily.    Marland Kitchen ALPRAZolam (XANAX) 0.5 MG tablet Take 1 tablet (0.5 mg total) by mouth 2 (two) times daily as needed. 45 tablet 1  . doxycycline (VIBRA-TABS) 100 MG tablet Take 1 tablet (100 mg total) by mouth 2 (two) times daily. 20 tablet 0  . HYDROcodone-homatropine (HYCODAN) 5-1.5 MG/5ML syrup Take 5 mLs by mouth every 6 (six) hours as needed for cough. 120 mL 0  . predniSONE (DELTASONE) 20 MG tablet Take 1 tablet (20 mg total) by mouth daily with breakfast. 7 tablet 0   No facility-administered medications prior to visit.     Allergies  Allergen Reactions  . Banana Hives  . Clonazepam Other (See Comments)    Makes her feel weird.     ROS     Objective:    Physical Exam  Constitutional: She is oriented to person, place, and time. She appears well-developed and well-nourished.  HENT:  Head: Normocephalic and atraumatic.  Right Ear: External ear normal.  Left Ear: External ear normal.  Nose: Nose normal.  Mouth/Throat: Oropharynx is clear and moist.  TMs and canals are clear.   Eyes: Pupils are equal, round, and reactive to light. Conjunctivae and EOM are normal. Right eye exhibits no discharge. Left eye exhibits no discharge. No scleral icterus.  Extraocular movements when I was moving upward she said she felt very dizzy when she was looking upward. She does have a little swelling around her left eye.    Neck: Neck supple. No thyromegaly present.  Cardiovascular: Normal rate, regular rhythm and normal heart sounds.  Pulmonary/Chest: Effort normal and breath sounds normal. She has no wheezes.  Lymphadenopathy:    She has no  cervical adenopathy.  Neurological: She is alert and oriented to person, place, and time.  Skin: Skin is warm and dry.  Psychiatric: She has a normal mood and affect.    BP (!) 141/87   Pulse 70   Ht 5' 0.63" (1.54 m)   Wt 218 lb (98.9 kg)   SpO2 97%   BMI 41.70 kg/m  Wt Readings from Last 3 Encounters:  12/13/17 218 lb (98.9 kg)  01/26/17 225 lb (102.1 kg)  10/06/16 225 lb (102.1 kg)    Health Maintenance Due  Topic Date Due  . Hepatitis C Screening  1960-04-20  . HIV Screening  12/15/1976  . COLONOSCOPY  02/20/2015  . MAMMOGRAM  11/03/2017    There are no preventive care reminders to display for this patient.   Lab Results  Component Value Date   TSH 2.85 10/08/2016   Lab Results  Component Value Date   WBC 9.4 10/08/2016   HGB 13.2 10/08/2016   HCT 38.7 10/08/2016   MCV 84.1 10/08/2016   PLT 387 10/08/2016   Lab Results  Component Value Date   NA 141 10/08/2016   K 4.0 10/08/2016   CO2 26 10/08/2016   GLUCOSE 95 10/08/2016   BUN 11 10/08/2016   CREATININE 0.75 10/08/2016   BILITOT 0.5 10/08/2016   ALKPHOS 74 11/01/2015   AST 14 10/08/2016   ALT 17 10/08/2016   PROT 6.9 10/08/2016   ALBUMIN 4.1 11/01/2015   CALCIUM 9.3 10/08/2016   Lab Results  Component Value Date   CHOL 170 10/08/2016   Lab Results  Component Value Date   HDL 47 (L) 10/08/2016   Lab Results  Component Value Date   LDLCALC 106 (H) 10/08/2016   Lab Results  Component Value Date   TRIG 81 10/08/2016   Lab Results  Component Value Date   CHOLHDL 3.6 10/08/2016   No results found for: HGBA1C     Assessment & Plan:   Problem List Items Addressed This Visit      Genitourinary   Acute cystitis without hematuria     Other   Vision changes     Will contact her eye doctor and see if we can get her in sooner.  She does have an appointment scheduled for next week but I am concerned about her waiting that long.  I would prefer that she be evaluated further.  She can have myriad issues going on with that right eye.  I think full dilated eye exam is warranted at this point      Acute right eye pain    No erythema.  No conjunctival injection but she does have just a little bit of edema around the lids but no erythema.  Can go ahead and treat with Bactrim which should cover sinusitis.  There is really no sign of erythema to indicate localized cellulitis.      Abnormal urine odor - Primary    Analysis positive for leukocytes and blood.  We will treat with Bactrim.  Recommend repeat urinalysis in 2 weeks to make sure that the blood has resolved.      Relevant Orders   POCT urinalysis dipstick (Completed)       Meds ordered this encounter  Medications  . sulfamethoxazole-trimethoprim (BACTRIM DS,SEPTRA DS) 800-160 MG tablet    Sig: Take 1 tablet by mouth 2 (two) times daily.    Dispense:  20 tablet    Refill:  0  . lisinopril-hydrochlorothiazide (PRINZIDE,ZESTORETIC) 20-25 MG tablet  Sig: Take 1 tablet by mouth daily.    Dispense:  90 tablet    Refill:  1      Beatrice Lecher, MD

## 2017-12-13 NOTE — Telephone Encounter (Signed)
Called [facility] and Pt is already scheduled for tomorrow. No further questions.

## 2017-12-13 NOTE — Telephone Encounter (Signed)
[facility] and see if we can get her in this week at all for an appointment.  She came in with right eye pain.  She says it was so intense she actually thought initially it was a migraine but then realized that she was actually a little swollen around her eye this morning.  Plus if her vision looks like there is a most of film over it.  Actually is Artie scheduled for next week but I think this just a routine appointment but we need to try to get her in sooner if possible.

## 2017-12-13 NOTE — Assessment & Plan Note (Signed)
Will contact her eye doctor and see if we can get her in sooner.  She does have an appointment scheduled for next week but I am concerned about her waiting that long.  I would prefer that she be evaluated further.  She can have myriad issues going on with that right eye.  I think full dilated eye exam is warranted at this point

## 2017-12-13 NOTE — Assessment & Plan Note (Signed)
No erythema.  No conjunctival injection but she does have just a little bit of edema around the lids but no erythema.  Can go ahead and treat with Bactrim which should cover sinusitis.  There is really no sign of erythema to indicate localized cellulitis.

## 2017-12-14 ENCOUNTER — Telehealth: Payer: Self-pay

## 2017-12-14 ENCOUNTER — Encounter: Payer: Self-pay | Admitting: Family Medicine

## 2017-12-14 NOTE — Telephone Encounter (Signed)
Ok for work note? 

## 2017-12-14 NOTE — Telephone Encounter (Signed)
Letter written, Pt advised.  

## 2017-12-14 NOTE — Telephone Encounter (Signed)
Steward DroneBrenda called and asked for a 2 day work note. Please advise.

## 2017-12-28 ENCOUNTER — Other Ambulatory Visit: Payer: Self-pay | Admitting: Family Medicine

## 2017-12-29 ENCOUNTER — Encounter: Payer: BLUE CROSS/BLUE SHIELD | Admitting: Family Medicine

## 2018-01-20 ENCOUNTER — Telehealth: Payer: Self-pay | Admitting: Family Medicine

## 2018-01-20 NOTE — Telephone Encounter (Signed)
I called pt and left a Voicemail for patient to schedule a F/u on BP and mood

## 2018-03-14 ENCOUNTER — Telehealth: Payer: Self-pay | Admitting: Family Medicine

## 2018-03-14 ENCOUNTER — Ambulatory Visit (INDEPENDENT_AMBULATORY_CARE_PROVIDER_SITE_OTHER): Payer: BLUE CROSS/BLUE SHIELD | Admitting: Family Medicine

## 2018-03-14 ENCOUNTER — Encounter: Payer: Self-pay | Admitting: Family Medicine

## 2018-03-14 VITALS — BP 150/78 | HR 78 | Ht 61.0 in | Wt 225.0 lb

## 2018-03-14 DIAGNOSIS — E559 Vitamin D deficiency, unspecified: Secondary | ICD-10-CM | POA: Diagnosis not present

## 2018-03-14 DIAGNOSIS — I10 Essential (primary) hypertension: Secondary | ICD-10-CM

## 2018-03-14 DIAGNOSIS — K449 Diaphragmatic hernia without obstruction or gangrene: Secondary | ICD-10-CM

## 2018-03-14 DIAGNOSIS — D509 Iron deficiency anemia, unspecified: Secondary | ICD-10-CM

## 2018-03-14 DIAGNOSIS — K219 Gastro-esophageal reflux disease without esophagitis: Secondary | ICD-10-CM

## 2018-03-14 DIAGNOSIS — G43109 Migraine with aura, not intractable, without status migrainosus: Secondary | ICD-10-CM | POA: Diagnosis not present

## 2018-03-14 MED ORDER — IPRATROPIUM BROMIDE 0.03 % NA SOLN: 2.0000 | Freq: Two times a day (BID) | NASAL | 2 refills | Status: DC

## 2018-03-14 MED ORDER — OMEPRAZOLE 20 MG PO CPDR: 20.0000 mg | DELAYED_RELEASE_CAPSULE | Freq: Every day | ORAL | 3 refills | Status: DC

## 2018-03-14 MED ORDER — MONTELUKAST SODIUM 10 MG PO TABS: ORAL_TABLET | ORAL | 3 refills | Status: DC

## 2018-03-14 MED ORDER — AMLODIPINE BESYLATE 5 MG PO TABS: 5.0000 mg | ORAL_TABLET | Freq: Every day | ORAL | 3 refills | Status: DC

## 2018-03-14 MED ORDER — ALPRAZOLAM 0.5 MG PO TABS: ORAL_TABLET | ORAL | 1 refills | Status: DC

## 2018-03-14 MED ORDER — RIZATRIPTAN BENZOATE 10 MG PO TABS: 10.0000 mg | ORAL_TABLET | ORAL | 4 refills | Status: DC | PRN

## 2018-03-14 NOTE — Patient Instructions (Signed)
Follow up in 2 weeks for a nurse visit to recheck BP

## 2018-03-14 NOTE — Progress Notes (Signed)
Subjective:    CC: Depression  HPI:  F/U depression -overall she is still struggling.  There is a lot of stress at work.  They are planning for layoffs again sometime this spring.  Though she says she is not a stress that she has been about it in the past.  She also misses her daughter who has not been speaking to her engaging with her for the last 2 years and she still does not understand exactly why.  They did not have any type of big fallout.  Hypertension- Pt denies chest pain, SOB, dizziness, or heart palpitations.  Taking meds as directed w/o problems.  Denies medication side effects.    Migraine headaches-overall she is doing okay.  She uses the Maxalt as needed and says it does seem to work well.  GERD -She is needing a refill on her omeprazole.  She often takes 20 to 40 mg daily as needed.  Past medical history, Surgical history, Family history not pertinant except as noted below, Social history, Allergies, and medications have been entered into the medical record, reviewed, and corrections made.   Review of Systems: No fevers, chills, night sweats, weight loss, chest pain, or shortness of breath.   Objective:    General: Well Developed, well nourished, and in no acute distress.  Neuro: Alert and oriented x3, extra-ocular muscles intact, sensation grossly intact.  HEENT: Normocephalic, atraumatic  Skin: Warm and dry, no rashes. Cardiac: Regular rate and rhythm, no murmurs rubs or gallops, no lower extremity edema.  Respiratory: Clear to auscultation bilaterally. Not using accessory muscles, speaking in full sentences.   Impression and Recommendations:    HTN -uncontrolled.  We will add amlodipine to her lisinopril HCTZ.  Follow-up in 2 weeks for nurse blood pressure check.  Migraine HA -.  Continue current regimen.  Just on Maxalt as needed.  No prophylaxis currently.  GERD - due for refills.  Acacian sent.  Depression - will continue Cymbalta.  Did refill xanax and  reminded her to use sparinging.  To refer her to therapy or counseling but she declined and says she is okay with where she is at for now.  Really she will not lose her job with job cuts this spring.

## 2018-03-14 NOTE — Telephone Encounter (Signed)
Left VM for Pt to return clinic call.  

## 2018-03-14 NOTE — Telephone Encounter (Signed)
Call pt: since bp was still high I am going to add amlodipine to her BP regimen. Take at night and take the lisinopril hct in the AM.

## 2018-03-15 NOTE — Telephone Encounter (Signed)
Recommendations given to patient.Kristen Spence, Viann Shove, CMA

## 2018-03-15 NOTE — Telephone Encounter (Signed)
Kristen Spence return call to clinic. I called and there was no answer, phone only rang, no voicemail.

## 2018-04-11 DIAGNOSIS — J018 Other acute sinusitis: Secondary | ICD-10-CM | POA: Diagnosis not present

## 2018-04-11 DIAGNOSIS — R05 Cough: Secondary | ICD-10-CM | POA: Diagnosis not present

## 2018-04-11 DIAGNOSIS — R030 Elevated blood-pressure reading, without diagnosis of hypertension: Secondary | ICD-10-CM | POA: Diagnosis not present

## 2018-04-17 DIAGNOSIS — R197 Diarrhea, unspecified: Secondary | ICD-10-CM | POA: Diagnosis not present

## 2018-04-17 DIAGNOSIS — R110 Nausea: Secondary | ICD-10-CM | POA: Diagnosis not present

## 2018-04-17 DIAGNOSIS — J019 Acute sinusitis, unspecified: Secondary | ICD-10-CM | POA: Diagnosis not present

## 2018-04-19 ENCOUNTER — Encounter: Payer: Self-pay | Admitting: Family Medicine

## 2018-04-19 ENCOUNTER — Ambulatory Visit (INDEPENDENT_AMBULATORY_CARE_PROVIDER_SITE_OTHER): Payer: BLUE CROSS/BLUE SHIELD | Admitting: Family Medicine

## 2018-04-19 ENCOUNTER — Ambulatory Visit (INDEPENDENT_AMBULATORY_CARE_PROVIDER_SITE_OTHER): Payer: BLUE CROSS/BLUE SHIELD

## 2018-04-19 ENCOUNTER — Other Ambulatory Visit: Payer: Self-pay

## 2018-04-19 VITALS — BP 124/72 | HR 89 | Temp 99.3°F | Ht 61.0 in | Wt 222.0 lb

## 2018-04-19 DIAGNOSIS — R197 Diarrhea, unspecified: Secondary | ICD-10-CM | POA: Diagnosis not present

## 2018-04-19 DIAGNOSIS — R112 Nausea with vomiting, unspecified: Secondary | ICD-10-CM

## 2018-04-19 DIAGNOSIS — J019 Acute sinusitis, unspecified: Secondary | ICD-10-CM | POA: Diagnosis not present

## 2018-04-19 MED ORDER — PROMETHAZINE HCL 25 MG PO TABS: 25.0000 mg | ORAL_TABLET | ORAL | 0 refills | Status: DC | PRN

## 2018-04-19 MED ORDER — PROMETHAZINE HCL 25 MG/ML IJ SOLN
25.0000 mg | Freq: Once | INTRAMUSCULAR | Status: AC
  Administered 2018-04-19: 25 mg via INTRAMUSCULAR

## 2018-04-19 NOTE — Progress Notes (Signed)
Acute Office Visit  Subjective:    Patient ID: Kristen Spence, female    DOB: April 20, 1960, 58 y.o.   MRN: [id number]  Chief Complaint  Patient presents with  . Follow-up    pt seen at [facility] for sinusitus,N/V/D    HPI Patient is in today for nausea, vomiting and diarrhea.  She says starting last Monday approximately 9 days ago she had started experiencing some nasal symptoms.  She had been experiencing nasal blockage, postnasal drip with facial pressure and pain as well as significant sinus congestion and sore throat.  He said she went to [facility] that same day that she started experiencing symptoms and was given a penicillin shot as well as oral penicillin.  By Wednesday approximately 2 days later she started having nausea, vomiting and diarrhea.  She describes the diarrhea as watery.  She says every time she tries to eat or drink she vomits. She says that she was actually seen at [facility] urgent care about 2 days ago.   She was given some Zofran at the urgent care.  She says the Zofran is actually not helping at all.  She is not having any localized abdominal pain.  And what abdominal pain she is experiencing she says is mild and only when she is getting ready to have diarrhea.  She denies any blood in the stool.  He has been running some low-grade fevers.  With the highest being right at 100.  Denies any cough or shortness of breath.  Past Medical History:  Diagnosis Date  . Benign positional vertigo   . Dizziness   . Edema   . Fatigue   . GERD (gastroesophageal reflux disease)    hiatal hernia  . Heart palpitations   . Hiatal hernia   . Hypertension   . Insomnia   . Mental disorder   . Migraine     Past Surgical History:  Procedure Laterality Date  . BREAST BIOPSY    . BREAST EXCISIONAL BIOPSY Left   . BREAST LUMPECTOMY  2000   . CESAREAN SECTION  1982  . CHOLECYSTECTOMY  1987  . ENDOMETRIAL ABLATION  09/30/2006  . ESOPHAGOGASTRODUODENOSCOPY     Showed  grade D. reflux esophagitis  . TOTAL VAGINAL HYSTERECTOMY  09/27/2008   Dr. Lavon Paganini    Family History  Problem Relation Age of Onset  . Alcohol abuse Father   . Stroke Father   . Hypertension Father   . Ovarian cancer Mother   . Depression Mother   . Diabetes Mother   . Hyperlipidemia Mother   . Hypertension Mother   . COPD Mother   . Heart attack Sister 66  . Breast cancer Sister     Social History   Socioeconomic History  . Marital status: Married    Spouse name: bobby   . Number of children: 2  . Years of education: 10th grade  . Highest education level: Not on file  Occupational History  . Occupation:      Employer: [employer]  Social Needs  . Financial resource strain: Not on file  . Food insecurity:    Worry: Not on file    Inability: Not on file  . Transportation needs:    Medical: Not on file    Non-medical: Not on file  Tobacco Use  . Smoking status: Never Smoker  . Smokeless tobacco: Never Used  Substance and Sexual Activity  . Alcohol use: No  . Drug use: No  .  Sexual activity: Never    Birth control/protection: Surgical  Lifestyle  . Physical activity:    Days per week: Not on file    Minutes per session: Not on file  . Stress: Not on file  Relationships  . Social connections:    Talks on phone: Not on file    Gets together: Not on file    Attends religious service: Not on file    Active member of club or organization: Not on file    Attends meetings of clubs or organizations: Not on file    Relationship status: Not on file  . Intimate partner violence:    Fear of current or ex partner: Not on file    Emotionally abused: Not on file    Physically abused: Not on file    Forced sexual activity: Not on file  Other Topics Concern  . Not on file  Social History Narrative   No regular exercise    Outpatient Medications Prior to Visit  Medication Sig Dispense Refill  . ALPRAZolam (XANAX) 0.5 MG tablet TAKE 1 TABLET (0.5 MG  TOTAL) BY MOUTH 2 (TWO) TIMES DAILY AS NEEDED. 45 tablet 1  . amLODipine (NORVASC) 5 MG tablet Take 1 tablet (5 mg total) by mouth daily. 30 tablet 3  . cholecalciferol (VITAMIN D) 1000 units tablet Take 2,000 Units by mouth daily.    . DULoxetine (CYMBALTA) 60 MG capsule Take 1 capsule (60 mg total) by mouth 2 (two) times daily. 180 capsule 1  . ipratropium (ATROVENT) 0.03 % nasal spray Place 2 sprays into both nostrils every 12 (twelve) hours. 30 mL 2  . lisinopril-hydrochlorothiazide (PRINZIDE,ZESTORETIC) 20-25 MG tablet Take 1 tablet by mouth daily. 90 tablet 1  . montelukast (SINGULAIR) 10 MG tablet TAKE 1 TABLET BY MOUTH EVERYDAY AT BEDTIME 90 tablet 3  . omeprazole (PRILOSEC) 20 MG capsule Take 1-2 capsules (20-40 mg total) by mouth daily. 180 capsule 3  . rizatriptan (MAXALT) 10 MG tablet Take 1 tablet (10 mg total) by mouth as needed. May repeat in 2 hours if needed 36 tablet 4  . doxycycline (VIBRAMYCIN) 100 MG capsule Take 1 capsule by mouth 2 (two) times daily.    . ondansetron (ZOFRAN-ODT) 4 MG disintegrating tablet      No facility-administered medications prior to visit.     Allergies  Allergen Reactions  . Banana Hives  . Clonazepam Other (See Comments)    Makes her feel weird.     ROS     Objective:    Physical Exam  Constitutional: She is oriented to person, place, and time. She appears well-developed and well-nourished.  HENT:  Head: Normocephalic and atraumatic.  Right Ear: External ear normal.  Left Ear: External ear normal.  Nose: Nose normal.  Mouth/Throat: Oropharynx is clear and moist.  TMs and canals are clear.   Eyes: Pupils are equal, round, and reactive to light. Conjunctivae and EOM are normal.  Neck: Neck supple. No thyromegaly present.  Cardiovascular: Normal rate, regular rhythm and normal heart sounds.  Pulmonary/Chest: Effort normal and breath sounds normal. She has no wheezes.  Abdominal: Soft. Bowel sounds are normal. She exhibits no  distension and no mass. There is abdominal tenderness. There is no rebound and no guarding.  Generalized tenderness  Lymphadenopathy:    She has no cervical adenopathy.  Neurological: She is alert and oriented to person, place, and time.  Skin: Skin is warm and dry.  Psychiatric: She has a normal mood and affect.  BP 124/72   Pulse 89   Temp 99.3 F (37.4 C)   Ht 5\' 1"  (1.549 m)   Wt 222 lb (100.7 kg)   SpO2 95%   BMI 41.95 kg/m  Wt Readings from Last 3 Encounters:  04/19/18 222 lb (100.7 kg)  03/14/18 225 lb (102.1 kg)  12/13/17 218 lb (98.9 kg)    Health Maintenance Due  Topic Date Due  . Hepatitis C Screening  12-12-1960  . HIV Screening  12/15/1976  . COLONOSCOPY  02/20/2015  . MAMMOGRAM  11/03/2017    There are no preventive care reminders to display for this patient.   Lab Results  Component Value Date   TSH 2.85 10/08/2016   Lab Results  Component Value Date   WBC 9.4 10/08/2016   HGB 13.2 10/08/2016   HCT 38.7 10/08/2016   MCV 84.1 10/08/2016   PLT 387 10/08/2016   Lab Results  Component Value Date   NA 141 10/08/2016   K 4.0 10/08/2016   CO2 26 10/08/2016   GLUCOSE 95 10/08/2016   BUN 11 10/08/2016   CREATININE 0.75 10/08/2016   BILITOT 0.5 10/08/2016   ALKPHOS 74 11/01/2015   AST 14 10/08/2016   ALT 17 10/08/2016   PROT 6.9 10/08/2016   ALBUMIN 4.1 11/01/2015   CALCIUM 9.3 10/08/2016   Lab Results  Component Value Date   CHOL 170 10/08/2016   Lab Results  Component Value Date   HDL 47 (L) 10/08/2016   Lab Results  Component Value Date   LDLCALC 106 (H) 10/08/2016   Lab Results  Component Value Date   TRIG 81 10/08/2016   Lab Results  Component Value Date   CHOLHDL 3.6 10/08/2016   No results found for: HGBA1C     Assessment & Plan:   Problem List Items Addressed This Visit    None    Visit Diagnoses    Intractable vomiting with nausea, unspecified vomiting type    -  Primary   Relevant Medications   promethazine  (PHENERGAN) injection 25 mg (Completed)   Other Relevant Orders   COMPLETE METABOLIC PANEL WITH GFR   CBC with Differential/Platelet   TSH   Lipase   C-reactive protein   DG Abd 1 View   Diarrhea, unspecified type       Relevant Orders   COMPLETE METABOLIC PANEL WITH GFR   CBC with Differential/Platelet   TSH   Lipase   C-reactive protein   DG Abd 1 View   Acute non-recurrent sinusitis, unspecified location       Relevant Medications   doxycycline (VIBRAMYCIN) 100 MG capsule   promethazine (PHENERGAN) injection 25 mg (Completed)     Intractable vomiting with nausea and diarrhea-unclear etiology.  No localized abdominal pain which is reassuring.  I did go ahead and get labs today just evaluate for elevated white blood cell count she could have colitis.  Also check for pancreatitis as well as elevated liver enzymes.  She is had a cholecystectomy so it should not be acute cholecystitis.  We did give her an IM shot of Phenergan here in the office and she was able to keep some ice chips down for at least an hour before leaving the office.  I did get a KUB as well as labs.  If we can send over some Phenergan for her to pick up and try to have her take that about 4 hours before the IM dose wears off and try to get some fluid  in her.  I explained that she can hold off on eating or just really want her to push fluids and hydration if she is not better in the next 24 hours then we may need to send her to the emergency department or have her come in to our office for IV fluids.  Meds ordered this encounter  Medications  . promethazine (PHENERGAN) injection 25 mg    Acute sinusitis-we will hold off on starting any new antibiotics at this point.  I really do not think this is a side effect of the penicillin or I feel like she would should be feeling better by now.  And she is not.  Nani Gasser, MD

## 2018-04-20 LAB — COMPLETE METABOLIC PANEL WITH GFR
AG Ratio: 1.2 (calc) (ref 1.0–2.5)
ALT: 22 U/L (ref 6–29)
AST: 25 U/L (ref 10–35)
Albumin: 3.9 g/dL (ref 3.6–5.1)
Alkaline phosphatase (APISO): 70 U/L (ref 37–153)
BUN/Creatinine Ratio: 17 (calc) (ref 6–22)
BUN: 18 mg/dL (ref 7–25)
CALCIUM: 8.8 mg/dL (ref 8.6–10.4)
CO2: 27 mmol/L (ref 20–32)
Chloride: 103 mmol/L (ref 98–110)
Creat: 1.08 mg/dL — ABNORMAL HIGH (ref 0.50–1.05)
GFR, EST NON AFRICAN AMERICAN: 57 mL/min/{1.73_m2} — AB (ref >=60)
GFR, Est African American: 66 mL/min/{1.73_m2} (ref >=60)
Globulin: 3.2 g/dL (calc) (ref 1.9–3.7)
Glucose, Bld: 92 mg/dL (ref 65–99)
Potassium: 3.8 mmol/L (ref 3.5–5.3)
Sodium: 140 mmol/L (ref 135–146)
Total Bilirubin: 0.5 mg/dL (ref 0.2–1.2)
Total Protein: 7.1 g/dL (ref 6.1–8.1)

## 2018-04-20 LAB — CBC WITH DIFFERENTIAL/PLATELET
Absolute Monocytes: 300 cells/uL (ref 200–950)
BASOS ABS: 10 {cells}/uL (ref 0–200)
Basophils Relative: 0.2 %
EOS ABS: 0 {cells}/uL — AB (ref 15–500)
Eosinophils Relative: 0 %
HEMATOCRIT: 40.3 % (ref 35.0–45.0)
Hemoglobin: 13.9 g/dL (ref 11.7–15.5)
Lymphs Abs: 1020 cells/uL (ref 850–3900)
MCH: 29.3 pg (ref 27.0–33.0)
MCHC: 34.5 g/dL (ref 32.0–36.0)
MCV: 84.8 fL (ref 80.0–100.0)
MPV: 10.1 fL (ref 7.5–12.5)
Monocytes Relative: 6 %
Neutro Abs: 3670 cells/uL (ref 1500–7800)
Neutrophils Relative %: 73.4 %
Platelets: 233 10*3/uL (ref 140–400)
RBC: 4.75 10*6/uL (ref 3.80–5.10)
RDW: 13.5 % (ref 11.0–15.0)
Total Lymphocyte: 20.4 %
WBC: 5 10*3/uL (ref 3.8–10.8)

## 2018-04-20 LAB — TSH: TSH: 2.05 mIU/L (ref 0.40–4.50)

## 2018-04-20 LAB — C-REACTIVE PROTEIN: CRP: 26.6 mg/L — ABNORMAL HIGH (ref <8.0)

## 2018-04-20 LAB — LIPASE: Lipase: 25 U/L (ref 7–60)

## 2018-04-21 ENCOUNTER — Telehealth: Payer: Self-pay | Admitting: Family Medicine

## 2018-04-21 DIAGNOSIS — I10 Essential (primary) hypertension: Secondary | ICD-10-CM | POA: Diagnosis not present

## 2018-04-21 DIAGNOSIS — E876 Hypokalemia: Secondary | ICD-10-CM | POA: Diagnosis not present

## 2018-04-21 DIAGNOSIS — R112 Nausea with vomiting, unspecified: Secondary | ICD-10-CM | POA: Diagnosis not present

## 2018-04-21 DIAGNOSIS — Z888 Allergy status to other drugs, medicaments and biological substances status: Secondary | ICD-10-CM | POA: Diagnosis not present

## 2018-04-21 DIAGNOSIS — R109 Unspecified abdominal pain: Secondary | ICD-10-CM | POA: Diagnosis not present

## 2018-04-21 DIAGNOSIS — Z91018 Allergy to other foods: Secondary | ICD-10-CM | POA: Diagnosis not present

## 2018-04-21 DIAGNOSIS — R1110 Vomiting, unspecified: Secondary | ICD-10-CM | POA: Diagnosis not present

## 2018-04-21 DIAGNOSIS — R197 Diarrhea, unspecified: Secondary | ICD-10-CM | POA: Diagnosis not present

## 2018-04-21 NOTE — Telephone Encounter (Signed)
Call pt and check on her. See if still vomiting, etc.  See if still hfever.

## 2018-04-21 NOTE — Telephone Encounter (Signed)
Called and spoke with patient she is no longer vomiting but still extremely nauseated and having watery diarrhea she is not really improving she just feels extremely weak even just walking across the kitchen.  I encouraged her to go to the emergency department ASAP.  At this point I think she needs IV fluids.  There was nothing very really revealing on her labs.  Hopefully they can do a stool culture and check for C. difficile.

## 2018-04-25 ENCOUNTER — Telehealth: Payer: Self-pay | Admitting: *Deleted

## 2018-04-25 NOTE — Telephone Encounter (Signed)
Kristen Spence called and states she needs Dr Linford Arnold to call her. She states her husband is in the hospital and believes he has COVID-19. She states Dr Linford Arnold called her the other day and she wants her to call back.

## 2018-04-25 NOTE — Telephone Encounter (Signed)
Her husband has Pneumonia and is in the ICU at [facility]. They are testing him for COVID-19.  Now she has to quarantine for 2 months.

## 2018-04-25 NOTE — Telephone Encounter (Signed)
Form completed,faxed,confirmation received and scanned into patient's chart..Maryah Marinaro Lynetta, CMA  

## 2018-04-25 NOTE — Telephone Encounter (Signed)
Called and spoke with patient. She is doing some better. Diarrhea has slowed down. Still very weak and nauseated. She will call if develops and cough or fever.  She will need to be quarantined likely. Her husband is awaiting his test results. Will need to extend her work note til 05/08/2018.

## 2018-04-26 ENCOUNTER — Telehealth: Payer: Self-pay | Admitting: *Deleted

## 2018-04-26 NOTE — Telephone Encounter (Signed)
Re-faxed pt's fmla forms w/correction to date on Pg 5 question #5 to reflect that she will need to be out of work until 05/08/2018. Confirmation received .Marland KitchenHeath Gold, CMA

## 2018-04-28 ENCOUNTER — Telehealth: Payer: Self-pay | Admitting: *Deleted

## 2018-04-28 NOTE — Telephone Encounter (Signed)
Form completed,faxed,confirmation received and scanned into patient's chart..Jaylianna Tatlock Lynetta, CMA  

## 2018-05-02 ENCOUNTER — Telehealth: Payer: Self-pay

## 2018-05-02 ENCOUNTER — Telehealth: Payer: Self-pay | Admitting: *Deleted

## 2018-05-02 NOTE — Telephone Encounter (Signed)
Called [employer] to spoke w/Breanna a representative about pt's fmla paperwork. She informed me that they will need her OV notes,labs. To be faxed to the same # as before and that I didn't need to send the previous forms since they already have those.Laureen Ochs, Viann Shove, CMA

## 2018-05-02 NOTE — Telephone Encounter (Signed)
Taris called with the results of her husbands COVID-19. She would not give me the results. She only wants a return call from Dr Linford Arnold.

## 2018-05-02 NOTE — Telephone Encounter (Signed)
Called and spoke with parent Lameeka Dungee her husband has been diagnosed with coded and he is currently hospitalized in the ICU at [facility].  He is on a ventilator currently and not doing well.  She suspects that she probably has had cover the whole time.  There is a certain portion of patients who present with diarrhea and fever and she is continued to have diarrhea and vomiting for almost 3 weeks now and still is extremely weak.  She is just now beginning to be able to keep a little bit of food down.  But then says she still just has diarrhea every time she eats.  We will get her paperwork updated for FMLA.  Go to go ahead and write her out till next Monday because at this point she is also having self quarantine anyway.  I suspect that she herself probably has had coded this entire time.

## 2018-05-02 NOTE — Telephone Encounter (Signed)
Pt's OV, labs, ect., faxed and confirmation received .Marland KitchenHeath Gold, CMA

## 2018-05-06 ENCOUNTER — Telehealth: Payer: Self-pay | Admitting: Family Medicine

## 2018-05-06 NOTE — Telephone Encounter (Signed)
Pt called and Left a message stating her and Dr.Metheney has been talking back and forth and she would like for Dr.metheney to call her at [phone]

## 2018-05-07 ENCOUNTER — Other Ambulatory Visit: Payer: Self-pay | Admitting: Family Medicine

## 2018-05-09 NOTE — Telephone Encounter (Signed)
Tonya,  Please call her and see what is goin on. Probably about her paperwork.

## 2018-05-09 NOTE — Telephone Encounter (Signed)
Pt stated that her FMLA was denied again due to not being enough documentation. She was told that a clinician reviewed the records and did not see anything in her records that warranted her to be out of work.  She called HR Thursday and spoke w/someone there about what was going on. She advised them that her husband was dx w/COVID-19, and told the person she spoke w/that she was denied her disability. She was advised by the person she was speaking with in HR to stay out until 05/16/2018.    She said that they may contact our office regarding this. Will write letter for extended time out of work for her and mail copy.  Pt wanted to make [place] aware of what was going on just in case we were contacted regarding this.   She also wanted [place] to know that her husband is still in ICU and is now on O2. Laureen Ochs, Viann Shove, CMA

## 2018-05-17 ENCOUNTER — Encounter: Payer: Self-pay | Admitting: Family Medicine

## 2018-05-17 ENCOUNTER — Ambulatory Visit (INDEPENDENT_AMBULATORY_CARE_PROVIDER_SITE_OTHER): Payer: BLUE CROSS/BLUE SHIELD | Admitting: Family Medicine

## 2018-05-17 VITALS — BP 160/99 | HR 102 | Temp 98.6°F | Wt 216.0 lb

## 2018-05-17 DIAGNOSIS — R197 Diarrhea, unspecified: Secondary | ICD-10-CM | POA: Diagnosis not present

## 2018-05-17 DIAGNOSIS — R6889 Other general symptoms and signs: Secondary | ICD-10-CM | POA: Diagnosis not present

## 2018-05-17 DIAGNOSIS — E860 Dehydration: Secondary | ICD-10-CM | POA: Diagnosis not present

## 2018-05-17 DIAGNOSIS — Z20822 Contact with and (suspected) exposure to covid-19: Secondary | ICD-10-CM

## 2018-05-17 DIAGNOSIS — R5383 Other fatigue: Secondary | ICD-10-CM

## 2018-05-17 NOTE — Progress Notes (Signed)
Virtual Visit via Video Note  I connected with Kristen Spence on 05/17/18 at  8:30 AM EDT by a video enabled telemedicine application and verified that I am speaking with the correct person using two identifiers.   I discussed the limitations of evaluation and management by telemedicine and the availability of in person appointments. The patient expressed understanding and agreed to proceed.  Pt was at home and I was in my office for the virtual visit.     Subjective:    CC: F/u diarrhea  HPI:  58 yo female C/O of still not feeling wellafter several weeks of diarrhea and vomiting.   She feels dehydrated.  She is still having diarrhea but not longer having vomiting. If she eats more than a little she will get some nauseated. Son picked up the probiotic. Still using some kayopectac. Using some pedialyte ans that seem to help. BM not as frequent.    Husband is now home. Was in the ICU and is now home with COVID. He is on 3 liters.   She was denied disability. She has been out of work for several weeks. She had to go to the ED for IV fluids.      Past medical history, Surgical history, Family history not pertinant except as noted below, Social history, Allergies, and medications have been entered into the medical record, reviewed, and corrections made.   Review of Systems: No fevers, chills, night sweats, weight loss, chest pain, or shortness of breath.   Objective:    General: Speaking clearly in complete sentences without any shortness of breath.  Alert and oriented x3.  Normal judgment. No apparent acute distress.Well groomed.     Impression and Recommendations:    Diarrhea/COVID suspect - I do think she had COVID.  continue the probiotics. She is still really fatigued.  Try to eat rice and chicken and noodles.  She is eating toast.    Fatigue -  She is gradually getting better. Encourage her to starting doing some light walking in the house to work on improving her muscle mass.    Mild dehydration - continue with the Pedialyte.   Acute stress - she is happy her husband is home but says it has been really emotional.        I discussed the assessment and treatment plan with the patient. The patient was provided an opportunity to ask questions and all were answered. The patient agreed with the plan and demonstrated an understanding of the instructions.   The patient was advised to call back or seek an in-person evaluation if the symptoms worsen or if the condition fails to improve as anticipated.   Nani Gasser, MD

## 2018-05-17 NOTE — Progress Notes (Signed)
She is not able to eat very much,she is still eating bland foods in very small portions and drinking Pedialyte. She reports that she is just very weak. She still has some diarrhea that's watery.  FYI her husband is home he's on O2.   Laureen Ochs, Viann Shove, CMA

## 2018-06-06 ENCOUNTER — Other Ambulatory Visit: Payer: Self-pay | Admitting: Family Medicine

## 2018-07-23 DIAGNOSIS — M25562 Pain in left knee: Secondary | ICD-10-CM | POA: Diagnosis not present

## 2018-07-23 DIAGNOSIS — Z79899 Other long term (current) drug therapy: Secondary | ICD-10-CM | POA: Diagnosis not present

## 2018-07-23 DIAGNOSIS — Z1159 Encounter for screening for other viral diseases: Secondary | ICD-10-CM | POA: Diagnosis not present

## 2018-07-23 DIAGNOSIS — M79605 Pain in left leg: Secondary | ICD-10-CM | POA: Diagnosis not present

## 2018-07-27 ENCOUNTER — Telehealth: Payer: Self-pay

## 2018-07-27 NOTE — Telephone Encounter (Signed)
Pt scheduled with Dr Georgina Snell

## 2018-07-27 NOTE — Telephone Encounter (Signed)
Kristen Spence called and left a message stating she has fluid on her knee. Please call patient and schedule with sports medicine.

## 2018-07-28 ENCOUNTER — Other Ambulatory Visit: Payer: Self-pay

## 2018-07-28 ENCOUNTER — Ambulatory Visit: Payer: BC Managed Care – PPO | Admitting: Family Medicine

## 2018-07-28 ENCOUNTER — Encounter: Payer: Self-pay | Admitting: Family Medicine

## 2018-07-28 VITALS — BP 155/85 | HR 71 | Temp 97.7°F | Ht 62.0 in | Wt 221.0 lb

## 2018-07-28 DIAGNOSIS — M25562 Pain in left knee: Secondary | ICD-10-CM | POA: Diagnosis not present

## 2018-07-28 DIAGNOSIS — M25462 Effusion, left knee: Secondary | ICD-10-CM | POA: Diagnosis not present

## 2018-07-28 DIAGNOSIS — Z112 Encounter for screening for other bacterial diseases: Secondary | ICD-10-CM | POA: Diagnosis not present

## 2018-07-28 MED ORDER — DICLOFENAC SODIUM 1 % TD GEL: 4.0000 g | Freq: Four times a day (QID) | TRANSDERMAL | 11 refills | Status: DC

## 2018-07-28 NOTE — Progress Notes (Signed)
Kristen Spence is a 58 y.o. female who presents to [facility] today for a 6-day Hx of left knee pain exacerbated by activity. Patient woke the morning of 07/22/2018 with knee pain and swelling down to ankle. Pain improves with rest and is worse with activity. Pain is worse at the end of the day. Visited [facility] ED on 07/22/18. They took x-rays and told her she had OA. They performed a Toradol injection IM which did not provide relief. Patient is concerned about an infection due to an insect bite on her left foot that occurred 2-3 weeks ago with accompanying redness and swelling.  She denies any locking, catching or giving way. She denies any injury. She denies any history of gout.  She also has tried some Aleve which helps a little.  She denies fevers chills nausea vomiting or diarrhea.  ROS:  As above  Exam:  BP (!) 155/85   Pulse 71   Temp 97.7 F (36.5 C) (Oral)   Ht 5\' 2"  (1.575 m)   Wt 221 lb (100.2 kg)   SpO2 95%   BMI 40.42 kg/m  Wt Readings from Last 5 Encounters:  07/28/18 221 lb (100.2 kg)  05/17/18 216 lb (98 kg)  04/19/18 222 lb (100.7 kg)  03/14/18 225 lb (102.1 kg)  12/13/17 218 lb (98.9 kg)   General: Well Developed, well nourished, morbidly obese, and in no acute distress.  Neuro/Psych: Alert and oriented x3, extra-ocular muscles intact, able to move all 4 extremities, sensation grossly intact. Skin: Warm and dry, no rashes noted.  Respiratory: Not using accessory muscles, speaking in full sentences, trachea midline.  Cardiovascular: Pulses palpable, no extremity edema. Abdomen: Does not appear distended. MSK: Left knee moderate effusion present with no deformity.  No erythema.  Pain to palpation on medial joint line. Full ROM mild retropatellar crepitation.   Normal strength with knee flexion and extension.  Negative anterior and posterior drawer tests. Guarding with McMurray's test.  Contralateral  right knee normal-appearing normal motion with crepitation.  Nontender normal strength the ligaments exam.  Normal gait.    Lab and Radiology Results Limited musculoskeletal ultrasound of left knee reveals a large effusion with normal bony structures.  Normal tendinous structures.  Aspiration and injection of left knee effusion. Consent obtained and timeout performed. Lateral suprapatellar space identified and cleaned with rubbing alcohol. Cold spray applied and 3 mL of lidocaine injected subcutaneously and along planned aspiration tract to the knee effusion. The skin was then again sterilized with chlorhexidine. 18-gauge needle used to access the joint capsule and effusion and 40 mL of slightly cloudy yellowish fluid was aspirated. Syringe was exchanged and 4 mL of Marcaine and 80 mg of Depo-Medrol injected. Patient tolerated the procedure well.  She had partial pain relief after injection.     Assessment and Plan: 58 y.o. female with morbidly obese body habitus is presenting for 6-day hx of left knee pain and swelling. Patient visited [facility] ED 6 days ago and was told she likely had OA. Performed knee aspiration and steroid injection. Aspirate was mildly cloudy, nonpurulent. Sent for lab analysis crystal analysis cell count differential and culture to differentiate between OA, Gout, and infection. Requesting [facility] from [facility]. Prescribed Diclofenac gel for pain. Recheck if not improving.   PDMP not reviewed this encounter. Orders Placed This Encounter  Procedures  . Anaerobic and Aerobic Culture  . Synovial cell count + diff, w/ crystals   Meds  ordered this encounter  Medications  . diclofenac sodium (VOLTAREN) 1 % GEL    Sig: Apply 4 g topically 4 (four) times daily. To affected joint.    Dispense:  100 g    Refill:  11    Knee OA. Tried and failed aleve and ibuprofen    Historical information moved to improve visibility of documentation.  Past Medical  History:  Diagnosis Date  . Benign positional vertigo   . Dizziness   . Edema   . Fatigue   . GERD (gastroesophageal reflux disease)    hiatal hernia  . Heart palpitations   . Hiatal hernia   . Hypertension   . Insomnia   . Mental disorder   . Migraine    Past Surgical History:  Procedure Laterality Date  . BREAST BIOPSY    . BREAST EXCISIONAL BIOPSY Left   . BREAST LUMPECTOMY  2000   . [place]  . CHOLECYSTECTOMY  1987  . ENDOMETRIAL ABLATION  09/30/2006  . ESOPHAGOGASTRODUODENOSCOPY     Showed grade D. reflux esophagitis  . TOTAL VAGINAL HYSTERECTOMY  09/27/2008   Dr. Huntley Estelle   Social History   Tobacco Use  . Smoking status: Never Smoker  . Smokeless tobacco: Never Used  Substance Use Topics  . Alcohol use: No   family history includes Alcohol abuse in her father; Breast cancer in her sister; COPD in her mother; Depression in her mother; Diabetes in her mother; Heart attack (age of onset: 28) in her sister; Hyperlipidemia in her mother; Hypertension in her father and mother; Ovarian cancer in her mother; Stroke in her father.  Medications: Current Outpatient Medications  Medication Sig Dispense Refill  . ALPRAZolam (XANAX) 0.5 MG tablet TAKE 1 TABLET (0.5 MG TOTAL) BY MOUTH 2 (TWO) TIMES DAILY AS NEEDED. 45 tablet 1  . amLODipine (NORVASC) 5 MG tablet TAKE 1 TABLET BY MOUTH EVERY DAY 90 tablet 0  . cholecalciferol (VITAMIN D) 1000 units tablet Take 2,000 Units by mouth daily.    . DULoxetine (CYMBALTA) 60 MG capsule Take 1 capsule (60 mg total) by mouth 2 (two) times daily. 180 capsule 1  . ipratropium (ATROVENT) 0.03 % nasal spray Place 2 sprays into both nostrils every 12 (twelve) hours. 30 mL 2  . lisinopril-hydrochlorothiazide (ZESTORETIC) 20-25 MG tablet TAKE 1 TABLET BY MOUTH EVERY DAY 90 tablet 1  . montelukast (SINGULAIR) 10 MG tablet TAKE 1 TABLET BY MOUTH EVERYDAY AT BEDTIME 90 tablet 3  . omeprazole (PRILOSEC) 20 MG capsule Take 1-2  capsules (20-40 mg total) by mouth daily. 180 capsule 3  . promethazine (PHENERGAN) 25 MG tablet Take 1 tablet (25 mg total) by mouth every 4 (four) hours as needed for nausea or vomiting. 30 tablet 0  . rizatriptan (MAXALT) 10 MG tablet Take 1 tablet (10 mg total) by mouth as needed. May repeat in 2 hours if needed 36 tablet 4  . diclofenac sodium (VOLTAREN) 1 % GEL Apply 4 g topically 4 (four) times daily. To affected joint. 100 g 11   No current facility-administered medications for this visit.    Allergies  Allergen Reactions  . Banana Hives  . Clonazepam Other (See Comments)    Makes her feel weird.       Discussed warning signs or symptoms. Please see discharge instructions. Patient expresses understanding.  I personally was present and performed or re-performed the history, physical exam and medical decision-making activities of this service and have verified that the service  and findings are accurately documented in the student's note. ___________________________________________ Clementeen Graham M.D., ABFM., CAQSM. Primary Care and Sports Medicine Adjunct Instructor of Family Medicine  [employer]

## 2018-07-28 NOTE — Patient Instructions (Signed)
Thank you for coming in today. Apply the diclofenac gel up to 4x daily for pain.  Use ice and compression as needed.  I will get results from knee fluid soon and records from [facility] soon.  Keep me updated.  If not much benefit and not much better recheck.   Call or go to the ER if you develop a large red swollen joint with extreme pain or oozing puss.    Gout  Gout is painful swelling of your joints. Gout is a type of arthritis. It is caused by having too much uric acid in your body. Uric acid is a chemical that is made when your body breaks down substances called purines. If your body has too much uric acid, sharp crystals can form and build up in your joints. This causes pain and swelling. Gout attacks can happen quickly and be very painful (acute gout). Over time, the attacks can affect more joints and happen more often (chronic gout). What are the causes?  Too much uric acid in your blood. This can happen because: ? Your kidneys do not remove enough uric acid from your blood. ? Your body makes too much uric acid. ? You eat too many foods that are high in purines. These foods include organ meats, some seafood, and beer.  Trauma or stress. What increases the risk?  Having a family history of gout.  Being female and middle-aged.  Being female and having gone through menopause.  Being very overweight (obese).  Drinking alcohol, especially beer.  Not having enough water in the body (being dehydrated).  Losing weight too quickly.  Having an organ transplant.  Having lead poisoning.  Taking certain medicines.  Having kidney disease.  Having a skin condition called psoriasis. What are the signs or symptoms? An attack of acute gout usually happens in just one joint. The most common place is the big toe. Attacks often start at night. Other joints that may be affected include joints of the feet, ankle, knee, fingers, wrist, or elbow. Symptoms of an attack may include:  Very  bad pain.  Warmth.  Swelling.  Stiffness.  Shiny, red, or purple skin.  Tenderness. The affected joint may be very painful to touch.  Chills and fever. Chronic gout may cause symptoms more often. More joints may be involved. You may also have white or yellow lumps (tophi) on your hands or feet or in other areas near your joints. How is this treated?  Treatment for this condition has two phases: treating an acute attack and preventing future attacks.  Acute gout treatment may include: ? NSAIDs. ? Steroids. These are taken by mouth or injected into a joint. ? Colchicine. This medicine relieves pain and swelling. It can be given by mouth or through an IV tube.  Preventive treatment may include: ? Taking small doses of NSAIDs or colchicine daily. ? Using a medicine that reduces uric acid levels in your blood. ? Making changes to your diet. You may need to see a food expert (dietitian) about what to eat and drink to prevent gout. Follow these instructions at home: During a gout attack   If told, put ice on the painful area: ? Put ice in a plastic bag. ? Place a towel between your skin and the bag. ? Leave the ice on for 20 minutes, 2-3 times a day.  Raise (elevate) the painful joint above the level of your heart as often as you can.  Rest the joint as much as possible.  If the joint is in your leg, you may be given crutches.  Follow instructions from your doctor about what you cannot eat or drink. Avoiding future gout attacks  Eat a low-purine diet. Avoid foods and drinks such as: ? Liver. ? Kidney. ? Anchovies. ? Asparagus. ? Herring. ? Mushrooms. ? Mussels. ? Beer.  Stay at a healthy weight. If you want to lose weight, talk with your doctor. Do not lose weight too fast.  Start or continue an exercise plan as told by your doctor. Eating and drinking  Drink enough fluids to keep your pee (urine) pale yellow.  If you drink alcohol: ? Limit how much you use to:   0-1 drink a day for women.  0-2 drinks a day for men. ? Be aware of how much alcohol is in your drink. In the [place] one drink equals one 12 oz bottle of beer (355 mL), one 5 oz glass of wine (148 mL), or one 1 oz glass of hard liquor (44 mL). General instructions  Take over-the-counter and prescription medicines only as told by your doctor.  Do not drive or use heavy machinery while taking prescription pain medicine.  Return to your normal activities as told by your doctor. Ask your doctor what activities are safe for you.  Keep all follow-up visits as told by your doctor. This is important. Contact a doctor if:  You have another gout attack.  You still have symptoms of a gout attack after 10 days of treatment.  You have problems (side effects) because of your medicines.  You have chills or a fever.  You have burning pain when you pee (urinate).  You have pain in your lower back or belly. Get help right away if:  You have very bad pain.  Your pain cannot be controlled.  You cannot pee. Summary  Gout is painful swelling of the joints.  The most common site of pain is the big toe, but it can affect other joints.  Medicines and avoiding some foods can help to prevent and treat gout attacks. This information is not intended to replace advice given to you by your health care provider. Make sure you discuss any questions you have with your health care provider. Document Released: 10/15/2007 Document Revised: 07/28/2017 Document Reviewed: 07/28/2017 Elsevier Patient Education  2020 [employer].

## 2018-07-28 NOTE — Progress Notes (Signed)
Note duplication error 

## 2018-08-01 ENCOUNTER — Ambulatory Visit: Payer: BC Managed Care – PPO | Admitting: Family Medicine

## 2018-08-02 ENCOUNTER — Encounter: Payer: Self-pay | Admitting: Family Medicine

## 2018-08-02 ENCOUNTER — Other Ambulatory Visit: Payer: Self-pay

## 2018-08-02 ENCOUNTER — Ambulatory Visit (INDEPENDENT_AMBULATORY_CARE_PROVIDER_SITE_OTHER): Payer: BC Managed Care – PPO | Admitting: Family Medicine

## 2018-08-02 ENCOUNTER — Ambulatory Visit (INDEPENDENT_AMBULATORY_CARE_PROVIDER_SITE_OTHER): Payer: BC Managed Care – PPO

## 2018-08-02 VITALS — BP 137/87 | HR 67 | Temp 97.9°F | Wt 217.9 lb

## 2018-08-02 DIAGNOSIS — M47816 Spondylosis without myelopathy or radiculopathy, lumbar region: Secondary | ICD-10-CM | POA: Diagnosis not present

## 2018-08-02 DIAGNOSIS — M5416 Radiculopathy, lumbar region: Secondary | ICD-10-CM

## 2018-08-02 DIAGNOSIS — M25562 Pain in left knee: Secondary | ICD-10-CM

## 2018-08-02 MED ORDER — GABAPENTIN 300 MG PO CAPS: ORAL_CAPSULE | ORAL | 3 refills | Status: DC

## 2018-08-02 NOTE — Progress Notes (Signed)
Kristen Spence is a 58 y.o. female who presents to [facility] today for follow-up left knee pain.  Patient was seen on July 9 for left knee pain and knee effusion.  She had aspiration and injection of left knee.  Aspirate was largely normal-appearing with no crystals.  She notes the injection only helped for about an hour.  She notes considerable medial knee pain.  Pain is worse with activity.  She had does have some pain radiating down the medial knee to the medial calf and dorsal foot.  She notes her symptoms are quite bothersome.  She rates them sometimes as severe.  She denies severe clicking or catching or locking or giving way.  She cannot recall a specific injury that may have caused this.    ROS:  As above  Exam:  BP 137/87   Pulse 67   Temp 97.9 F (36.6 C) (Oral)   Wt 217 lb 14.4 oz (98.8 kg)   BMI 39.85 kg/m  Wt Readings from Last 5 Encounters:  08/02/18 217 lb 14.4 oz (98.8 kg)  07/28/18 221 lb (100.2 kg)  05/17/18 216 lb (98 kg)  04/19/18 222 lb (100.7 kg)  03/14/18 225 lb (102.1 kg)   General: Well Developed, well nourished, and in no acute distress.  Neuro/Psych: Alert and oriented x3, extra-ocular muscles intact, able to move all 4 extremities, sensation grossly intact. Skin: Warm and dry, no rashes noted.  Respiratory: Not using accessory muscles, speaking in full sentences, trachea midline.  Cardiovascular: Pulses palpable, no extremity edema. Abdomen: Does not appear distended. MSK: Left knee: Relatively normal-appearing with no large effusion or erythema. Range of motion 0-120 degrees with mild crepitations. Tender palpation medial joint line. Guarding with ligament testing.  No severe laxity  positive medial McMurray's test.  L-spine: Nontender to spinal midline.  Normal lumbar motion negative slump test.  Lower extremity strength reflexes and sensation are equal normal throughout.     Lab and Radiology  Results  X-ray images L-spine obtained today personally independently reviewed X-ray shows mild to moderate degenerative changes mostly around L5-S1.  No acute fractures.  No spondylolisthesis. Await formal radiology over read  Patient provided images of knee x-ray below from 7/4 My personal interpretation of the below images show mild medial compartment DJD and mild patellofemoral DJD.  No fractures.  Mild effusion.         Assessment and Plan: 58 y.o. female with left knee pain.  Unclear etiology.  Pain is likely coming from the medial knee likely MCL.  However certainly her pain could be L4 radiculopathy as that also is in the similar distribution.  Plan for trial of gabapentin.  However if not improving likely neck step is going to be MRI knee.  She has more pain than I can explain given the imaging and test results I have available to me.  If not improving with typical conservative management will likely will proceed with MRI for surgical planning possibly.   PDMP not reviewed this encounter. Orders Placed This Encounter  Procedures  . DG Lumbar Spine Complete    Standing Status:   Future    Number of Occurrences:   1    Standing Expiration Date:   10/03/2019    Order Specific Question:   Reason for Exam (SYMPTOM  OR DIAGNOSIS REQUIRED)    Answer:   eval poss left L4 radicuopathy    Order Specific Question:   Is patient pregnant?  Answer:   No    Order Specific Question:   Preferred imaging location?    Answer:   [facility]    Order Specific Question:   Radiology Contrast Protocol - do NOT remove file path    Answer:   [email or web address]   Meds ordered this encounter  Medications  . gabapentin (NEURONTIN) 300 MG capsule    Sig: One tab PO qHS for a week, then BID for a week, then TID. May double weekly to a max of 3,600mg /day    Dispense:  180 capsule    Refill:  3      Historical information moved to improve  visibility of documentation.  Past Medical History:  Diagnosis Date  . Benign positional vertigo   . Dizziness   . Edema   . Fatigue   . GERD (gastroesophageal reflux disease)    hiatal hernia  . Heart palpitations   . Hiatal hernia   . Hypertension   . Insomnia   . Mental disorder   . Migraine    Past Surgical History:  Procedure Laterality Date  . BREAST BIOPSY    . BREAST EXCISIONAL BIOPSY Left   . BREAST LUMPECTOMY  2000   . [place]  . CHOLECYSTECTOMY  1987  . ENDOMETRIAL ABLATION  09/30/2006  . ESOPHAGOGASTRODUODENOSCOPY     Showed grade D. reflux esophagitis  . TOTAL VAGINAL HYSTERECTOMY  09/27/2008   Dr. Huntley Estelle   Social History   Tobacco Use  . Smoking status: Never Smoker  . Smokeless tobacco: Never Used  Substance Use Topics  . Alcohol use: No   family history includes Alcohol abuse in her father; Breast cancer in her sister; COPD in her mother; Depression in her mother; Diabetes in her mother; Heart attack (age of onset: 20) in her sister; Hyperlipidemia in her mother; Hypertension in her father and mother; Ovarian cancer in her mother; Stroke in her father.  Medications: Current Outpatient Medications  Medication Sig Dispense Refill  . ALPRAZolam (XANAX) 0.5 MG tablet TAKE 1 TABLET (0.5 MG TOTAL) BY MOUTH 2 (TWO) TIMES DAILY AS NEEDED. 45 tablet 1  . amLODipine (NORVASC) 5 MG tablet TAKE 1 TABLET BY MOUTH EVERY DAY 90 tablet 0  . cholecalciferol (VITAMIN D) 1000 units tablet Take 2,000 Units by mouth daily.    . diclofenac sodium (VOLTAREN) 1 % GEL Apply 4 g topically 4 (four) times daily. To affected joint. 100 g 11  . DULoxetine (CYMBALTA) 60 MG capsule Take 1 capsule (60 mg total) by mouth 2 (two) times daily. 180 capsule 1  . gabapentin (NEURONTIN) 300 MG capsule One tab PO qHS for a week, then BID for a week, then TID. May double weekly to a max of 3,600mg /day 180 capsule 3  . ipratropium (ATROVENT) 0.03 % nasal spray Place 2  sprays into both nostrils every 12 (twelve) hours. 30 mL 2  . lisinopril-hydrochlorothiazide (ZESTORETIC) 20-25 MG tablet TAKE 1 TABLET BY MOUTH EVERY DAY 90 tablet 1  . montelukast (SINGULAIR) 10 MG tablet TAKE 1 TABLET BY MOUTH EVERYDAY AT BEDTIME 90 tablet 3  . omeprazole (PRILOSEC) 20 MG capsule Take 1-2 capsules (20-40 mg total) by mouth daily. 180 capsule 3  . promethazine (PHENERGAN) 25 MG tablet Take 1 tablet (25 mg total) by mouth every 4 (four) hours as needed for nausea or vomiting. 30 tablet 0  . rizatriptan (MAXALT) 10 MG tablet Take 1 tablet (10 mg total) by mouth as needed. May repeat in 2  hours if needed 36 tablet 4   No current facility-administered medications for this visit.    Allergies  Allergen Reactions  . Banana Hives  . Clonazepam Other (See Comments)    Makes her feel weird.       Discussed warning signs or symptoms. Please see discharge instructions. Patient expresses understanding.

## 2018-08-02 NOTE — Patient Instructions (Addendum)
Thank you for coming in today. Get xray lumbar spine now.  Take gabapentin for pain.  Use voltaren gel.  If not improving let me know.  We will likley do MRI knee.    Radicular Pain Radicular pain is a type of pain that spreads from your back or neck along a spinal nerve. Spinal nerves are nerves that leave the spinal cord and go to the muscles. Radicular pain is sometimes called radiculopathy, radiculitis, or a pinched nerve. When you have this type of pain, you may also have weakness, numbness, or tingling in the area of your body that is supplied by the nerve. The pain may feel sharp and burning. Depending on which spinal nerve is affected, the pain may occur in the:  Neck area (cervical radicular pain). You may also feel pain, numbness, weakness, or tingling in the arms.  Mid-spine area (thoracic radicular pain). You would feel this pain in the back and chest. This type is rare.  Lower back area (lumbar radicular pain). You would feel this pain as low back pain. You may feel pain, numbness, weakness, or tingling in the buttocks or legs. Sciatica is a type of lumbar radicular pain that shoots down the back of the leg. Radicular pain occurs when one of the spinal nerves becomes irritated or squeezed (compressed). It is often caused by something pushing on a spinal nerve, such as one of the bones of the spine (vertebrae) or one of the round cushions between vertebrae (intervertebral disks). This can result from:  An injury.  Wear and tear or aging of a disk.  The growth of a bone spur that pushes on the nerve. Radicular pain often goes away when you follow instructions from your health care provider for relieving pain at home. Follow these instructions at home: Managing pain      If directed, put ice on the affected area: ? Put ice in a plastic bag. ? Place a towel between your skin and the bag. ? Leave the ice on for 20 minutes, 2-3 times a day.  If directed, apply heat to the  affected area as often as told by your health care provider. Use the heat source that your health care provider recommends, such as a moist heat pack or a heating pad. ? Place a towel between your skin and the heat source. ? Leave the heat on for 20-30 minutes. ? Remove the heat if your skin turns bright red. This is especially important if you are unable to feel pain, heat, or cold. You may have a greater risk of getting burned. Activity   Do not sit or rest in bed for long periods of time.  Try to stay as active as possible. Ask your health care provider what type of exercise or activity is best for you.  Avoid activities that make your pain worse, such as bending and lifting.  Do not lift anything that is heavier than 10 lb (4.5 kg), or the limit that you are told, until your health care provider says that it is safe.  Practice using proper technique when lifting items. Proper lifting technique involves bending your knees and rising up.  Do strength and range-of-motion exercises only as told by your health care provider or physical therapist. General instructions  Take over-the-counter and prescription medicines only as told by your health care provider.  Pay attention to any changes in your symptoms.  Keep all follow-up visits as told by your health care provider. This is important. ?  Your health care provider may send you to a physical therapist to help with this pain. Contact a health care provider if:  Your pain and other symptoms get worse.  Your pain medicine is not helping.  Your pain has not improved after a few weeks of home care.  You have a fever. Get help right away if:  You have severe pain, weakness, or numbness.  You have difficulty with bladder or bowel control. Summary  Radicular pain is a type of pain that spreads from your back or neck along a spinal nerve.  When you have radicular pain, you may also have weakness, numbness, or tingling in the area of  your body that is supplied by the nerve.  The pain may feel sharp or burning.  Radicular pain may be treated with ice, heat, medicines, or physical therapy. This information is not intended to replace advice given to you by your health care provider. Make sure you discuss any questions you have with your health care provider. Document Released: 02/13/2004 Document Revised: 07/20/2017 Document Reviewed: 07/20/2017 Elsevier Patient Education  2020 [employer].

## 2018-08-03 LAB — SYNOVIAL CELL COUNT + DIFF, W/ CRYSTALS
Basophils, %: 0 %
Eosinophils-Synovial: 0 % (ref 0–2)
Lymphocytes-Synovial Fld: 47 % (ref 0–74)
Monocyte/Macrophage: 31 % (ref 0–69)
Neutrophil, Synovial: 21 % (ref 0–24)
Synoviocytes, %: 1 % (ref 0–15)
WBC, Synovial: 151 cells/uL — ABNORMAL HIGH (ref <150)

## 2018-08-03 LAB — ANAEROBIC AND AEROBIC CULTURE
AER RESULT:: NO GROWTH
GRAM STAIN:: NONE SEEN
MICRO NUMBER:: 651921
MICRO NUMBER:: 651922
SPECIMEN QUALITY:: ADEQUATE
SPECIMEN QUALITY:: ADEQUATE

## 2018-08-05 ENCOUNTER — Telehealth: Payer: Self-pay | Admitting: Family Medicine

## 2018-08-05 DIAGNOSIS — M25462 Effusion, left knee: Secondary | ICD-10-CM

## 2018-08-05 DIAGNOSIS — M25562 Pain in left knee: Secondary | ICD-10-CM

## 2018-08-05 NOTE — Telephone Encounter (Signed)
MRI of left knee ordered

## 2018-08-05 NOTE — Telephone Encounter (Signed)
-----   Message from Kristen Larsen, LPN sent at 6/48/4720 10:04 AM EDT ----- Pt states knee is burning- like it is on fire. Then burning goes down the leg. Pt stated the medication is not helping very much, would like to proceed with MRI

## 2018-08-05 NOTE — Telephone Encounter (Signed)
Patient advised. Kristen Spence is working on the [place].

## 2018-08-07 ENCOUNTER — Other Ambulatory Visit: Payer: Self-pay

## 2018-08-07 ENCOUNTER — Ambulatory Visit (INDEPENDENT_AMBULATORY_CARE_PROVIDER_SITE_OTHER): Payer: BC Managed Care – PPO

## 2018-08-07 DIAGNOSIS — M25462 Effusion, left knee: Secondary | ICD-10-CM

## 2018-08-07 DIAGNOSIS — M25562 Pain in left knee: Secondary | ICD-10-CM | POA: Diagnosis not present

## 2018-08-10 DIAGNOSIS — M25562 Pain in left knee: Secondary | ICD-10-CM | POA: Diagnosis not present

## 2018-08-10 DIAGNOSIS — M1712 Unilateral primary osteoarthritis, left knee: Secondary | ICD-10-CM | POA: Diagnosis not present

## 2018-08-30 ENCOUNTER — Telehealth: Payer: Self-pay | Admitting: Family Medicine

## 2018-08-30 ENCOUNTER — Other Ambulatory Visit: Payer: Self-pay

## 2018-08-30 ENCOUNTER — Encounter: Payer: Self-pay | Admitting: Family Medicine

## 2018-08-30 ENCOUNTER — Ambulatory Visit (INDEPENDENT_AMBULATORY_CARE_PROVIDER_SITE_OTHER): Payer: BC Managed Care – PPO | Admitting: Family Medicine

## 2018-08-30 VITALS — BP 149/81 | HR 70 | Temp 98.2°F | Wt 220.0 lb

## 2018-08-30 DIAGNOSIS — M25562 Pain in left knee: Secondary | ICD-10-CM

## 2018-08-30 DIAGNOSIS — R300 Dysuria: Secondary | ICD-10-CM

## 2018-08-30 LAB — POCT URINALYSIS DIPSTICK
Bilirubin, UA: NEGATIVE
Glucose, UA: NEGATIVE
Ketones, UA: NEGATIVE
Nitrite, UA: POSITIVE
Protein, UA: NEGATIVE
Spec Grav, UA: 1.025 (ref 1.010–1.025)
Urobilinogen, UA: 0.2 E.U./dL
pH, UA: 5.5 (ref 5.0–8.0)

## 2018-08-30 MED ORDER — CEFDINIR 300 MG PO CAPS: 300.0000 mg | ORAL_CAPSULE | Freq: Two times a day (BID) | ORAL | 0 refills | Status: DC

## 2018-08-30 NOTE — Telephone Encounter (Signed)
Received documentation from [facility] regarding knee injury and pain.  X-ray report shows large joint effusion with possible loose bodies and mild DJD.

## 2018-08-30 NOTE — Progress Notes (Signed)
Kristen Spence is a 58 y.o. female who [facility] Kristen Spence: Primary Care Sports Medicine today for left knee pain and swelling.  Additionally patient has foul-smelling urine and is worried about possible urinary tract infection.  Left knee pain and swelling: Patient has been seen several times for left knee pain and swelling.  She had aspiration and injection with little benefit.  Ultimately she had MRI which showed compartmental DJD as well as complicated small meniscus tear and fluid tracking along medial hamstring tendon with possible Pez bursitis.  She ultimately had evaluation with orthopedic surgery who wanted to try further conservative management including hyaluronic acid injection which was too expensive.  She is here today for recheck and reevaluation.  She locates the majority of her pain at the anterior medial knee along the medial femoral condyle.  She notes swelling in this region and notes pain and discomfort especially with ambulation and knee flexion.  Additionally she notes foul-smelling urine and a little bit of urinary frequency urgency and dysuria over the last few days.  She is worried that she may be developing a urinary tract infection.  No fevers chills nausea vomiting or diarrhea.   ROS as above:  Exam:  BP (!) 149/81   Pulse 70   Temp 98.2 F (36.8 C) (Oral)   Wt 220 lb (99.8 kg)   BMI 40.24 kg/m  Wt Readings from Last 5 Encounters:  08/30/18 220 lb (99.8 kg)  08/02/18 217 lb 14.4 oz (98.8 kg)  07/28/18 221 lb (100.2 kg)  05/17/18 216 lb (98 kg)  04/19/18 222 lb (100.7 kg)    Gen: Well NAD HEENT: EOMI,  MMM Lungs: Normal work of breathing. CTABL Heart: RRR no MRG Abd: NABS, Soft. Nondistended, Nontender Exts: Brisk capillary refill, warm and well perfused.  Left knee: Normal-appearing with no severe large effusion. Range of motion 0-120 degrees.  Tender  palpation anterior knee along medial joint line.  Not particular tender at [facility] an[facility] bursa region. Intact flexion and extension strength.   Lab and Radiology Results Results for orders placed or performed in visit on 08/30/18 (from the past 72 hour(s))  POCT Urinalysis Dipstick     Status: Abnormal   Collection Time: 08/30/18  4:53 PM  Result Value Ref Range   Color, UA     Clarity, UA     Glucose, UA Negative Negative   Bilirubin, UA neg    Ketones, UA neg    Spec Grav, UA 1.025 1.010 - 1.025   Blood, UA moderate    pH, UA 5.5 5.0 - 8.0   Protein, UA Negative Negative   Urobilinogen, UA 0.2 0.2 or 1.0 E.U./dL   Nitrite, UA pos    Leukocytes, UA Small (1+) (A) Negative   Appearance     Odor     No results found.  Limited musculoskeletal ultrasound of left knee reveals trace fluid tracking along medial hamstring tendon and Pez anserine region with minimal bursa.  Not particular tender to palpation with ultrasound probe. However ultrasound evaluation of area of maximal tenderness at the anterior medial femoral condyle reveals a small little spur near the medial side of the patellofemoral joint.  There is hypoechoic fluid tracking along this region and tenderness to palpation with ultrasound probe.  Procedure: Real-time Ultrasound Guided Injection of small effusion at area maximum tenderness at anterior medial femoral condyle Device: GE Logiq E   Images permanently stored and available for review  in the ultrasound unit. Verbal informed consent obtained.  Discussed risks and benefits of procedure. Warned about infection bleeding damage to structures skin hypopigmentation and fat atrophy among others. Patient expresses understanding and agreement Time-out conducted.   Noted no overlying erythema, induration, or other signs of local infection.   Skin prepped in a sterile fashion.   Local anesthesia: Topical Ethyl chloride.   With sterile technique and under real time ultrasound  guidance:  80 mg of Depo-Medrol and 2 mL of Marcaine injected easily.   Completed without difficulty   Pain partially resolved suggesting accurate placement of the medication.   Advised to call if fevers/chills, erythema, induration, drainage, or persistent bleeding.   Images permanently stored and available for review in the ultrasound unit.  Impression: Technically successful ultrasound guided injection.       Assessment and Plan: 58 y.o. female with knee pain.  Difficult to control.  Patient does not respond to typical survey of measures.  Plan for ultrasound-guided injection area maximal tenderness today.  If no benefit would consider possible Pez anserine bursa injection over the cyst does not appear to be the majority of her source of pain.  Plan to continue work on conservative measures including strengthening and stretching exercises diclofenac gel and relative rest.  Dysuria: Point-of-care urinalysis dipstick consistent with UTI.  Plan for culture.  Empiric treatment with Omnicef.  Recheck if not improving.  PDMP not reviewed this encounter. Orders Placed This Encounter  Procedures  . Urine Culture  . POCT Urinalysis Dipstick   Meds ordered this encounter  Medications  . cefdinir (OMNICEF) 300 MG capsule    Sig: Take 1 capsule (300 mg total) by mouth 2 (two) times daily.    Dispense:  14 capsule    Refill:  0     Historical information moved to improve visibility of documentation.  Past Medical History:  Diagnosis Date  . Benign positional vertigo   . Dizziness   . Edema   . Fatigue   . GERD (gastroesophageal reflux disease)    hiatal hernia  . Heart palpitations   . Hiatal hernia   . Hypertension   . Insomnia   . Mental disorder   . Migraine    Past Surgical History:  Procedure Laterality Date  . BREAST BIOPSY    . BREAST EXCISIONAL BIOPSY Left   . BREAST LUMPECTOMY  2000   . [place]  . CHOLECYSTECTOMY  1987  . ENDOMETRIAL ABLATION   09/30/2006  . ESOPHAGOGASTRODUODENOSCOPY     Showed grade D. reflux esophagitis  . TOTAL VAGINAL HYSTERECTOMY  09/27/2008   Dr. Huntley Estelle   Social History   Tobacco Use  . Smoking status: Never Smoker  . Smokeless tobacco: Never Used  Substance Use Topics  . Alcohol use: No   family history includes Alcohol abuse in her father; Breast cancer in her sister; COPD in her mother; Depression in her mother; Diabetes in her mother; Heart attack (age of onset: 32) in her sister; Hyperlipidemia in her mother; Hypertension in her father and mother; Ovarian cancer in her mother; Stroke in her father.  Medications: Current Outpatient Medications  Medication Sig Dispense Refill  . ALPRAZolam (XANAX) 0.5 MG tablet TAKE 1 TABLET (0.5 MG TOTAL) BY MOUTH 2 (TWO) TIMES DAILY AS NEEDED. 45 tablet 1  . amLODipine (NORVASC) 5 MG tablet TAKE 1 TABLET BY MOUTH EVERY DAY 90 tablet 0  . cefdinir (OMNICEF) 300 MG capsule Take 1 capsule (300 mg total)  by mouth 2 (two) times daily. 14 capsule 0  . cholecalciferol (VITAMIN D) 1000 units tablet Take 2,000 Units by mouth daily.    . diclofenac sodium (VOLTAREN) 1 % GEL Apply 4 g topically 4 (four) times daily. To affected joint. 100 g 11  . DULoxetine (CYMBALTA) 60 MG capsule Take 1 capsule (60 mg total) by mouth 2 (two) times daily. 180 capsule 1  . gabapentin (NEURONTIN) 300 MG capsule One tab PO qHS for a week, then BID for a week, then TID. May double weekly to a max of 3,600mg /day 180 capsule 3  . ipratropium (ATROVENT) 0.03 % nasal spray Place 2 sprays into both nostrils every 12 (twelve) hours. 30 mL 2  . lisinopril-hydrochlorothiazide (ZESTORETIC) 20-25 MG tablet TAKE 1 TABLET BY MOUTH EVERY DAY 90 tablet 1  . montelukast (SINGULAIR) 10 MG tablet TAKE 1 TABLET BY MOUTH EVERYDAY AT BEDTIME 90 tablet 3  . omeprazole (PRILOSEC) 20 MG capsule Take 1-2 capsules (20-40 mg total) by mouth daily. 180 capsule 3  . promethazine (PHENERGAN) 25 MG tablet Take 1  tablet (25 mg total) by mouth every 4 (four) hours as needed for nausea or vomiting. 30 tablet 0  . rizatriptan (MAXALT) 10 MG tablet Take 1 tablet (10 mg total) by mouth as needed. May repeat in 2 hours if needed 36 tablet 4   No current facility-administered medications for this visit.    Allergies  Allergen Reactions  . Banana Hives  . Clonazepam Other (See Comments)    Makes her feel weird.      Discussed warning signs or symptoms. Please see discharge instructions. Patient expresses understanding.

## 2018-08-30 NOTE — Patient Instructions (Addendum)
Thank you for coming in today. Lets see how you respond with the injection.  Keep me updated.  For UTI take omnicef twice daily for 1 week.  I will get the urine culture results to you ASAP.  Call or go to the ER if you develop a large red swollen joint with extreme pain or oozing puss.    Urinary Tract Infection, Adult  A urinary tract infection (UTI) is an infection of any part of the urinary tract. The urinary tract includes the kidneys, ureters, bladder, and urethra. These organs make, store, and get rid of urine in the body. Your health care provider may use other names to describe the infection. An upper UTI affects the ureters and kidneys (pyelonephritis). A lower UTI affects the bladder (cystitis) and urethra (urethritis). What are the causes? Most urinary tract infections are caused by bacteria in your genital area, around the entrance to your urinary tract (urethra). These bacteria grow and cause inflammation of your urinary tract. What increases the risk? You are more likely to develop this condition if:  You have a urinary catheter that stays in place (indwelling).  You are not able to control when you urinate or have a bowel movement (you have incontinence).  You are female and you: ? Use a spermicide or diaphragm for birth control. ? Have low estrogen levels. ? Are pregnant.  You have certain genes that increase your risk (genetics).  You are sexually active.  You take antibiotic medicines.  You have a condition that causes your flow of urine to slow down, such as: ? An enlarged prostate, if you are female. ? Blockage in your urethra (stricture). ? A kidney stone. ? A nerve condition that affects your bladder control (neurogenic bladder). ? Not getting enough to drink, or not urinating often.  You have certain medical conditions, such as: ? Diabetes. ? A weak disease-fighting system (immunesystem). ? Sickle cell disease. ? Gout. ? Spinal cord injury. What are  the signs or symptoms? Symptoms of this condition include:  Needing to urinate right away (urgently).  Frequent urination or passing small amounts of urine frequently.  Pain or burning with urination.  Blood in the urine.  Urine that smells bad or unusual.  Trouble urinating.  Cloudy urine.  Vaginal discharge, if you are female.  Pain in the abdomen or the lower back. You may also have:  Vomiting or a decreased appetite.  Confusion.  Irritability or tiredness.  A fever.  Diarrhea. The first symptom in older adults may be confusion. In some cases, they may not have any symptoms until the infection has worsened. How is this diagnosed? This condition is diagnosed based on your medical history and a physical exam. You may also have other tests, including:  Urine tests.  Blood tests.  Tests for sexually transmitted infections (STIs). If you have had more than one UTI, a cystoscopy or imaging studies may be done to determine the cause of the infections. How is this treated? Treatment for this condition includes:  Antibiotic medicine.  Over-the-counter medicines to treat discomfort.  Drinking enough water to stay hydrated. If you have frequent infections or have other conditions such as a kidney stone, you may need to see a health care provider who specializes in the urinary tract (urologist). In rare cases, urinary tract infections can cause sepsis. Sepsis is a life-threatening condition that occurs when the body responds to an infection. Sepsis is treated in the hospital with IV antibiotics, fluids, and other medicines.  Follow these instructions at home:  Medicines  Take over-the-counter and prescription medicines only as told by your health care provider.  If you were prescribed an antibiotic medicine, take it as told by your health care provider. Do not stop using the antibiotic even if you start to feel better. General instructions  Make sure you: ? Empty  your bladder often and completely. Do not hold urine for long periods of time. ? Empty your bladder after sex. ? Wipe from front to back after a bowel movement if you are female. Use each tissue one time when you wipe.  Drink enough fluid to keep your urine pale yellow.  Keep all follow-up visits as told by your health care provider. This is important. Contact a health care provider if:  Your symptoms do not get better after 1-2 days.  Your symptoms go away and then return. Get help right away if you have:  Severe pain in your back or your lower abdomen.  A fever.  Nausea or vomiting. Summary  A urinary tract infection (UTI) is an infection of any part of the urinary tract, which includes the kidneys, ureters, bladder, and urethra.  Most urinary tract infections are caused by bacteria in your genital area, around the entrance to your urinary tract (urethra).  Treatment for this condition often includes antibiotic medicines.  If you were prescribed an antibiotic medicine, take it as told by your health care provider. Do not stop using the antibiotic even if you start to feel better.  Keep all follow-up visits as told by your health care provider. This is important. This information is not intended to replace advice given to you by your health care provider. Make sure you discuss any questions you have with your health care provider. Document Released: 10/15/2004 Document Revised: 12/23/2017 Document Reviewed: 07/15/2017 Elsevier Patient Education  2020 [employer]

## 2018-09-01 LAB — URINE CULTURE
MICRO NUMBER:: 758942
SPECIMEN QUALITY:: ADEQUATE

## 2018-09-08 ENCOUNTER — Telehealth: Payer: Self-pay

## 2018-09-08 NOTE — Telephone Encounter (Signed)
Kristen Spence called and states she has cold sores. She would like a prescription of Valtrex. Last prescribed 2017.

## 2018-09-09 MED ORDER — VALACYCLOVIR HCL 1 G PO TABS: 1000.0000 mg | ORAL_TABLET | Freq: Two times a day (BID) | ORAL | 1 refills | Status: DC

## 2018-09-09 NOTE — Telephone Encounter (Signed)
Patient advised.

## 2018-09-09 NOTE — Telephone Encounter (Signed)
rx sent  Thank you

## 2018-09-12 ENCOUNTER — Other Ambulatory Visit: Payer: Self-pay

## 2018-09-12 ENCOUNTER — Ambulatory Visit (INDEPENDENT_AMBULATORY_CARE_PROVIDER_SITE_OTHER): Payer: BC Managed Care – PPO | Admitting: Family Medicine

## 2018-09-12 ENCOUNTER — Encounter: Payer: Self-pay | Admitting: Family Medicine

## 2018-09-12 VITALS — BP 119/62 | HR 75 | Ht 62.0 in | Wt 221.0 lb

## 2018-09-12 DIAGNOSIS — I10 Essential (primary) hypertension: Secondary | ICD-10-CM

## 2018-09-12 DIAGNOSIS — B002 Herpesviral gingivostomatitis and pharyngotonsillitis: Secondary | ICD-10-CM

## 2018-09-12 DIAGNOSIS — F324 Major depressive disorder, single episode, in partial remission: Secondary | ICD-10-CM | POA: Diagnosis not present

## 2018-09-12 DIAGNOSIS — G43109 Migraine with aura, not intractable, without status migrainosus: Secondary | ICD-10-CM

## 2018-09-12 MED ORDER — PROMETHAZINE HCL 25 MG PO TABS: 25.0000 mg | ORAL_TABLET | ORAL | 6 refills | Status: DC | PRN

## 2018-09-12 MED ORDER — VALACYCLOVIR HCL 1 G PO TABS: 1000.0000 mg | ORAL_TABLET | Freq: Two times a day (BID) | ORAL | 1 refills | Status: DC

## 2018-09-12 MED ORDER — ALPRAZOLAM 0.5 MG PO TABS: ORAL_TABLET | ORAL | 1 refills | Status: DC

## 2018-09-12 MED ORDER — MELOXICAM 15 MG PO TABS: 15.0000 mg | ORAL_TABLET | Freq: Every day | ORAL | 0 refills | Status: DC

## 2018-09-12 MED ORDER — FLUCONAZOLE 150 MG PO TABS: 150.0000 mg | ORAL_TABLET | Freq: Once | ORAL | 2 refills | Status: AC

## 2018-09-12 NOTE — Assessment & Plan Note (Signed)
Made sure valacyclovir had a refill on it.

## 2018-09-12 NOTE — Assessment & Plan Note (Signed)
Happy with current regimen of Cymbalta and as needed alprazolam.  Continue current regimen.  Overall feels like she is doing pretty well.  Follow-up in 6 months.

## 2018-09-12 NOTE — Assessment & Plan Note (Signed)
Blood pressure looks fantastic today.  Continue current regimen.  Follow-up in 6 months. 

## 2018-09-12 NOTE — Progress Notes (Signed)
Established Patient Office Visit  Subjective:  Patient ID: Kristen Spence, female    DOB: [birth year]  Age: 58 y.o. MRN: [id number]  CC:  Chief Complaint  Patient presents with  . Hypertension  . Migraine    HPI Kristen Spence presents for   Hypertension- Pt denies chest pain, SOB, dizziness, or heart palpitations.  Taking meds as directed w/o problems.  Denies medication side effects.    F/U migraines -overall she is doing well she has not had quite as many migraines that she was previously having she says the Maxalt does seem to work well when she uses it.  Follow-up major depressive disorder-doing well on Cymbalta.  She says she is happy with her current regimen and dose  Oral  Herpes-she would like to make sure that she has a refill on the valacyclovir.  She had a pretty significant outbreak recently with lip swelling.  She says is just taking a long time to improve.  Past Medical History:  Diagnosis Date  . Benign positional vertigo   . Dizziness   . Edema   . Fatigue   . GERD (gastroesophageal reflux disease)    hiatal hernia  . Heart palpitations   . Hiatal hernia   . Hypertension   . Insomnia   . Mental disorder   . Migraine     Past Surgical History:  Procedure Laterality Date  . BREAST BIOPSY    . BREAST EXCISIONAL BIOPSY Left   . BREAST LUMPECTOMY  2000   . [place]  . CHOLECYSTECTOMY  1987  . ENDOMETRIAL ABLATION  09/30/2006  . ESOPHAGOGASTRODUODENOSCOPY     Showed grade D. reflux esophagitis  . TOTAL VAGINAL HYSTERECTOMY  09/27/2008   Dr. Huntley Estelle    Family History  Problem Relation Age of Onset  . Alcohol abuse Father   . Stroke Father   . Hypertension Father   . Ovarian cancer Mother   . Depression Mother   . Diabetes Mother   . Hyperlipidemia Mother   . Hypertension Mother   . COPD Mother   . Heart attack Sister 27  . Breast cancer Sister     Social History   Socioeconomic History  . Marital status:  Married    Spouse name: bobby   . Number of children: 2  . Years of education: 10th grade  . Highest education level: Not on file  Occupational History  . Occupation:      Employer: [employer]  . Financial resource strain: Not on file  . Food insecurity    Worry: Not on file    Inability: Not on file  . Transportation needs    Medical: Not on file    Non-medical: Not on file  Tobacco Use  . Smoking status: Never Smoker  . Smokeless tobacco: Never Used  Substance and Sexual Activity  . Alcohol use: No  . Drug use: No  . Sexual activity: Never    Birth control/protection: Surgical  Lifestyle  . Physical activity    Days per week: Not on file    Minutes per session: Not on file  . Stress: Not on file  Relationships  . Social Herbalist on phone: Not on file    Gets together: Not on file    Attends religious service: Not on file    Active member of club or organization: Not on file    Attends meetings of clubs or  organizations: Not on file    Relationship status: Not on file  . Intimate partner violence    Fear of current or ex partner: Not on file    Emotionally abused: Not on file    Physically abused: Not on file    Forced sexual activity: Not on file  Other Topics Concern  . Not on file  Social History Narrative   No regular exercise    Outpatient Medications Prior to Visit  Medication Sig Dispense Refill  . cholecalciferol (VITAMIN D) 1000 units tablet Take 2,000 Units by mouth daily.    . diclofenac sodium (VOLTAREN) 1 % GEL Apply 4 g topically 4 (four) times daily. To affected joint. 100 g 11  . DULoxetine (CYMBALTA) 60 MG capsule Take 1 capsule (60 mg total) by mouth 2 (two) times daily. 180 capsule 1  . gabapentin (NEURONTIN) 300 MG capsule One tab PO qHS for a week, then BID for a week, then TID. May double weekly to a max of 3,600mg /day 180 capsule 3  . ipratropium (ATROVENT) 0.03 % nasal spray Place 2 sprays into both nostrils  every 12 (twelve) hours. 30 mL 2  . lisinopril-hydrochlorothiazide (ZESTORETIC) 20-25 MG tablet TAKE 1 TABLET BY MOUTH EVERY DAY 90 tablet 1  . montelukast (SINGULAIR) 10 MG tablet TAKE 1 TABLET BY MOUTH EVERYDAY AT BEDTIME 90 tablet 3  . omeprazole (PRILOSEC) 20 MG capsule Take 1-2 capsules (20-40 mg total) by mouth daily. 180 capsule 3  . rizatriptan (MAXALT) 10 MG tablet Take 1 tablet (10 mg total) by mouth as needed. May repeat in 2 hours if needed 36 tablet 4  . ALPRAZolam (XANAX) 0.5 MG tablet TAKE 1 TABLET (0.5 MG TOTAL) BY MOUTH 2 (TWO) TIMES DAILY AS NEEDED. 45 tablet 1  . promethazine (PHENERGAN) 25 MG tablet Take 1 tablet (25 mg total) by mouth every 4 (four) hours as needed for nausea or vomiting. 30 tablet 0  . valACYclovir (VALTREX) 1000 MG tablet Take 1 tablet (1,000 mg total) by mouth 2 (two) times daily. When needed for outbreatk 30 tablet 1  . amLODipine (NORVASC) 5 MG tablet TAKE 1 TABLET BY MOUTH EVERY DAY 90 tablet 0  . cefdinir (OMNICEF) 300 MG capsule Take 1 capsule (300 mg total) by mouth 2 (two) times daily. 14 capsule 0   No facility-administered medications prior to visit.     Allergies  Allergen Reactions  . Banana Hives  . Clonazepam Other (See Comments)    Makes her feel weird.     ROS Review of Systems    Objective:    Physical Exam  BP 119/62   Pulse 75   Ht 5\' 2"  (1.575 m)   Wt 221 lb (100.2 kg)   SpO2 99%   BMI 40.42 kg/m  Wt Readings from Last 3 Encounters:  09/12/18 221 lb (100.2 kg)  08/30/18 220 lb (99.8 kg)  08/02/18 217 lb 14.4 oz (98.8 kg)     Health Maintenance Due  Topic Date Due  . Hepatitis C Screening  09/24/1960  . HIV Screening  12/15/1976  . COLONOSCOPY  02/20/2015  . MAMMOGRAM  11/03/2017    There are no preventive care reminders to display for this patient.  Lab Results  Component Value Date   TSH 2.05 04/19/2018   Lab Results  Component Value Date   WBC 5.0 04/19/2018   HGB 13.9 04/19/2018   HCT 40.3  04/19/2018   MCV 84.8 04/19/2018   PLT 233 04/19/2018  Lab Results  Component Value Date   NA 140 04/19/2018   K 3.8 04/19/2018   CO2 27 04/19/2018   GLUCOSE 92 04/19/2018   BUN 18 04/19/2018   CREATININE 1.08 (H) 04/19/2018   BILITOT 0.5 04/19/2018   ALKPHOS 74 11/01/2015   AST 25 04/19/2018   ALT 22 04/19/2018   PROT 7.1 04/19/2018   ALBUMIN 4.1 11/01/2015   CALCIUM 8.8 04/19/2018   Lab Results  Component Value Date   CHOL 170 10/08/2016   Lab Results  Component Value Date   HDL 47 (L) 10/08/2016   Lab Results  Component Value Date   LDLCALC 106 (H) 10/08/2016   Lab Results  Component Value Date   TRIG 81 10/08/2016   Lab Results  Component Value Date   CHOLHDL 3.6 10/08/2016   No results found for: HGBA1C    Assessment & Plan:   Problem List Items Addressed This Visit      Cardiovascular and Mediastinum   Migraine headache - Primary    Less frequent.  Just continue to use as needed Maxalt.  Follow-up 6 months.      Relevant Medications   meloxicam (MOBIC) 15 MG tablet   HYPERTENSION, BENIGN    Blood pressure looks fantastic today.  Continue current regimen.  Follow-up in 6 months.        Digestive   Oral herpes    Made sure valacyclovir had a refill on it.      Relevant Medications   valACYclovir (VALTREX) 1000 MG tablet   fluconazole (DIFLUCAN) 150 MG tablet     Other   Major depression, single episode    Happy with current regimen of Cymbalta and as needed alprazolam.  Continue current regimen.  Overall feels like she is doing pretty well.  Follow-up in 6 months.      Relevant Medications   ALPRAZolam (XANAX) 0.5 MG tablet      Reminded her to schedule her mammogram.  She also wanted to try something besides Aleve and ibuprofen for arthritis pain, as she is worried about potential for causing GI ulcers..  We discussed Mobic is a possibility is a Cox 2 inhibitor so should be a little bit more gentle on the stomach but still can  increase risk of GI ulcerations.  Meds ordered this encounter  Medications  . promethazine (PHENERGAN) 25 MG tablet    Sig: Take 1 tablet (25 mg total) by mouth every 4 (four) hours as needed for nausea or vomiting.    Dispense:  30 tablet    Refill:  6  . ALPRAZolam (XANAX) 0.5 MG tablet    Sig: TAKE 1 TABLET (0.5 MG TOTAL) BY MOUTH 2 (TWO) TIMES DAILY AS NEEDED.    Dispense:  45 tablet    Refill:  1  . valACYclovir (VALTREX) 1000 MG tablet    Sig: Take 1 tablet (1,000 mg total) by mouth 2 (two) times daily. When needed for outbreatk    Dispense:  30 tablet    Refill:  1  . fluconazole (DIFLUCAN) 150 MG tablet    Sig: Take 1 tablet (150 mg total) by mouth once for 1 dose.    Dispense:  1 tablet    Refill:  2  . meloxicam (MOBIC) 15 MG tablet    Sig: Take 1 tablet (15 mg total) by mouth daily.    Dispense:  30 tablet    Refill:  0    Follow-up: Return in about 6 months (around 03/15/2019)  for Hypertension.    Beatrice Lecher, MD

## 2018-09-12 NOTE — Assessment & Plan Note (Signed)
Less frequent.  Just continue to use as needed Maxalt.  Follow-up 6 months.

## 2018-09-13 ENCOUNTER — Other Ambulatory Visit: Payer: Self-pay

## 2018-09-13 ENCOUNTER — Encounter: Payer: Self-pay | Admitting: Family Medicine

## 2018-09-13 ENCOUNTER — Ambulatory Visit (INDEPENDENT_AMBULATORY_CARE_PROVIDER_SITE_OTHER): Payer: BC Managed Care – PPO | Admitting: Family Medicine

## 2018-09-13 VITALS — BP 131/81 | HR 68 | Temp 98.2°F | Wt 221.0 lb

## 2018-09-13 DIAGNOSIS — M25562 Pain in left knee: Secondary | ICD-10-CM

## 2018-09-13 NOTE — Progress Notes (Signed)
Kristen Spence is a 58 y.o. female who presents to [facility] Kristen Spence: Primary Care Sports Medicine today for recurrent left knee pain.  Patient has been seen several times for severe left knee pain.  She had MRI which showed complex medial meniscus tear as well as loose body and degenerative changes.  She is had trials of conventional intra-articular knee injection with little benefit.  She was seen on August 18 or limited musculoskeletal ultrasound targeted a bony prominence at the anterior distal femur with surrounding hypoechoic fluid thought to be a source of her pain.  She had injection in around this area with Marcaine and Depo-Medrol which provided significant pain relief.  She notes the pain returned yesterday.  She notes burning pain in the same location at the anterior medial knee.  She notes it is quite severe and interfering with walking.  She cannot recall any exacerbating factors or injury to make her knee pain returned.  She had been seen by surgery in July who wanted to proceed with trial of hyaluronic acid injections.  They prescribed Orthovisc or equivalent which should be available at the pharmacy soon.  Patient would like her injections to be done in this clinic.   ROS as above:  Exam:  BP 131/81    Pulse 68    Temp 98.2 F (36.8 C) (Oral)    Wt 221 lb (100.2 kg)    BMI 40.42 kg/m   Wt Readings from Last 5 Encounters:  09/13/18 221 lb (100.2 kg)  09/12/18 221 lb (100.2 kg)  08/30/18 220 lb (99.8 kg)  08/02/18 217 lb 14.4 oz (98.8 kg)  07/28/18 221 lb (100.2 kg)    Gen: Well NAD HEENT: EOMI,  MMM Lungs: Normal work of breathing. CTABL Heart: RRR no MRG Abd: NABS, Soft. Nondistended, Nontender Exts: Brisk capillary refill, warm and well perfused.  Left knee: Normal-appearing with no significant effusion or erythema. Range of motion 5-100 degrees. Tender to palpation at anterior  and more medially along the medial joint line. Stable ligamentous exam however patient guards with exam testing. Strength is intact. Antalgic gait.  Lab and Radiology Results Limited musculoskeletal ultrasound of left knee reveals similar appearing bony prominence with hypoechoic fluid at the anterior distal femur at joint line.  The meniscus in this area at the anterior to medial joint line is degenerative appearing with hypoechoic fluid.  Bony structures otherwise normal-appearing  Procedure: Real-time Ultrasound Guided Injection of anterior medial knee and interarticular left knee Device: GE Logiq E   Images permanently stored and available for review in the ultrasound unit. Verbal informed consent obtained.  Discussed risks and benefits of procedure. Warned about infection bleeding damage to structures skin hypopigmentation and fat atrophy among others. Patient expresses understanding and agreement Time-out conducted.   Noted no overlying erythema, induration, or other signs of local infection.   Skin prepped in a sterile fashion.   Local anesthesia: Topical Ethyl chloride.   With sterile technique and under real time ultrasound guidance:  40 mg of Depo-Medrol and 3 mL of Marcaine injected easily at the bony prominence at the anterior medial knee joint line and interarticular through the anterior medial joint. Completed without difficulty   Pain immediately resolved suggesting accurate placement of the medication.   Advised to call if fevers/chills, erythema, induration, drainage, or persistent bleeding.   Images permanently stored and available for review in the ultrasound unit.  Impression: Technically successful ultrasound guided injection.  Assessment and Plan: 58 y.o. female with significant left knee pain. Kristen Spence is in a bit of a bind right now.  She clearly has a lot of pain and dysfunction in her knee but does not have fantastic surgical options.  She has enough  degenerative changes that it is reasonable that arthroscopic debridement likely will not provide significant pain relief but she does not have enough arthritis that makes total knee replacement obviously necessary.  She already is in the process of getting a prescription for hyaluronic acid to be injected.  It is reasonable to have her schedule for hyaluronic acid injection trial in my clinic in near future.  However if no definitive benefit from any of these procedures at this point I think would be likely reasonable to consider surgical options.  PDMP reviewed during this encounter. No orders of the defined types were placed in this encounter.  No orders of the defined types were placed in this encounter.    Historical information moved to improve visibility of documentation.  Past Medical History:  Diagnosis Date   Benign positional vertigo    Dizziness    Edema    Fatigue    GERD (gastroesophageal reflux disease)    hiatal hernia   Heart palpitations    Hiatal hernia    Hypertension    Insomnia    Mental disorder    Migraine    Past Surgical History:  Procedure Laterality Date   BREAST BIOPSY     BREAST EXCISIONAL BIOPSY Left    BREAST LUMPECTOMY  2000    CESAREAN SECTION  1982   CHOLECYSTECTOMY  1987   ENDOMETRIAL ABLATION  09/30/2006   ESOPHAGOGASTRODUODENOSCOPY     Showed grade D. reflux esophagitis   TOTAL VAGINAL HYSTERECTOMY  09/27/2008   Dr. Lavon PaganiniStacey Reynolds   Social History   Tobacco Use   Smoking status: Never Smoker   Smokeless tobacco: Never Used  Substance Use Topics   Alcohol use: No   family history includes Alcohol abuse in her father; Breast cancer in her sister; COPD in her mother; Depression in her mother; Diabetes in her mother; Heart attack (age of onset: 4753) in her sister; Hyperlipidemia in her mother; Hypertension in her father and mother; Ovarian cancer in her mother; Stroke in her father.  Medications: Current  Outpatient Medications  Medication Sig Dispense Refill   ALPRAZolam (XANAX) 0.5 MG tablet TAKE 1 TABLET (0.5 MG TOTAL) BY MOUTH 2 (TWO) TIMES DAILY AS NEEDED. 45 tablet 1   cholecalciferol (VITAMIN D) 1000 units tablet Take 2,000 Units by mouth daily.     diclofenac sodium (VOLTAREN) 1 % GEL Apply 4 g topically 4 (four) times daily. To affected joint. 100 g 11   DULoxetine (CYMBALTA) 60 MG capsule Take 1 capsule (60 mg total) by mouth 2 (two) times daily. 180 capsule 1   gabapentin (NEURONTIN) 300 MG capsule One tab PO qHS for a week, then BID for a week, then TID. May double weekly to a max of 3,600mg /day 180 capsule 3   ipratropium (ATROVENT) 0.03 % nasal spray Place 2 sprays into both nostrils every 12 (twelve) hours. 30 mL 2   lisinopril-hydrochlorothiazide (ZESTORETIC) 20-25 MG tablet TAKE 1 TABLET BY MOUTH EVERY DAY 90 tablet 1   meloxicam (MOBIC) 15 MG tablet Take 1 tablet (15 mg total) by mouth daily. 30 tablet 0   montelukast (SINGULAIR) 10 MG tablet TAKE 1 TABLET BY MOUTH EVERYDAY AT BEDTIME 90 tablet 3   omeprazole (PRILOSEC) 20  MG capsule Take 1-2 capsules (20-40 mg total) by mouth daily. 180 capsule 3   promethazine (PHENERGAN) 25 MG tablet Take 1 tablet (25 mg total) by mouth every 4 (four) hours as needed for nausea or vomiting. 30 tablet 6   rizatriptan (MAXALT) 10 MG tablet Take 1 tablet (10 mg total) by mouth as needed. May repeat in 2 hours if needed 36 tablet 4   valACYclovir (VALTREX) 1000 MG tablet Take 1 tablet (1,000 mg total) by mouth 2 (two) times daily. When needed for outbreatk 30 tablet 1   No current facility-administered medications for this visit.    Allergies  Allergen Reactions   Banana Hives   Clonazepam Other (See Comments)    Makes her feel weird.      Discussed warning signs or symptoms. Please see discharge instructions. Patient expresses understanding.

## 2018-09-13 NOTE — Patient Instructions (Signed)
Thank you for coming in today. Once you have the gel shots schedule an appointment with me for injection.  Call or go to the ER if you develop a large red swollen joint with extreme pain or oozing puss.  Recheck as needed.  Let hope this shot helps.

## 2018-09-28 ENCOUNTER — Other Ambulatory Visit: Payer: Self-pay | Admitting: Family Medicine

## 2018-10-05 ENCOUNTER — Other Ambulatory Visit: Payer: Self-pay | Admitting: Family Medicine

## 2018-12-19 ENCOUNTER — Encounter: Payer: Self-pay | Admitting: Family Medicine

## 2018-12-19 ENCOUNTER — Ambulatory Visit (INDEPENDENT_AMBULATORY_CARE_PROVIDER_SITE_OTHER): Payer: BC Managed Care – PPO | Admitting: Family Medicine

## 2018-12-19 ENCOUNTER — Other Ambulatory Visit: Payer: Self-pay

## 2018-12-19 VITALS — BP 134/85 | HR 71 | Ht 62.0 in | Wt 221.0 lb

## 2018-12-19 DIAGNOSIS — N3000 Acute cystitis without hematuria: Secondary | ICD-10-CM | POA: Diagnosis not present

## 2018-12-19 DIAGNOSIS — M25562 Pain in left knee: Secondary | ICD-10-CM | POA: Diagnosis not present

## 2018-12-19 DIAGNOSIS — R3915 Urgency of urination: Secondary | ICD-10-CM | POA: Diagnosis not present

## 2018-12-19 LAB — POCT URINALYSIS DIP (CLINITEK)
Bilirubin, UA: NEGATIVE
Glucose, UA: NEGATIVE mg/dL
Ketones, POC UA: NEGATIVE mg/dL
Leukocytes, UA: NEGATIVE
Nitrite, UA: NEGATIVE
POC PROTEIN,UA: NEGATIVE
Spec Grav, UA: 1.025 (ref 1.010–1.025)
Urobilinogen, UA: 0.2 E.U./dL
pH, UA: 5.5 (ref 5.0–8.0)

## 2018-12-19 MED ORDER — SULFAMETHOXAZOLE-TRIMETHOPRIM 800-160 MG PO TABS: 1.0000 | ORAL_TABLET | Freq: Two times a day (BID) | ORAL | 0 refills | Status: DC

## 2018-12-19 MED ORDER — ALPRAZOLAM 0.5 MG PO TABS: ORAL_TABLET | ORAL | 1 refills | Status: DC

## 2018-12-19 MED ORDER — MELOXICAM 15 MG PO TABS: 15.0000 mg | ORAL_TABLET | Freq: Every day | ORAL | 1 refills | Status: DC

## 2018-12-19 NOTE — Progress Notes (Signed)
Acute Office Visit  Subjective:    Patient ID: Kristen Spence, female    DOB: [birth year], 58 y.o.   MRN: [id number]  Chief Complaint  Patient presents with  . Urinary Tract Infection    sxs x 1 wk. pt reports that she has urgency, and has some bilateral pelvic pain. she has been drinking more water, denies f/s/c/n/v/d and hasn't noticed any blood in her urine    HPI   Patient is in today for possible UTI.  Currently had symptoms for 1 week she has had some urgency as well as some bilateral pelvic pain.  She is been trying to really increase her water intake and has not noticed any blood in her urine.  She denies any fever, sweats, chills, nausea, vomiting or diarrhea.  No back pain.  No worsening or alleviating factors.  She and her husband had Covid earlier this year.  She says her husband has some permanent damage to his lungs and some neuropathy that has developed since then.  She also complains of persistent left knee pain.  She knows she has arthritis in the knee but says that it is been bothering her more particularly with prolonged standing she says when she stands for long.  She will get numbness and tingling from her knee all the way down.  She said she did see Dr. Georgina Snell at one point and he felt like it was not related to her spine.  Past Medical History:  Diagnosis Date  . Benign positional vertigo   . Dizziness   . Edema   . Fatigue   . GERD (gastroesophageal reflux disease)    hiatal hernia  . Heart palpitations   . Hiatal hernia   . Hypertension   . Insomnia   . Mental disorder   . Migraine     Past Surgical History:  Procedure Laterality Date  . BREAST BIOPSY    . BREAST EXCISIONAL BIOPSY Left   . BREAST LUMPECTOMY  2000   . [place]  . CHOLECYSTECTOMY  1987  . ENDOMETRIAL ABLATION  09/30/2006  . ESOPHAGOGASTRODUODENOSCOPY     Showed grade D. reflux esophagitis  . TOTAL VAGINAL HYSTERECTOMY  09/27/2008   Dr. Huntley Estelle    Family  History  Problem Relation Age of Onset  . Alcohol abuse Father   . Stroke Father   . Hypertension Father   . Ovarian cancer Mother   . Depression Mother   . Diabetes Mother   . Hyperlipidemia Mother   . Hypertension Mother   . COPD Mother   . Heart attack Sister 73  . Breast cancer Sister     Social History   Socioeconomic History  . Marital status: Married    Spouse name: bobby   . Number of children: 2  . Years of education: 10th grade  . Highest education level: Not on file  Occupational History  . Occupation:      Employer: [employer]  . Financial resource strain: Not on file  . Food insecurity    Worry: Not on file    Inability: Not on file  . Transportation needs    Medical: Not on file    Non-medical: Not on file  Tobacco Use  . Smoking status: Never Smoker  . Smokeless tobacco: Never Used  Substance and Sexual Activity  . Alcohol use: No  . Drug use: No  . Sexual activity: Never    Birth control/protection: Surgical  Lifestyle  . Physical activity    Days per week: Not on file    Minutes per session: Not on file  . Stress: Not on file  Relationships  . Social Musician on phone: Not on file    Gets together: Not on file    Attends religious service: Not on file    Active member of club or organization: Not on file    Attends meetings of clubs or organizations: Not on file    Relationship status: Not on file  . Intimate partner violence    Fear of current or ex partner: Not on file    Emotionally abused: Not on file    Physically abused: Not on file    Forced sexual activity: Not on file  Other Topics Concern  . Not on file  Social History Narrative   No regular exercise    Outpatient Medications Prior to Visit  Medication Sig Dispense Refill  . cholecalciferol (VITAMIN D) 1000 units tablet Take 2,000 Units by mouth daily.    . diclofenac sodium (VOLTAREN) 1 % GEL Apply 4 g topically 4 (four) times daily. To  affected joint. 100 g 11  . DULoxetine (CYMBALTA) 60 MG capsule TAKE 1 CAPSULE (60 MG TOTAL) BY MOUTH 2 (TWO) TIMES DAILY. 180 capsule 1  . gabapentin (NEURONTIN) 300 MG capsule One tab PO qHS for a week, then BID for a week, then TID. May double weekly to a max of 3,600mg /day 180 capsule 3  . ipratropium (ATROVENT) 0.03 % nasal spray Place 2 sprays into both nostrils every 12 (twelve) hours. 30 mL 2  . lisinopril-hydrochlorothiazide (ZESTORETIC) 20-25 MG tablet TAKE 1 TABLET BY MOUTH EVERY DAY 90 tablet 1  . omeprazole (PRILOSEC) 20 MG capsule Take 1-2 capsules (20-40 mg total) by mouth daily. 180 capsule 3  . promethazine (PHENERGAN) 25 MG tablet Take 1 tablet (25 mg total) by mouth every 4 (four) hours as needed for nausea or vomiting. 30 tablet 6  . rizatriptan (MAXALT) 10 MG tablet Take 1 tablet (10 mg total) by mouth as needed. May repeat in 2 hours if needed 36 tablet 4  . SINGULAIR 10 MG tablet Take 1 tablet by mouth at bedtime.    . valACYclovir (VALTREX) 1000 MG tablet Take 1 tablet (1,000 mg total) by mouth 2 (two) times daily. When needed for outbreatk 30 tablet 1  . ALPRAZolam (XANAX) 0.5 MG tablet TAKE 1 TABLET (0.5 MG TOTAL) BY MOUTH 2 (TWO) TIMES DAILY AS NEEDED. 45 tablet 1  . meloxicam (MOBIC) 15 MG tablet TAKE 1 TABLET BY MOUTH EVERY DAY 30 tablet 0  . montelukast (SINGULAIR) 10 MG tablet TAKE 1 TABLET BY MOUTH EVERYDAY AT BEDTIME 90 tablet 3   No facility-administered medications prior to visit.     Allergies  Allergen Reactions  . Banana Hives  . Clonazepam Other (See Comments)    Makes her feel weird.     ROS     Objective:    Physical Exam  Constitutional: She is oriented to person, place, and time. She appears well-developed and well-nourished.  HENT:  Head: Normocephalic and atraumatic.  Cardiovascular: Normal rate, regular rhythm and normal heart sounds.  Pulmonary/Chest: Effort normal and breath sounds normal.  Neurological: She is alert and oriented to  person, place, and time.  Skin: Skin is warm and dry.  Psychiatric: She has a normal mood and affect. Her behavior is normal.    BP 134/85   Pulse  71   Ht 5\' 2"  (1.575 m)   Wt 221 lb (100.2 kg)   SpO2 98%   BMI 40.42 kg/m  Wt Readings from Last 3 Encounters:  12/19/18 221 lb (100.2 kg)  09/13/18 221 lb (100.2 kg)  09/12/18 221 lb (100.2 kg)    Health Maintenance Due  Topic Date Due  . Hepatitis C Screening  07-15-1960  . HIV Screening  12/15/1976  . COLONOSCOPY  02/20/2015  . TETANUS/TDAP  01/20/2016  . MAMMOGRAM  11/03/2017    There are no preventive care reminders to display for this patient.   Lab Results  Component Value Date   TSH 2.05 04/19/2018   Lab Results  Component Value Date   WBC 5.0 04/19/2018   HGB 13.9 04/19/2018   HCT 40.3 04/19/2018   MCV 84.8 04/19/2018   PLT 233 04/19/2018   Lab Results  Component Value Date   NA 140 04/19/2018   K 3.8 04/19/2018   CO2 27 04/19/2018   GLUCOSE 92 04/19/2018   BUN 18 04/19/2018   CREATININE 1.08 (H) 04/19/2018   BILITOT 0.5 04/19/2018   ALKPHOS 74 11/01/2015   AST 25 04/19/2018   ALT 22 04/19/2018   PROT 7.1 04/19/2018   ALBUMIN 4.1 11/01/2015   CALCIUM 8.8 04/19/2018   Lab Results  Component Value Date   CHOL 170 10/08/2016   Lab Results  Component Value Date   HDL 47 (L) 10/08/2016   Lab Results  Component Value Date   LDLCALC 106 (H) 10/08/2016   Lab Results  Component Value Date   TRIG 81 10/08/2016   Lab Results  Component Value Date   CHOLHDL 3.6 10/08/2016   No results found for: HGBA1C     Assessment & Plan:   Problem List Items Addressed This Visit    None    Visit Diagnoses    Urinary urgency    -  Primary   Relevant Orders   POCT URINALYSIS DIP (CLINITEK) (Completed)   Acute pain of left knee       Acute cystitis without hematuria       Relevant Orders   Urine Culture     UTI - will tx with Bactrim.  Call if symptoms not improving or resolving.  Left knee  pain-patient had MRI of the left knee in July showing tricompartmental osteoarthritis and complex degeneration of the posterior horn of the medial meniscus.  Also some loose bodies.  Also some bursitis.  Unclear etiology of why she is getting numbness from the knee down.  This is a little unusual.  Offered to refer her to Ortho.  She says she will keep an eye on it let me know but would like a refill of meloxicam for now.   Meds ordered this encounter  Medications  . ALPRAZolam (XANAX) 0.5 MG tablet    Sig: TAKE 1 TABLET (0.5 MG TOTAL) BY MOUTH 2 (TWO) TIMES DAILY AS NEEDED.    Dispense:  45 tablet    Refill:  1  . meloxicam (MOBIC) 15 MG tablet    Sig: Take 1 tablet (15 mg total) by mouth daily. PRN    Dispense:  90 tablet    Refill:  1  . sulfamethoxazole-trimethoprim (BACTRIM DS) 800-160 MG tablet    Sig: Take 1 tablet by mouth 2 (two) times daily.    Dispense:  6 tablet    Refill:  0     Nani Gasseratherine Metheney, MD

## 2018-12-21 LAB — URINE CULTURE
MICRO NUMBER:: 1146182
SPECIMEN QUALITY:: ADEQUATE

## 2019-02-09 ENCOUNTER — Other Ambulatory Visit: Payer: Self-pay | Admitting: Family Medicine

## 2019-03-21 ENCOUNTER — Telehealth: Payer: Self-pay

## 2019-03-21 MED ORDER — GABAPENTIN 300 MG PO CAPS: 600.0000 mg | ORAL_CAPSULE | Freq: Three times a day (TID) | ORAL | 3 refills | Status: DC

## 2019-03-21 NOTE — Telephone Encounter (Signed)
Sorry Dr Denyse Amass. I meant to send if to Dr Linford Arnold. Thanks for your time!!

## 2019-03-21 NOTE — Telephone Encounter (Signed)
I did go ahead and refill it because I think I filled it way back.  Sorry

## 2019-03-21 NOTE — Telephone Encounter (Signed)
Kristen Spence called and left a message stating she has increased the gabapentin to 2- tablets of the 300 mg TID as directed by Dr Denyse Amass. She needs a new prescription. Pharmacy will not fill until the directions are changed. Please advise.

## 2019-03-21 NOTE — Telephone Encounter (Signed)
Gabapentin prescription written at the dose she is taking 2 300 mg capsules 3 times daily.  Prescription sent to pharmacy with 90-day supply with refills.

## 2019-04-12 ENCOUNTER — Other Ambulatory Visit: Payer: Self-pay | Admitting: Family Medicine

## 2019-04-13 ENCOUNTER — Other Ambulatory Visit: Payer: Self-pay | Admitting: Family Medicine

## 2019-04-13 ENCOUNTER — Encounter: Payer: Self-pay | Admitting: Family Medicine

## 2019-04-13 ENCOUNTER — Telehealth (INDEPENDENT_AMBULATORY_CARE_PROVIDER_SITE_OTHER): Payer: BC Managed Care – PPO | Admitting: Family Medicine

## 2019-04-13 VITALS — BP 128/88 | Temp 97.6°F | Ht 62.0 in | Wt 221.0 lb

## 2019-04-13 DIAGNOSIS — J0100 Acute maxillary sinusitis, unspecified: Secondary | ICD-10-CM | POA: Diagnosis not present

## 2019-04-13 DIAGNOSIS — G43109 Migraine with aura, not intractable, without status migrainosus: Secondary | ICD-10-CM | POA: Diagnosis not present

## 2019-04-13 DIAGNOSIS — J301 Allergic rhinitis due to pollen: Secondary | ICD-10-CM | POA: Diagnosis not present

## 2019-04-13 MED ORDER — AMOXICILLIN-POT CLAVULANATE 875-125 MG PO TABS: 1.0000 | ORAL_TABLET | Freq: Two times a day (BID) | ORAL | 0 refills | Status: DC

## 2019-04-13 MED ORDER — DULOXETINE HCL 60 MG PO CPEP: 60.0000 mg | ORAL_CAPSULE | Freq: Two times a day (BID) | ORAL | 1 refills | Status: DC

## 2019-04-13 MED ORDER — MOMETASONE FUROATE 50 MCG/ACT NA SUSP: 2.0000 | Freq: Every day | NASAL | 3 refills | Status: DC

## 2019-04-13 MED ORDER — RIZATRIPTAN BENZOATE 10 MG PO TABS: 10.0000 mg | ORAL_TABLET | ORAL | 4 refills | Status: DC | PRN

## 2019-04-13 NOTE — Progress Notes (Signed)
Virtual Visit via Video Note  I connected with Kristen Spence on 04/13/19 at 10:50 AM EDT by a video enabled telemedicine application and verified that I am speaking with the correct person using two identifiers.   I discussed the limitations of evaluation and management by telemedicine and the availability of in person appointments. The patient expressed understanding and agreed to proceed.  Subjective:    CC: Sinus issue.   HPI: She reports that she has been experiencing allergy issues for the pas 3-4 weeks. She was having clear drainage this has since changed over to a yellow drainage. She began taking Zyrtec about 2 weeks ago ( had taken claritin befor that).  and sudafed PE these helped her some.  She just has a lot of constant postnasal drip last week had a very sore throat but that part is actually a little bit better currently.  She says she never ran a fever.  He has been sneezing a lot.  If she gets outside at all it ramps up her symptoms.  She continues to have watery eyes, and in her R cheek she feels pressure and right frontal headache, L ear pressure and R ear has been popping.   Denies f/s/c/n/v/d.   Past medical history, Surgical history, Family history not pertinant except as noted below, Social history, Allergies, and medications have been entered into the medical record, reviewed, and corrections made.   Review of Systems: No fevers, chills, night sweats, weight loss, chest pain, or shortness of breath.   Objective:    General: Speaking clearly in complete sentences without any shortness of breath.  Alert and oriented x3.  Normal judgment. No apparent acute distress.    Impression and Recommendations:    No problem-specific Assessment & Plan notes found for this encounter.  Allergic rhinitis-we will add a nasal steroid spray to her oral Zyrtec.  She said she had tried Flonase in the past and felt like it really was not very helpful so written a try Nasonex  instead new prescription sent to pharmacy but did encourage her to try it for at least a couple of weeks sometimes it can take 3 to 4 days to start showing and affect.  Acute left maxillary sinusitis-we will treat with Augmentin.  Call if not significantly better in 1 week.  Consider consider addition of prednisone if needed.   Time spent in encounter 21 minutes  I discussed the assessment and treatment plan with the patient. The patient was provided an opportunity to ask questions and all were answered. The patient agreed with the plan and demonstrated an understanding of the instructions.   The patient was advised to call back or seek an in-person evaluation if the symptoms worsen or if the condition fails to improve as anticipated.   Nani Gasser, MD

## 2019-04-13 NOTE — Progress Notes (Signed)
She reports that she has been experiencing allergy issues for the pas 3-4 weeks. She was having clear drainage this has since changed over to a yellow drainage. She began taking Zyrtec and sudafed PE these helped her some.   She continues to have watery eyes and her R cheek she feels pressure and headache, L ear pressure and R ear has been popping.   Denies f/s/c/n/v/d.

## 2019-06-13 ENCOUNTER — Other Ambulatory Visit: Payer: Self-pay | Admitting: Family Medicine

## 2019-06-23 ENCOUNTER — Other Ambulatory Visit: Payer: Self-pay | Admitting: Family Medicine

## 2019-06-23 DIAGNOSIS — K219 Gastro-esophageal reflux disease without esophagitis: Secondary | ICD-10-CM

## 2019-07-05 ENCOUNTER — Other Ambulatory Visit: Payer: Self-pay | Admitting: *Deleted

## 2019-07-05 MED ORDER — IPRATROPIUM BROMIDE 0.03 % NA SOLN: 2.0000 | Freq: Two times a day (BID) | NASAL | 2 refills | Status: DC

## 2019-08-31 ENCOUNTER — Ambulatory Visit (INDEPENDENT_AMBULATORY_CARE_PROVIDER_SITE_OTHER): Payer: BC Managed Care – PPO

## 2019-08-31 ENCOUNTER — Other Ambulatory Visit: Payer: Self-pay

## 2019-08-31 ENCOUNTER — Other Ambulatory Visit: Payer: Self-pay | Admitting: Family Medicine

## 2019-08-31 DIAGNOSIS — Z1231 Encounter for screening mammogram for malignant neoplasm of breast: Secondary | ICD-10-CM

## 2019-10-05 ENCOUNTER — Ambulatory Visit (INDEPENDENT_AMBULATORY_CARE_PROVIDER_SITE_OTHER): Payer: BC Managed Care – PPO | Admitting: Family Medicine

## 2019-10-05 ENCOUNTER — Encounter: Payer: Self-pay | Admitting: Family Medicine

## 2019-10-05 VITALS — BP 121/67 | HR 70 | Ht 62.0 in | Wt 231.0 lb

## 2019-10-05 DIAGNOSIS — G43109 Migraine with aura, not intractable, without status migrainosus: Secondary | ICD-10-CM | POA: Diagnosis not present

## 2019-10-05 DIAGNOSIS — F324 Major depressive disorder, single episode, in partial remission: Secondary | ICD-10-CM | POA: Diagnosis not present

## 2019-10-05 DIAGNOSIS — I10 Essential (primary) hypertension: Secondary | ICD-10-CM

## 2019-10-05 DIAGNOSIS — F411 Generalized anxiety disorder: Secondary | ICD-10-CM

## 2019-10-05 DIAGNOSIS — K219 Gastro-esophageal reflux disease without esophagitis: Secondary | ICD-10-CM

## 2019-10-05 DIAGNOSIS — B002 Herpesviral gingivostomatitis and pharyngotonsillitis: Secondary | ICD-10-CM

## 2019-10-05 DIAGNOSIS — M79605 Pain in left leg: Secondary | ICD-10-CM | POA: Insufficient documentation

## 2019-10-05 MED ORDER — OMEPRAZOLE 20 MG PO CPDR: 20.0000 mg | DELAYED_RELEASE_CAPSULE | Freq: Every day | ORAL | 3 refills | Status: DC

## 2019-10-05 MED ORDER — VALACYCLOVIR HCL 1 G PO TABS: 1000.0000 mg | ORAL_TABLET | Freq: Two times a day (BID) | ORAL | 1 refills | Status: DC

## 2019-10-05 MED ORDER — PROMETHAZINE HCL 25 MG PO TABS: 25.0000 mg | ORAL_TABLET | ORAL | 6 refills | Status: DC | PRN

## 2019-10-05 MED ORDER — LISINOPRIL-HYDROCHLOROTHIAZIDE 20-25 MG PO TABS: 1.0000 | ORAL_TABLET | Freq: Every day | ORAL | 1 refills | Status: DC

## 2019-10-05 MED ORDER — DULOXETINE HCL 60 MG PO CPEP: 60.0000 mg | ORAL_CAPSULE | Freq: Two times a day (BID) | ORAL | 1 refills | Status: DC

## 2019-10-05 MED ORDER — RIZATRIPTAN BENZOATE 10 MG PO TABS: 10.0000 mg | ORAL_TABLET | ORAL | 4 refills | Status: DC | PRN

## 2019-10-05 MED ORDER — ALPRAZOLAM 0.5 MG PO TABS: 0.5000 mg | ORAL_TABLET | Freq: Every day | ORAL | 0 refills | Status: DC | PRN

## 2019-10-05 NOTE — Assessment & Plan Note (Addendum)
Well controlled. Continue current regimen. Follow up in  41mo.  Refilled Maxalt today.

## 2019-10-05 NOTE — Progress Notes (Signed)
Established Patient Office Visit  Subjective:  Patient ID: Kristen Spence, female    DOB: January 13, 1961  Age: 59 y.o. MRN: [id number]  CC:  Chief Complaint  Patient presents with  . Hypertension    HPI Kristen Spence presents for   Hypertension- Pt denies chest pain, SOB, dizziness, or heart palpitations.  Taking meds as directed w/o problems.  Denies medication side effects.     F/U Migraines -overall she is doing well with her migraines.  She does like stress is a big trigger for her so sometimes that will cause them but she takes her Maxalt and says most the time it actually works really well.  Also requesting a refill on her valacyclovir.  Follow-up major depressive disorder and panic disorder-she has been under a lot of stress recently with her husband's health ever since he got Covid he has not been well and has not been able to function.  He has been on oxygen and more recently has been diagnosed with possible multiple myeloma they are still in the middle of a work-up so this is just been surprising and overwhelming for her.  Also, she was previously seeing one of our sports med docs for left knee and left lower leg pain.  She says it gets swelling from time to time and it actually will cause some numbness and tingling and sometimes pain from the knee down all the way to her foot.  She was seeing Dr. Georgina Snell for this and he eventually put her on gabapentin she says it does seem to help sometimes will take an extra capsule if needed and she does need a refill on that today.  She does not wear compression stockings.   Past Medical History:  Diagnosis Date  . Benign positional vertigo   . Dizziness   . Edema   . Fatigue   . GERD (gastroesophageal reflux disease)    hiatal hernia  . Heart palpitations   . Hiatal hernia   . Hypertension   . Insomnia   . Mental disorder   . Migraine     Past Surgical History:  Procedure Laterality Date  . BREAST BIOPSY    . BREAST  EXCISIONAL BIOPSY Left   . BREAST LUMPECTOMY  2000   . [place]  . CHOLECYSTECTOMY  1987  . ENDOMETRIAL ABLATION  09/30/2006  . ESOPHAGOGASTRODUODENOSCOPY     Showed grade D. reflux esophagitis  . TOTAL VAGINAL HYSTERECTOMY  09/27/2008   Dr. Huntley Estelle    Family History  Problem Relation Age of Onset  . Alcohol abuse Father   . Stroke Father   . Hypertension Father   . Ovarian cancer Mother   . Depression Mother   . Diabetes Mother   . Hyperlipidemia Mother   . Hypertension Mother   . COPD Mother   . Heart attack Sister 90  . Breast cancer Sister     Social History   Socioeconomic History  . Marital status: Married    Spouse name: bobby   . Number of children: 2  . Years of education: 10th grade  . Highest education level: Not on file  Occupational History  . Occupation:      Employer: [employer]  Tobacco Use  . Smoking status: Never Smoker  . Smokeless tobacco: Never Used  Substance and Sexual Activity  . Alcohol use: No  . Drug use: No  . Sexual activity: Never    Birth control/protection: Surgical  Other  Topics Concern  . Not on file  Social History Narrative   No regular exercise   Social Determinants of Health   Financial Resource Strain:   . Difficulty of Paying Living Expenses: Not on file  Food Insecurity:   . Worried About Charity fundraiser in the Last Year: Not on file  . Ran Out of Food in the Last Year: Not on file  Transportation Needs:   . Lack of Transportation (Medical): Not on file  . Lack of Transportation (Non-Medical): Not on file  Physical Activity:   . Days of Exercise per Week: Not on file  . Minutes of Exercise per Session: Not on file  Stress:   . Feeling of Stress : Not on file  Social Connections:   . Frequency of Communication with Friends and Family: Not on file  . Frequency of Social Gatherings with Friends and Family: Not on file  . Attends Religious Services: Not on file  . Active Member of  Clubs or Organizations: Not on file  . Attends Archivist Meetings: Not on file  . Marital Status: Not on file  Intimate Partner Violence:   . Fear of Current or Ex-Partner: Not on file  . Emotionally Abused: Not on file  . Physically Abused: Not on file  . Sexually Abused: Not on file    Outpatient Medications Prior to Visit  Medication Sig Dispense Refill  . cholecalciferol (VITAMIN D) 1000 units tablet Take 2,000 Units by mouth daily.    . diclofenac sodium (VOLTAREN) 1 % GEL Apply 4 g topically 4 (four) times daily. To affected joint. 100 g 11  . gabapentin (NEURONTIN) 300 MG capsule Take 2 capsules (600 mg total) by mouth 3 (three) times daily. 540 capsule 3  . ipratropium (ATROVENT) 0.03 % nasal spray Place 2 sprays into both nostrils every 12 (twelve) hours. 30 mL 2  . meloxicam (MOBIC) 15 MG tablet TAKE 1 TABLET (15 MG TOTAL) BY MOUTH DAILY. PRN 90 tablet 1  . mometasone (NASONEX) 50 MCG/ACT nasal spray Place 2 sprays into the nose daily. 17 g 3  . montelukast (SINGULAIR) 10 MG tablet TAKE 1 TABLET BY MOUTH EVERYDAY AT BEDTIME 90 tablet 3  . DULoxetine (CYMBALTA) 60 MG capsule Take 1 capsule (60 mg total) by mouth 2 (two) times daily. 180 capsule 1  . lisinopril-hydrochlorothiazide (ZESTORETIC) 20-25 MG tablet TAKE 1 TABLET BY MOUTH EVERY DAY 90 tablet 1  . omeprazole (PRILOSEC) 20 MG capsule Take 1-2 capsules (20-40 mg total) by mouth daily. **PATIENT NEEDS OFFICE VISIT FOR ADDITIONAL REFILLS** 180 capsule 0  . promethazine (PHENERGAN) 25 MG tablet Take 1 tablet (25 mg total) by mouth every 4 (four) hours as needed for nausea or vomiting. 30 tablet 6  . rizatriptan (MAXALT) 10 MG tablet Take 1 tablet (10 mg total) by mouth as needed. May repeat in 2 hours if needed 36 tablet 4  . valACYclovir (VALTREX) 1000 MG tablet Take 1 tablet (1,000 mg total) by mouth 2 (two) times daily. When needed for outbreatk 30 tablet 1  . amoxicillin-clavulanate (AUGMENTIN) 875-125 MG tablet  Take 1 tablet by mouth 2 (two) times daily. 20 tablet 0   No facility-administered medications prior to visit.    Allergies  Allergen Reactions  . Banana Hives  . Clonazepam Other (See Comments)    Makes her feel weird.     ROS Review of Systems    Objective:    Physical Exam Constitutional:  Appearance: She is well-developed.  HENT:     Head: Normocephalic and atraumatic.  Cardiovascular:     Rate and Rhythm: Normal rate and regular rhythm.     Heart sounds: Normal heart sounds.  Pulmonary:     Effort: Pulmonary effort is normal.     Breath sounds: Normal breath sounds.  Musculoskeletal:     Comments: Trace swelling in the left lower leg.  Skin:    General: Skin is warm and dry.  Neurological:     Mental Status: She is alert and oriented to person, place, and time.  Psychiatric:        Behavior: Behavior normal.     BP 121/67   Pulse 70   Ht _0  (1.575 m)   Wt 231 lb (104.8 kg)   SpO2 97%   BMI 42.25 kg/m  Wt Readings from Last 3 Encounters:  10/05/19 231 lb (104.8 kg)  04/13/19 221 lb (100.2 kg)  12/19/18 221 lb (100.2 kg)     Health Maintenance Due  Topic Date Due  . COVID-19 Vaccine (1) Never done    There are no preventive care reminders to display for this patient.  Lab Results  Component Value Date   TSH 2.05 04/19/2018   Lab Results  Component Value Date   WBC 5.0 04/19/2018   HGB 13.9 04/19/2018   HCT 40.3 04/19/2018   MCV 84.8 04/19/2018   PLT 233 04/19/2018   Lab Results  Component Value Date   NA 140 04/19/2018   K 3.8 04/19/2018   CO2 27 04/19/2018   GLUCOSE 92 04/19/2018   BUN 18 04/19/2018   CREATININE 1.08 (H) 04/19/2018   BILITOT 0.5 04/19/2018   ALKPHOS 74 11/01/2015   AST 25 04/19/2018   ALT 22 04/19/2018   PROT 7.1 04/19/2018   ALBUMIN 4.1 11/01/2015   CALCIUM 8.8 04/19/2018   Lab Results  Component Value Date   CHOL 170 10/08/2016   Lab Results  Component Value Date   HDL 47 (L) 10/08/2016    Lab Results  Component Value Date   LDLCALC 106 (H) 10/08/2016   Lab Results  Component Value Date   TRIG 81 10/08/2016   Lab Results  Component Value Date   CHOLHDL 3.6 10/08/2016   No results found for: HGBA1C    Assessment & Plan:   Problem List Items Addressed This Visit      Cardiovascular and Mediastinum   Migraine headache    Well controlled. Continue current regimen. Follow up in  54mo  Refilled Maxalt today.      Relevant Medications   DULoxetine (CYMBALTA) 60 MG capsule   lisinopril-hydrochlorothiazide (ZESTORETIC) 20-25 MG tablet   rizatriptan (MAXALT) 10 MG tablet   Other Relevant Orders   CBC   COMPLETE METABOLIC PANEL WITH GFR   Lipid panel   TSH   Hemoglobin A1c   HYPERTENSION, BENIGN - Primary    Well controlled. Continue current regimen. Follow up in  6 months.       Relevant Medications   lisinopril-hydrochlorothiazide (ZESTORETIC) 20-25 MG tablet   Other Relevant Orders   CBC   COMPLETE METABOLIC PANEL WITH GFR   Lipid panel   TSH   Hemoglobin A1c     Digestive   Oral herpes    RF valatrex      Relevant Medications   valACYclovir (VALTREX) 1000 MG tablet   Gastroesophageal reflux disease   Relevant Medications   omeprazole (PRILOSEC) 20 MG capsule  Other   Major depression, single episode    Feels like she is doing well on her duloxetine.  Continue current regimen.  Follow-up in 6 months.  She would like a refill on the alprazolam as well she says she uses it very sparingly she uses that more for her anxiety.      Relevant Medications   ALPRAZolam (XANAX) 0.5 MG tablet   DULoxetine (CYMBALTA) 60 MG capsule   Left leg pain    Current regimen seems to be helpful.  She should have enough refills until next March in which case I am happy to take over that prescription.  Did encourage her to consider wearing compression stockings to help control the swelling.      Generalized anxiety disorder    Refill alprazolam.  Use very  sparingly.      Relevant Medications   ALPRAZolam (XANAX) 0.5 MG tablet   DULoxetine (CYMBALTA) 60 MG capsule      Meds ordered this encounter  Medications  . ALPRAZolam (XANAX) 0.5 MG tablet    Sig: Take 1 tablet (0.5 mg total) by mouth daily as needed for anxiety.    Dispense:  30 tablet    Refill:  0  . DULoxetine (CYMBALTA) 60 MG capsule    Sig: Take 1 capsule (60 mg total) by mouth 2 (two) times daily.    Dispense:  180 capsule    Refill:  1  . lisinopril-hydrochlorothiazide (ZESTORETIC) 20-25 MG tablet    Sig: Take 1 tablet by mouth daily.    Dispense:  90 tablet    Refill:  1  . valACYclovir (VALTREX) 1000 MG tablet    Sig: Take 1 tablet (1,000 mg total) by mouth 2 (two) times daily. When needed for outbreatk    Dispense:  30 tablet    Refill:  1  . rizatriptan (MAXALT) 10 MG tablet    Sig: Take 1 tablet (10 mg total) by mouth as needed. May repeat in 2 hours if needed    Dispense:  36 tablet    Refill:  4  . DISCONTD: promethazine (PHENERGAN) 25 MG tablet    Sig: Take 1 tablet (25 mg total) by mouth every 4 (four) hours as needed for nausea or vomiting.    Dispense:  30 tablet    Refill:  6  . omeprazole (PRILOSEC) 20 MG capsule    Sig: Take 1-2 capsules (20-40 mg total) by mouth daily.    Dispense:  180 capsule    Refill:  3    Follow-up: Return in about 6 months (around 04/03/2020) for Hypertension and mood.    Beatrice Lecher, MD

## 2019-10-05 NOTE — Assessment & Plan Note (Signed)
Refill alprazolam.  Use very sparingly.

## 2019-10-05 NOTE — Assessment & Plan Note (Signed)
Feels like she is doing well on her duloxetine.  Continue current regimen.  Follow-up in 6 months.  She would like a refill on the alprazolam as well she says she uses it very sparingly she uses that more for her anxiety.

## 2019-10-05 NOTE — Assessment & Plan Note (Signed)
Well controlled. Continue current regimen. Follow up in  6 months.  

## 2019-10-06 NOTE — Assessment & Plan Note (Signed)
RF valatrex

## 2019-10-06 NOTE — Assessment & Plan Note (Addendum)
Current regimen seems to be helpful.  She should have enough refills until next March in which case I am happy to take over that prescription.  Did encourage her to consider wearing compression stockings to help control the swelling.

## 2019-10-09 DIAGNOSIS — I10 Essential (primary) hypertension: Secondary | ICD-10-CM | POA: Diagnosis not present

## 2019-10-09 DIAGNOSIS — B079 Viral wart, unspecified: Secondary | ICD-10-CM | POA: Diagnosis not present

## 2019-10-09 DIAGNOSIS — L820 Inflamed seborrheic keratosis: Secondary | ICD-10-CM | POA: Diagnosis not present

## 2019-10-09 DIAGNOSIS — D225 Melanocytic nevi of trunk: Secondary | ICD-10-CM | POA: Diagnosis not present

## 2019-10-09 DIAGNOSIS — G43109 Migraine with aura, not intractable, without status migrainosus: Secondary | ICD-10-CM | POA: Diagnosis not present

## 2019-10-09 DIAGNOSIS — B001 Herpesviral vesicular dermatitis: Secondary | ICD-10-CM | POA: Diagnosis not present

## 2019-10-10 LAB — COMPLETE METABOLIC PANEL WITH GFR
AG Ratio: 1.4 (calc) (ref 1.0–2.5)
ALT: 20 U/L (ref 6–29)
AST: 15 U/L (ref 10–35)
Albumin: 3.8 g/dL (ref 3.6–5.1)
Alkaline phosphatase (APISO): 60 U/L (ref 37–153)
BUN: 17 mg/dL (ref 7–25)
CO2: 26 mmol/L (ref 20–32)
Calcium: 9.1 mg/dL (ref 8.6–10.4)
Chloride: 109 mmol/L (ref 98–110)
Creat: 0.97 mg/dL (ref 0.50–1.05)
GFR, Est African American: 75 mL/min/{1.73_m2} (ref >=60)
GFR, Est Non African American: 65 mL/min/{1.73_m2} (ref >=60)
Globulin: 2.8 g/dL (calc) (ref 1.9–3.7)
Glucose, Bld: 94 mg/dL (ref 65–99)
Potassium: 4.2 mmol/L (ref 3.5–5.3)
Sodium: 142 mmol/L (ref 135–146)
Total Bilirubin: 0.4 mg/dL (ref 0.2–1.2)
Total Protein: 6.6 g/dL (ref 6.1–8.1)

## 2019-10-10 LAB — CBC
HCT: 36.1 % (ref 35.0–45.0)
Hemoglobin: 12.1 g/dL (ref 11.7–15.5)
MCH: 29.7 pg (ref 27.0–33.0)
MCHC: 33.5 g/dL (ref 32.0–36.0)
MCV: 88.7 fL (ref 80.0–100.0)
MPV: 9.9 fL (ref 7.5–12.5)
Platelets: 318 10*3/uL (ref 140–400)
RBC: 4.07 10*6/uL (ref 3.80–5.10)
RDW: 13.1 % (ref 11.0–15.0)
WBC: 8.2 10*3/uL (ref 3.8–10.8)

## 2019-10-10 LAB — LIPID PANEL
Cholesterol: 154 mg/dL (ref <200)
HDL: 41 mg/dL — ABNORMAL LOW (ref >=50)
LDL Cholesterol (Calc): 93 mg/dL (calc)
Non-HDL Cholesterol (Calc): 113 mg/dL (calc) (ref <130)
Total CHOL/HDL Ratio: 3.8 (calc) (ref <5.0)
Triglycerides: 102 mg/dL (ref <150)

## 2019-10-10 LAB — TSH: TSH: 2.74 mIU/L (ref 0.40–4.50)

## 2019-10-10 LAB — HEMOGLOBIN A1C
Hgb A1c MFr Bld: 5.2 % of total Hgb (ref <5.7)
Mean Plasma Glucose: 103 (calc)
eAG (mmol/L): 5.7 (calc)

## 2019-11-07 DIAGNOSIS — R21 Rash and other nonspecific skin eruption: Secondary | ICD-10-CM | POA: Diagnosis not present

## 2019-11-07 DIAGNOSIS — D485 Neoplasm of uncertain behavior of skin: Secondary | ICD-10-CM | POA: Diagnosis not present

## 2019-11-07 DIAGNOSIS — L931 Subacute cutaneous lupus erythematosus: Secondary | ICD-10-CM | POA: Diagnosis not present

## 2019-11-16 ENCOUNTER — Telehealth (INDEPENDENT_AMBULATORY_CARE_PROVIDER_SITE_OTHER): Payer: BC Managed Care – PPO | Admitting: Family Medicine

## 2019-11-16 ENCOUNTER — Encounter: Payer: Self-pay | Admitting: Family Medicine

## 2019-11-16 DIAGNOSIS — G4709 Other insomnia: Secondary | ICD-10-CM

## 2019-11-16 DIAGNOSIS — F4321 Adjustment disorder with depressed mood: Secondary | ICD-10-CM

## 2019-11-16 MED ORDER — ZOLPIDEM TARTRATE 5 MG PO TABS: 5.0000 mg | ORAL_TABLET | Freq: Every evening | ORAL | 1 refills | Status: DC | PRN

## 2019-11-16 NOTE — Progress Notes (Signed)
Virtual Visit via Telephone Note  I connected with Kristen Spence on 11/16/19 at  1:20 PM EDT by telephone and verified that I am speaking with the correct person using two identifiers.   I discussed the limitations, risks, security and privacy concerns of performing an evaluation and management service by telephone and the availability of in person appointments. I also discussed with the patient that there may be a patient responsible charge related to this service. The patient expressed understanding and agreed to proceed.  Patient location: at home  Provider loccation: In office   Subjective:    CC: Grief  HPI: Patient called today distressed.  Her husband passed away suddenly on 27-May-2022 night.  He had actually been to several doctors appointments that week including the pulmonologist.  Evidently he had sudden onset of flash pulmonary edema and shortness of breath and passed very quickly. She is very distraught. She is barely eating or sleeping. She just feels completely overwhelmed.  Her son is coming to check on her daily.     Past medical history, Surgical history, Family history not pertinant except as noted below, Social history, Allergies, and medications have been entered into the medical record, reviewed, and corrections made.   Review of Systems: No fevers, chills, night sweats, weight loss, chest pain, or shortness of breath.   Objective:    General: Speaking clearly in complete sentences without any shortness of breath.  Alert and oriented x3.  Normal judgment. No apparent acute distress.    Impression and Recommendations:    Grief Reassured patient that I do not think there is anything different that she could have done possibly to change the outcome.  Did discuss getting her in with a grief counselor and may be even working on getting her some sleep. Trial of Ambien to use prn.  Did encourage her to try to follow-up in the next couple of weeks if possible.  She says  she will call back and let me know.   Other insomnia Trial of Ambien for prn use for short term.         I discussed the assessment and treatment plan with the patient. The patient was provided an opportunity to ask questions and all were answered. The patient agreed with the plan and demonstrated an understanding of the instructions.   The patient was advised to call back or seek an in-person evaluation if the symptoms worsen or if the condition fails to improve as anticipated.  I provided 22 minutes of non-face-to-face time during this encounter.   Nani Gasser, MD

## 2019-11-16 NOTE — Assessment & Plan Note (Signed)
Reassured patient that I do not think there is anything different that she could have done possibly to change the outcome.  Did discuss getting her in with a grief counselor and may be even working on getting her some sleep. Trial of Ambien to use prn.  Did encourage her to try to follow-up in the next couple of weeks if possible.  She says she will call back and let me know.

## 2019-11-16 NOTE — Assessment & Plan Note (Signed)
Trial of Ambien for prn use for short term.

## 2019-11-17 ENCOUNTER — Ambulatory Visit: Payer: BC Managed Care – PPO | Admitting: Family Medicine

## 2019-12-13 ENCOUNTER — Other Ambulatory Visit: Payer: Self-pay | Admitting: Family Medicine

## 2020-01-04 ENCOUNTER — Encounter: Payer: Self-pay | Admitting: Nurse Practitioner

## 2020-01-04 ENCOUNTER — Other Ambulatory Visit: Payer: Self-pay

## 2020-01-04 ENCOUNTER — Ambulatory Visit (INDEPENDENT_AMBULATORY_CARE_PROVIDER_SITE_OTHER): Payer: BC Managed Care – PPO | Admitting: Nurse Practitioner

## 2020-01-04 VITALS — BP 142/77 | HR 77 | Ht 62.0 in | Wt 229.0 lb

## 2020-01-04 DIAGNOSIS — B002 Herpesviral gingivostomatitis and pharyngotonsillitis: Secondary | ICD-10-CM

## 2020-01-04 DIAGNOSIS — K2100 Gastro-esophageal reflux disease with esophagitis, without bleeding: Secondary | ICD-10-CM | POA: Diagnosis not present

## 2020-01-04 DIAGNOSIS — G43109 Migraine with aura, not intractable, without status migrainosus: Secondary | ICD-10-CM

## 2020-01-04 DIAGNOSIS — F4321 Adjustment disorder with depressed mood: Secondary | ICD-10-CM | POA: Diagnosis not present

## 2020-01-04 DIAGNOSIS — F4310 Post-traumatic stress disorder, unspecified: Secondary | ICD-10-CM

## 2020-01-04 MED ORDER — OMEPRAZOLE 20 MG PO CPDR: 20.0000 mg | DELAYED_RELEASE_CAPSULE | Freq: Every day | ORAL | 3 refills | Status: DC

## 2020-01-04 MED ORDER — ALPRAZOLAM 0.25 MG PO TABS
0.2500 mg | ORAL_TABLET | Freq: Two times a day (BID) | ORAL | 0 refills | Status: DC | PRN
Start: 2020-01-04 — End: 2020-03-01

## 2020-01-04 MED ORDER — LISINOPRIL-HYDROCHLOROTHIAZIDE 20-25 MG PO TABS: 1.0000 | ORAL_TABLET | Freq: Every day | ORAL | 1 refills | Status: DC

## 2020-01-04 MED ORDER — DULOXETINE HCL 60 MG PO CPEP
60.0000 mg | ORAL_CAPSULE | Freq: Two times a day (BID) | ORAL | 1 refills | Status: DC
Start: 2020-01-04 — End: 2020-10-28

## 2020-01-04 MED ORDER — RIZATRIPTAN BENZOATE 10 MG PO TABS: 10.0000 mg | ORAL_TABLET | ORAL | 4 refills | Status: DC | PRN

## 2020-01-04 MED ORDER — VALACYCLOVIR HCL 1 G PO TABS: 1000.0000 mg | ORAL_TABLET | Freq: Two times a day (BID) | ORAL | 1 refills | Status: DC

## 2020-01-04 NOTE — Patient Instructions (Addendum)
You can email me the paperwork for FMLA or Disability in order to write for you to work from home.  My email is: [email or web address]  I want you to continue to talk with the counselor.  Think about writing the notes to him like we discussed.      Managing Loss, Adult People experience loss in many different ways throughout their lives. Events such as moving, changing jobs, and losing friends can create a sense of loss. The loss may be as serious as a major health change, divorce, death of a pet, or death of a loved one. All of these types of loss are likely to create a physical and emotional reaction known as grief. Grief is the result of a major change or an absence of something or someone that you count on. Grief is a normal reaction to loss. A variety of factors can affect your grieving experience, including:  The nature of your loss.  Your relationship to what or whom you lost.  Your understanding of grief and how to manage it.  Your support system. How to manage lifestyle changes Keep to your normal routine as much as possible.  If you have trouble focusing or doing normal activities, it is acceptable to take some time away from your normal routine.  Spend time with friends and loved ones.  Eat a healthy diet, get plenty of sleep, and rest when you feel tired. How to recognize changes  The way that you deal with your grief will affect your ability to function as you normally do. When grieving, you may experience these changes:  Numbness, shock, sadness, anxiety, anger, denial, and guilt.  Thoughts about death.  Unexpected crying.  A physical sensation of emptiness in your stomach.  Problems sleeping and eating.  Tiredness (fatigue).  Loss of interest in normal activities.  Dreaming about or imagining seeing the person who died.  A need to remember what or whom you lost.  Difficulty thinking about anything other than your loss for a period of  time.  Relief. If you have been expecting the loss for a while, you may feel a sense of relief when it happens. Follow these instructions at home:  Activity Express your feelings in healthy ways, such as:  Talking with others about your loss. It may be helpful to find others who have had a similar loss, such as a support group.  Writing down your feelings in a journal.  Doing physical activities to release stress and emotional energy.  Doing creative activities like painting, sculpting, or playing or listening to music.  Practicing resilience. This is the ability to recover and adjust after facing challenges. Reading some resources that encourage resilience may help you to learn ways to practice those behaviors. General instructions  Be patient with yourself and others. Allow the grieving process to happen, and remember that grieving takes time. ? It is likely that you may never feel completely done with some grief. You may find a way to move on while still cherishing memories and feelings about your loss. ? Accepting your loss is a process. It can take months or longer to adjust.  Keep all follow-up visits as told by your health care provider. This is important. Where to find support To get support for managing loss:  Ask your health care provider for help and recommendations, such as grief counseling or therapy.  Think about joining a support group for people who are managing a loss. Where to find more information You  can find more information about managing loss from:  American Society of Clinical Oncology: www.cancer.net  American Psychological Association: [employer] Contact a health care provider if:  Your grief is extreme and keeps getting worse.  You have ongoing grief that does not improve.  Your body shows symptoms of grief, such as illness.  You feel depressed, anxious, or lonely. Get help right away if:  You have thoughts about hurting yourself or  others. If you ever feel like you may hurt yourself or others, or have thoughts about taking your own life, get help right away. You can go to your nearest emergency department or call:  Your local emergency services (911 in the [place]  A suicide crisis helpline, such as the National Suicide Prevention Lifeline at 737-255-1123. This is open 24 hours a day. Summary  Grief is the result of a major change or an absence of someone or something that you count on. Grief is a normal reaction to loss.  The depth of grief and the period of recovery depend on the type of loss and your ability to adjust to the change and process your feelings.  Processing grief requires patience and a willingness to accept your feelings and talk about your loss with people who are supportive.  It is important to find resources that work for you and to realize that people experience grief differently. There is not one grieving process that works for everyone in the same way.  Be aware that when grief becomes extreme, it can lead to more severe issues like isolation, depression, anxiety, or suicidal thoughts. Talk with your health care provider if you have any of these issues. This information is not intended to replace advice given to you by your health care provider. Make sure you discuss any questions you have with your health care provider. Document Revised: 03/11/2018 Document Reviewed: 05/21/2016 Elsevier Patient Education  2020 [employer]   Complicated Grief Grief is a normal response to the death of someone close to you. Feelings of fear, anger, and guilt can affect almost everyone who loses a loved one. It is also common to have symptoms of depression while you are grieving. These include problems with sleep, loss of appetite, and lack of energy. They may last for weeks or months after a loss. Complicated grief is different from normal grief or depression. Normal grieving involves sadness and feelings of  loss, but those feelings get better and heal over time. Complicated grief is a severe type of grief that lasts for a long time, usually for several months to a year or longer. It interferes with your ability to function normally. Complicated grief may require treatment from a mental health care provider. What are the causes? The cause of this condition is not known. It is not clear why some people continue to struggle with grief and others do not. What increases the risk? You are more likely to develop this condition if:  The death of your loved one was sudden or unexpected.  The death of your loved one was due to a violent event.  Your loved one died from suicide.  Your loved one was a child or a young person.  You were very close to your loved one, or you were dependent on him or her.  You have a history of depression or anxiety. What are the signs or symptoms? Symptoms of this condition include:  Feeling disbelief or having a lack of emotion (numbness).  Being unable to enjoy good memories  of your loved one.  Needing to avoid anything or anyone that reminds you of your loved one.  Being unable to stop thinking about the death.  Feeling intense anger or guilt.  Feeling alone and hopeless.  Feeling that your life is meaningless and empty.  Losing the desire to move on with your life. How is this diagnosed? This condition may be diagnosed based on:  Your symptoms. Complicated grief will be diagnosed if you have ongoing symptoms of grief for 6-12 months or longer.  The effect of symptoms on your life. You may be diagnosed with this condition if your symptoms are interfering with your ability to live your life. Your health care provider may recommend that you see a mental health care provider. Many symptoms of depression are similar to the symptoms of complicated grief. It is important to be evaluated for complicated grief along with other mental health conditions. How is this  treated? This condition is most commonly treated with talk therapy. This therapy is offered by a mental health specialist (psychiatrist). During therapy:  You will learn healthy ways to cope with the loss of your loved one.  Your mental health care provider may recommend antidepressant medicines. Follow these instructions at home: Lifestyle   Take care of yourself. ? Eat on a regular basis, and maintain a healthy diet. Eat plenty of fruits, vegetables, lean protein, and whole grains. ? Try to get some exercise each day. Aim for 30 minutes of exercise on most days of the week. ? Keep a consistent sleep schedule. Try to get 8 or more hours of sleep each night. ? Start doing the things that you used to enjoy.  Do not use drugs or alcohol to ease your symptoms.  Spend time with friends and loved ones. General instructions  Take over-the-counter and prescription medicines only as told by your health care provider.  Consider joining a grief (bereavement) support group to help you deal with your loss.  Keep all follow-up visits as told by your health care provider. This is important. Contact a health care provider if:  Your symptoms prevent you from functioning normally.  Your symptoms do not get better with treatment. Get help right away if:  You have serious thoughts about hurting yourself or someone else.  You have suicidal feelings. If you ever feel like you may hurt yourself or others, or have thoughts about taking your own life, get help right away. You can go to your nearest emergency department or call:  Your local emergency services (911 in the [place]  A suicide crisis helpline, such as the National Suicide Prevention Lifeline at 782-786-9305. This is open 24 hours a day. Summary  Complicated grief is a severe type of grief that lasts for a long time. This grief is not likely to go away on its own. Get the help you need.  Some griefs are more difficult than others and  can cause this condition. You may need a certain type of treatment to help you recover if the loss of your loved one was sudden, violent, or due to suicide.  You may feel guilty about moving on with your life. Getting help does not mean that you are forgetting your loved one. It means that you are taking care of yourself.  Complicated grief is best treated with talk therapy. Medicines may also be prescribed.  Seek the help you need, and find support that will help you recover. This information is not intended to replace advice given  to you by your health care provider. Make sure you discuss any questions you have with your health care provider. Document Revised: 12/18/2016 Document Reviewed: 10/21/2016 Elsevier Patient Education  2020 [employer]

## 2020-01-04 NOTE — Progress Notes (Signed)
Acute Office Visit  Subjective:    Patient ID: Kristen Spence, female    DOB: 11-02-1960, 59 y.o.   MRN: [id number]  Chief Complaint  Patient presents with  . Depression    HPI Kristen Spence is a 59 year old female that presents today to discuss emotional distress present since the sudden loss of her husband at the end of October this year.   She tells me that she and her husband were both infected with COVID last year in March and he suffered a much more severe case than she did requiring ICU admission for multiple days. She tells me he struggled to return to baseline after recovering. She tells me that he began to have difficulty breathing one evening and suddenly passed away unexpectedly from flash pulmonary edema.   She is experiencing extreme emotional distress over the loss and feelings of guilt that she may have brought COVID home from work with her. She tells that she must force herself to get up each day and get dressed and begin to work. She reports intense fear over the COVID virus to the point that she is terrified to let others in her home or go out places. She tells me that she is sleeping now, but sometimes she just wants to sleep all the time. She does not sleep in the bedroom at this point, as it is too painful for her.   She tells me that this week she was informed by her job that she will be required to return to work in person in the coming weeks and she is frantic and distraught over the thought of going back to the office that she feels was the cause of the initial illness that eventually led to her husbands death. She reports extreme feelings of panic at the idea that she will have to be present in the company of others who may be unvaccinated and possibly coming to work sick. She is incredibly fearful of contracting the virus again. She is fearful of losing her job, but tells me that she cannot bear the thought of returning to the building to work for fear for her life.  She is unsure of what she can do.   She tells me that since her initial visit with Dr. Linford Arnold in October, she has been speaking to a counselor weekly on the phone and she feels these sessions are helpful. She does feel her daily medication helps, but the pain is still too fresh for relief of her grief and anxiety. She is only taking the 1/2 tab of xanax when absolutely necessary for panic, which is on average twice a week. She feels the 0.5mg  dose was too high and has taken 0.25mg  doses, but would like a smaller dose if possible due to inability to cut the tab into quarter sections.   She speaks with her son several times a day and sees him at least every few days. She tells me she enjoys her job and working from home and it gives her purpose. She would like to eventually return to the office, but says that at this time she cannot put her life in danger.   She denies feelings of self harm or thoughts that she would be better off dead. Her PHQ-9 is 17 today with a somewhat difficult and her GAD 7 is 14 with a somewhat difficult.   GAD 7 : Generalized Anxiety Score 01/04/2020 10/05/2019 12/19/2018 09/12/2018  Nervous, Anxious, on Edge 2 2 1  2  Control/stop worrying 2 3 3 2   Worry too much - different things 2 3 3 2   Trouble relaxing 3 2 1 1   Restless 1 1 0 2  Easily annoyed or irritable 1 0 1 1  Afraid - awful might happen 3 1 1 2   Total GAD 7 Score 14 12 10 12   Anxiety Difficulty Somewhat difficult Somewhat difficult Somewhat difficult Somewhat difficult    Depression screen [facility] 2/9 01/04/2020 10/05/2019 04/13/2019  Decreased Interest 3 - 1  Down, Depressed, Hopeless 2 2 0  PHQ - 2 Score 5 2 1   Altered sleeping 3 1 -  Tired, decreased energy 3 - -  Change in appetite 2 0 -  Feeling bad or failure about yourself  1 0 -  Trouble concentrating 3 1 -  Moving slowly or fidgety/restless 0 0 -  Suicidal thoughts 0 0 -  PHQ-9 Score 17 4 -  Difficult doing work/chores Somewhat difficult Somewhat  difficult -    Past Medical History:  Diagnosis Date  . Benign positional vertigo   . Dizziness   . Edema   . Fatigue   . GERD (gastroesophageal reflux disease)    hiatal hernia  . Heart palpitations   . Hiatal hernia   . Hypertension   . Insomnia   . Mental disorder   . Migraine     Past Surgical History:  Procedure Laterality Date  . BREAST BIOPSY    . BREAST EXCISIONAL BIOPSY Left   . BREAST LUMPECTOMY  2000   . CESAREAN SECTION  1982  . CHOLECYSTECTOMY  1987  . ENDOMETRIAL ABLATION  09/30/2006  . ESOPHAGOGASTRODUODENOSCOPY     Showed grade D. reflux esophagitis  . TOTAL VAGINAL HYSTERECTOMY  09/27/2008   Dr. 01/06/2020    Family History  Problem Relation Age of Onset  . Alcohol abuse Father   . Stroke Father   . Hypertension Father   . Ovarian cancer Mother   . Depression Mother   . Diabetes Mother   . Hyperlipidemia Mother   . Hypertension Mother   . COPD Mother   . Heart attack Sister 42  . Breast cancer Sister     Social History   Socioeconomic History  . Marital status: Married    Spouse name: bobby   . Number of children: 2  . Years of education: 10th grade  . Highest education level: Not on file  Occupational History  . Occupation:      Employer: [employer]  Tobacco Use  . Smoking status: Never Smoker  . Smokeless tobacco: Never Used  Substance and Sexual Activity  . Alcohol use: No  . Drug use: No  . Sexual activity: Never    Birth control/protection: Surgical  Other Topics Concern  . Not on file  Social History Narrative   No regular exercise   Social Determinants of Health   Financial Resource Strain: Not on file  Food Insecurity: Not on file  Transportation Needs: Not on file  Physical Activity: Not on file  Stress: Not on file  Social Connections: Not on file  Intimate Partner Violence: Not on file    Outpatient Medications Prior to Visit  Medication Sig Dispense Refill  . ALPRAZolam (XANAX) 0.5 MG tablet  Take 1 tablet (0.5 mg total) by mouth daily as needed for anxiety. 30 tablet 0  . cholecalciferol (VITAMIN D) 1000 units tablet Take 2,000 Units by mouth daily.    . diclofenac sodium (VOLTAREN) 1 % GEL  Apply 4 g topically 4 (four) times daily. To affected joint. 100 g 11  . DULoxetine (CYMBALTA) 60 MG capsule Take 1 capsule (60 mg total) by mouth 2 (two) times daily. 180 capsule 1  . gabapentin (NEURONTIN) 300 MG capsule Take 2 capsules (600 mg total) by mouth 3 (three) times daily. 540 capsule 3  . ipratropium (ATROVENT) 0.03 % nasal spray Place 2 sprays into both nostrils every 12 (twelve) hours. 30 mL 2  . lisinopril-hydrochlorothiazide (ZESTORETIC) 20-25 MG tablet Take 1 tablet by mouth daily. 90 tablet 1  . meloxicam (MOBIC) 15 MG tablet TAKE 1 TABLET (15 MG TOTAL) BY MOUTH DAILY. PRN 90 tablet 1  . montelukast (SINGULAIR) 10 MG tablet TAKE 1 TABLET BY MOUTH EVERYDAY AT BEDTIME 90 tablet 3  . omeprazole (PRILOSEC) 20 MG capsule Take 1-2 capsules (20-40 mg total) by mouth daily. 180 capsule 3  . rizatriptan (MAXALT) 10 MG tablet Take 1 tablet (10 mg total) by mouth as needed. May repeat in 2 hours if needed 36 tablet 4  . valACYclovir (VALTREX) 1000 MG tablet Take 1 tablet (1,000 mg total) by mouth 2 (two) times daily. When needed for outbreatk 30 tablet 1  . zolpidem (AMBIEN) 5 MG tablet Take 1 tablet (5 mg total) by mouth at bedtime as needed for sleep. 15 tablet 1  . mometasone (NASONEX) 50 MCG/ACT nasal spray Place 2 sprays into the nose daily. 17 g 3   No facility-administered medications prior to visit.    Allergies  Allergen Reactions  . Banana Hives  . Clonazepam Other (See Comments)    Makes her feel weird.     Review of Systems All review of systems negative except what is listed in the HPI     Objective:    Physical Exam Vitals and nursing note reviewed.  Constitutional:      General: She is in acute distress.     Appearance: She is obese.  HENT:     Head:  Normocephalic.     Nose: Congestion and rhinorrhea present.     Mouth/Throat:     Lips: Lesions present.     Mouth: Mucous membranes are moist.     Pharynx: Oropharynx is clear.   Eyes:     Extraocular Movements: Extraocular movements intact.     Pupils: Pupils are equal, round, and reactive to light.  Cardiovascular:     Rate and Rhythm: Normal rate.     Pulses: Normal pulses.  Pulmonary:     Effort: Pulmonary effort is normal.  Musculoskeletal:        General: Normal range of motion.     Cervical back: Normal range of motion.  Skin:    General: Skin is warm and dry.     Capillary Refill: Capillary refill takes less than 2 seconds.  Neurological:     General: No focal deficit present.     Mental Status: She is alert and oriented to person, place, and time.  Psychiatric:        Attention and Perception: Attention normal.        Mood and Affect: Mood is anxious. Affect is tearful.        Speech: Speech normal.        Behavior: Behavior is cooperative.        Thought Content: Thought content is not paranoid or delusional. Thought content does not include suicidal ideation.        Cognition and Memory: Cognition normal.  Judgment: Judgment normal.     BP (!) 142/77   Pulse 77   Ht 5\' 2"  (1.575 m)   Wt 229 lb (103.9 kg)   SpO2 98%   BMI 41.88 kg/m  Wt Readings from Last 3 Encounters:  01/04/20 229 lb (103.9 kg)  10/05/19 231 lb (104.8 kg)  04/13/19 221 lb (100.2 kg)    Health Maintenance Due  Topic Date Due  . COVID-19 Vaccine (1) Never done    There are no preventive care reminders to display for this patient.   Lab Results  Component Value Date   TSH 2.74 10/09/2019   Lab Results  Component Value Date   WBC 8.2 10/09/2019   HGB 12.1 10/09/2019   HCT 36.1 10/09/2019   MCV 88.7 10/09/2019   PLT 318 10/09/2019   Lab Results  Component Value Date   NA 142 10/09/2019   K 4.2 10/09/2019   CO2 26 10/09/2019   GLUCOSE 94 10/09/2019   BUN 17  10/09/2019   CREATININE 0.97 10/09/2019   BILITOT 0.4 10/09/2019   ALKPHOS 74 11/01/2015   AST 15 10/09/2019   ALT 20 10/09/2019   PROT 6.6 10/09/2019   ALBUMIN 4.1 11/01/2015   CALCIUM 9.1 10/09/2019   Lab Results  Component Value Date   CHOL 154 10/09/2019   Lab Results  Component Value Date   HDL 41 (L) 10/09/2019   Lab Results  Component Value Date   LDLCALC 93 10/09/2019   Lab Results  Component Value Date   TRIG 102 10/09/2019   Lab Results  Component Value Date   CHOLHDL 3.8 10/09/2019   Lab Results  Component Value Date   HGBA1C 5.2 10/09/2019       Assessment & Plan:   1. Complicated grief 2. PTSD (post-traumatic stress disorder) Patient is experiencing severe symptoms of grief related to the loss of her husband recently to complications associated with COVID-19 infection. She does appear to be improving in her emotional state somewhat with less frequent episodes of panic and she is resting better, but her mood is still quite unstable. She is still not eating well.  Discussed that the grief process takes time and encouraged continued daily medication as well as counseling sessions. Also recommended therapeutic exercises that may be helpful in dealing with loneliness and missing her husband.  Encouraged daily interaction with at least two individuals, even if via telephone, to ensure that she is staying social. Recommend slowly getting out of the house, even if to go to the park, to help with mood and fears of being around people.  She is most certainly expressing symptoms of post-traumatic stress disorder with associating her office building with her husbands ultimate demise. She is extremely emotional and fearful of the thought of returning to the office and fearful of the thought of getting COVID again. She is also experiencing increased fears of being around others in general.  Her fears of the virus are valid in the fact that she is considered a high risk  individual with her medical history, but the escalation of the fears to a near panic state when discussing this is evident that her level of fear is at an unhealthy level. I do feel she would benefit from a slow transition to increased socialization in a safe manner.  It is apparent from this encounter that a return to the office at this time would not be in her best interest emotionally and I feel that this could cause a  serious decline in her mental state.  Discussed with the patient that there are exceptions that can be made for certain medical conditions that may allow her to continue to work from home based on her current emotional state. I recommended that she contact her HR department to discuss the option of possible FMLA or Disability paperwork to allow her the option to work from home for a limited period of time. We discussed that the ultimate goal would be to return to work in the office building and she expressed that this is her wish. I do feel that with additional time to grieve and mentally prepare herself for the return to work she would be successful. We discussed the option to start alprazolam 0.25mg  with the option to cut the tablet in 1/2- prescription sent. Reiterated that this medication is to be used for only severe states of panic and anxiety and is not safe to be a long term medication. She expressed understanding. I do not feel that she is at any risk of abuse or harm with the medication.  Patient to contact her HR department and supply me with the necessary paperwork to request accommodations to work from home. Recommend follow-up to discuss mood if symptoms worsen or to call emergency hotline for any thoughts of self-harm.  - ALPRAZolam (XANAX) 0.25 MG tablet; Take 1 tablet (0.25 mg total) by mouth 2 (two) times daily as needed for anxiety.  Dispense: 20 tablet; Refill: 0  3. Gastroesophageal reflux disease Stable. Refill provided - omeprazole (PRILOSEC) 20 MG capsule; Take 1-2  capsules (20-40 mg total) by mouth daily.  Dispense: 180 capsule; Refill: 3  4. Migraine with aura and without status migrainosus, not intractable Stable. Refill provided - rizatriptan (MAXALT) 10 MG tablet; Take 1 tablet (10 mg total) by mouth as needed. May repeat in 2 hours if needed  Dispense: 36 tablet; Refill: 4  5. Oral herpes Current outbreak on lower lip- most likely from stress. Refill provided.  - valACYclovir (VALTREX) 1000 MG tablet; Take 1 tablet (1,000 mg total) by mouth 2 (two) times daily. When needed for outbreatk  Dispense: 30 tablet; Refill: 1   Time: 47 minutes spent on this visit in person with patient, >50% spent counseling/or care coordination.    Tollie Eth, NP

## 2020-01-08 DIAGNOSIS — R21 Rash and other nonspecific skin eruption: Secondary | ICD-10-CM | POA: Diagnosis not present

## 2020-01-25 ENCOUNTER — Telehealth: Payer: Self-pay | Admitting: Family Medicine

## 2020-01-25 NOTE — Telephone Encounter (Signed)
Pt called. Kristen Spence is doing a return to work letter for her.  Company's email address is [email or web address] and their fax number is [phone].  Thank you.

## 2020-01-26 NOTE — Telephone Encounter (Signed)
Return to work letter written and sent to email address provided. 01/26/2020 1550

## 2020-01-28 DIAGNOSIS — U071 COVID-19: Secondary | ICD-10-CM | POA: Diagnosis not present

## 2020-01-28 DIAGNOSIS — R059 Cough, unspecified: Secondary | ICD-10-CM | POA: Diagnosis not present

## 2020-01-29 NOTE — Telephone Encounter (Signed)
Patient notified letter was taken care and sent to email provided.

## 2020-02-28 ENCOUNTER — Telehealth: Payer: Self-pay | Admitting: *Deleted

## 2020-02-28 ENCOUNTER — Telehealth: Payer: Self-pay

## 2020-02-28 NOTE — Telephone Encounter (Signed)
Pt advised to drop off her forms/information that is needed for her employer before her visit so that Dr. Linford Arnold can look over this and start working on it because this information will NOT be ready for her to take with her when she leaves her visit on Friday.   Pt stated that she had spoken with her employer and informed them that she would need additional time to complete this and that they are aware of this. She was grateful that she is able to come in to see Dr. Linford Arnold to discuss what is going on with her situation and get this taken care of.  She said that HR had emailed the information to her and that she would print this off and bring it in. I gave her my email address and told her that if it does allow her email to come thru I will print this off for her. She said that she would try to send it otherwise she would bring it in on Friday.

## 2020-02-28 NOTE — Telephone Encounter (Signed)
Patient has appointment for Friday.  If she can drop the forms off ahead of time then we can try to get started on them.  Did check with Di Kindle she says she never saw the forms to be completed.

## 2020-02-28 NOTE — Telephone Encounter (Signed)
Pt called and said that she had talked with Les Pou about getting a letter for her employer allowing her to work from home and she keeps being told they did not receive anything from our office. After reviewing her chart, I do see that she had an office visit with Les Pou on 12/20/2019 where having FMLA forms completed were discussed. The office note says that she is to check with her HR department and bring the necessary paperwork to our office for completion. There was also a phone call on 01/25/2020 in which a return to work note was created and can be seen under "Letters" tab in her chart. I do not see an encounter stating the FMLA paperwork has been off or a copy of FMLA paperwork scanned in her chart. I looked in the fax bin and in my personal file drawer and am not able to find FMLA paperwork for her at all. She said someone from our office called her and told her it was done. When looking at the 01/25/2020 telephone encounter, a work note was printed and sent and Toniann Fail called her to tell her this had been done. Pt states she was out due to PTSD after her husband passed away and that in conversations with [employer] she was told that she should not return to working in the building, that she should continue to work from home, due to the rise of COVID numbers. I apologized that I was not able to find the paperwork, that Les Pou was not with our practice any longer, and that I could send a message to Dr. Linford Arnold about this and call her back but she said she would like an appointment with Dr. Linford Arnold to talk about this because she is supposed to return to working in the office next week. Call transferred to the front desk for scheduling. While creating this encounter, Dom came back and said that they did not have a copy of FMLA paperwork for her at the front desk either.

## 2020-02-29 NOTE — Telephone Encounter (Signed)
Pt aware of recommendation to bring the forms by prior to her appointment.

## 2020-03-01 ENCOUNTER — Encounter: Payer: Self-pay | Admitting: Family Medicine

## 2020-03-01 ENCOUNTER — Ambulatory Visit (INDEPENDENT_AMBULATORY_CARE_PROVIDER_SITE_OTHER): Payer: BC Managed Care – PPO | Admitting: Family Medicine

## 2020-03-01 ENCOUNTER — Other Ambulatory Visit: Payer: Self-pay

## 2020-03-01 VITALS — BP 134/79 | HR 70 | Ht 62.0 in | Wt 232.0 lb

## 2020-03-01 DIAGNOSIS — F4310 Post-traumatic stress disorder, unspecified: Secondary | ICD-10-CM | POA: Insufficient documentation

## 2020-03-01 DIAGNOSIS — F4321 Adjustment disorder with depressed mood: Secondary | ICD-10-CM

## 2020-03-01 DIAGNOSIS — F4001 Agoraphobia with panic disorder: Secondary | ICD-10-CM

## 2020-03-01 MED ORDER — ALPRAZOLAM 0.25 MG PO TABS: 0.2500 mg | ORAL_TABLET | Freq: Every day | ORAL | 0 refills | Status: DC | PRN

## 2020-03-01 NOTE — Progress Notes (Signed)
Pt has forms from her employer for her to get a medical accomodation that will allow her to work from home should Dr. Linford Arnold feel that this would be that this is appropriate.

## 2020-03-01 NOTE — Progress Notes (Signed)
Established Patient Office Visit  Subjective:  Patient ID: Kristen Spence, female    DOB: [birth year]  Age: 60 y.o. MRN: [id number]  CC:  Chief Complaint  Patient presents with  . Follow-up    HPI Kristen Spence presents for grief.  She is just really been struggling since the sudden loss of her husband.  She is actually taken him to 3 doctors appointments and he suddenly died on a Apr 27, 2022 night.  She is spent some time talking to him around 5:00 that evening and then later died suddenly it sounds like he had some type of flash pulmonary edema.  She is just not sleeping well and resting well some nights she will use a half a tab of Xanax to help her sleep.  Occasionally she will sleep okay.  She just feels completely "terrified" to be around others and leave the house right now she is very fearful of Covid infection she feels like she may have been the 1 to bring Covid home to her husband and blames herself.  She has met with the hospice counselor a few times via virtual visit but says it really just has not been helpful they want her to read materials and join a group and she is just not at the point of being able to concentrate well enough to read the materials or join a group therapy session.  She still just really struggling she says every day she has to make herself get out of bed and actually get dressed and work.  She does not feel like it is affected her performance significantly.  She does have days where she is much more weepy and tearful and is just really fearful of again leaving the home and being around other people right now especially people who are not vaccinated and who do not take it seriously.  She has been using the alprazolam sparingly.  She is also on Cymbalta 60 mg.  Reviewed notes from previous enc with [name]  Past Medical History:  Diagnosis Date  . Benign positional vertigo   . Dizziness   . Edema   . Fatigue   . GERD (gastroesophageal reflux disease)    hiatal  hernia  . Heart palpitations   . Hiatal hernia   . Hypertension   . Insomnia   . Mental disorder   . Migraine     Past Surgical History:  Procedure Laterality Date  . BREAST BIOPSY    . BREAST EXCISIONAL BIOPSY Left   . BREAST LUMPECTOMY  2000   . [place]  . CHOLECYSTECTOMY  1987  . ENDOMETRIAL ABLATION  09/30/2006  . ESOPHAGOGASTRODUODENOSCOPY     Showed grade D. reflux esophagitis  . TOTAL VAGINAL HYSTERECTOMY  09/27/2008   Dr. Huntley Estelle    Family History  Problem Relation Age of Onset  . Alcohol abuse Father   . Stroke Father   . Hypertension Father   . Ovarian cancer Mother   . Depression Mother   . Diabetes Mother   . Hyperlipidemia Mother   . Hypertension Mother   . COPD Mother   . Heart attack Sister 23  . Breast cancer Sister     Social History   Socioeconomic History  . Marital status: Married    Spouse name: bobby   . Number of children: 2  . Years of education: 10th grade  . Highest education level: Not on file  Occupational History  . Occupation:  Employer: [employer]  Tobacco Use  . Smoking status: Never Smoker  . Smokeless tobacco: Never Used  Substance and Sexual Activity  . Alcohol use: No  . Drug use: No  . Sexual activity: Never    Birth control/protection: Surgical  Other Topics Concern  . Not on file  Social History Narrative   No regular exercise   Social Determinants of Health   Financial Resource Strain: Not on file  Food Insecurity: Not on file  Transportation Needs: Not on file  Physical Activity: Not on file  Stress: Not on file  Social Connections: Not on file  Intimate Partner Violence: Not on file    Outpatient Medications Prior to Visit  Medication Sig Dispense Refill  . cholecalciferol (VITAMIN D) 1000 units tablet Take 2,000 Units by mouth daily.    . diclofenac sodium (VOLTAREN) 1 % GEL Apply 4 g topically 4 (four) times daily. To affected joint. 100 g 11  . DULoxetine  (CYMBALTA) 60 MG capsule Take 1 capsule (60 mg total) by mouth 2 (two) times daily. 180 capsule 1  . gabapentin (NEURONTIN) 300 MG capsule Take 2 capsules (600 mg total) by mouth 3 (three) times daily. 540 capsule 3  . ipratropium (ATROVENT) 0.03 % nasal spray Place 2 sprays into both nostrils every 12 (twelve) hours. 30 mL 2  . lisinopril-hydrochlorothiazide (ZESTORETIC) 20-25 MG tablet Take 1 tablet by mouth daily. 90 tablet 1  . meloxicam (MOBIC) 15 MG tablet TAKE 1 TABLET (15 MG TOTAL) BY MOUTH DAILY. PRN 90 tablet 1  . montelukast (SINGULAIR) 10 MG tablet TAKE 1 TABLET BY MOUTH EVERYDAY AT BEDTIME 90 tablet 3  . omeprazole (PRILOSEC) 20 MG capsule Take 1-2 capsules (20-40 mg total) by mouth daily. 180 capsule 3  . rizatriptan (MAXALT) 10 MG tablet Take 1 tablet (10 mg total) by mouth as needed. May repeat in 2 hours if needed 36 tablet 4  . valACYclovir (VALTREX) 1000 MG tablet Take 1 tablet (1,000 mg total) by mouth 2 (two) times daily. When needed for outbreatk 30 tablet 1  . zolpidem (AMBIEN) 5 MG tablet Take 1 tablet (5 mg total) by mouth at bedtime as needed for sleep. 15 tablet 1  . ALPRAZolam (XANAX) 0.25 MG tablet Take 1 tablet (0.25 mg total) by mouth 2 (two) times daily as needed for anxiety. 20 tablet 0   No facility-administered medications prior to visit.    Allergies  Allergen Reactions  . Banana Hives  . Clonazepam Other (See Comments)    Makes her feel weird.     ROS Review of Systems    Objective:    Physical Exam  BP 134/79   Pulse 70   Ht $R'5\' 2"'wE$  (1.575 m)   Wt 232 lb (105.2 kg)   SpO2 99%   BMI 42.43 kg/m  Wt Readings from Last 3 Encounters:  03/01/20 232 lb (105.2 kg)  01/04/20 229 lb (103.9 kg)  10/05/19 231 lb (104.8 kg)     Health Maintenance Due  Topic Date Due  . COVID-19 Vaccine (3 - Booster for Pfizer series) 11/03/2019    There are no preventive care reminders to display for this patient.  Lab Results  Component Value Date   TSH  2.74 10/09/2019   Lab Results  Component Value Date   WBC 8.2 10/09/2019   HGB 12.1 10/09/2019   HCT 36.1 10/09/2019   MCV 88.7 10/09/2019   PLT 318 10/09/2019   Lab Results  Component Value Date  NA 142 10/09/2019   K 4.2 10/09/2019   CO2 26 10/09/2019   GLUCOSE 94 10/09/2019   BUN 17 10/09/2019   CREATININE 0.97 10/09/2019   BILITOT 0.4 10/09/2019   ALKPHOS 74 11/01/2015   AST 15 10/09/2019   ALT 20 10/09/2019   PROT 6.6 10/09/2019   ALBUMIN 4.1 11/01/2015   CALCIUM 9.1 10/09/2019   Lab Results  Component Value Date   CHOL 154 10/09/2019   Lab Results  Component Value Date   HDL 41 (L) 10/09/2019   Lab Results  Component Value Date   LDLCALC 93 10/09/2019   Lab Results  Component Value Date   TRIG 102 10/09/2019   Lab Results  Component Value Date   CHOLHDL 3.8 10/09/2019   Lab Results  Component Value Date   HGBA1C 5.2 10/09/2019      Assessment & Plan:   Problem List Items Addressed This Visit      Other   PTSD (post-traumatic stress disorder) - Primary   Relevant Medications   ALPRAZolam (XANAX) 0.25 MG tablet   Panic disorder with agoraphobia    She does have a history of panic disorder with agoraphobia and this has really triggered her again and not wanting to leave the house because of Covid.  Again she describes being terrified and extremely anxious.  It provokes a lot of anxiety for her to leave the home.      Relevant Medications   ALPRAZolam (XANAX) 0.25 MG tablet   Complicated grief    Discussed strategies to help get her better.  I would like to get her in with a therapist that she can work with regularly and do some goalsetting over time.  We will go ahead and place referral today likely take a couple weeks to get her an appointment and then to get established at like to check back up with her about 6 weeks to make sure that she is doing well and hopefully making some progress.  And then will follow up 6 weeks after that as well to  make a judgment about whether or not she might be okay to return to work.  So for right now we will go ahead and write her out for the next 12 weeks.  I do have paperwork which she has dropped off for [place] to complete.  I did go ahead and refill her alprazolam today.  But just reminded her again to use very sparingly and not to use it daily.  For now we will continue with Cymbalta I do not recommend any medication change I think she will benefit more from working with a therapist or counselor and really setting some goals to hopefully get back into the workplace full-time.  She does feel like her work and her boss have been supportive of her and understanding of everything that she is going through.      Relevant Medications   ALPRAZolam (XANAX) 0.25 MG tablet      Meds ordered this encounter  Medications  . ALPRAZolam (XANAX) 0.25 MG tablet    Sig: Take 1 tablet (0.25 mg total) by mouth daily as needed for anxiety. 90 day supply    Dispense:  60 tablet    Refill:  0    Follow-up: Return in about 6 weeks (around 04/12/2020) for Grief.    I spent 32 minutes on the day of the encounter to include pre-visit record review, face-to-face time with the patient and post visit ordering of test.  Beatrice Lecher, MD

## 2020-03-01 NOTE — Assessment & Plan Note (Signed)
Discussed strategies to help get her better.  I would like to get her in with a therapist that she can work with regularly and do some goalsetting over time.  We will go ahead and place referral today likely take a couple weeks to get her an appointment and then to get established at like to check back up with her about 6 weeks to make sure that she is doing well and hopefully making some progress.  And then will follow up 6 weeks after that as well to make a judgment about whether or not she might be okay to return to work.  So for right now we will go ahead and write her out for the next 12 weeks.  I do have paperwork which she has dropped off for [place] to complete.  I did go ahead and refill her alprazolam today.  But just reminded her again to use very sparingly and not to use it daily.  For now we will continue with Cymbalta I do not recommend any medication change I think she will benefit more from working with a therapist or counselor and really setting some goals to hopefully get back into the workplace full-time.  She does feel like her work and her boss have been supportive of her and understanding of everything that she is going through.

## 2020-03-01 NOTE — Assessment & Plan Note (Signed)
She does have a history of panic disorder with agoraphobia and this has really triggered her again and not wanting to leave the house because of Covid.  Again she describes being terrified and extremely anxious.  It provokes a lot of anxiety for her to leave the home.

## 2020-03-18 ENCOUNTER — Telehealth: Payer: Self-pay

## 2020-03-18 NOTE — Telephone Encounter (Signed)
Kristen Spence called and left a message stating she would like the paperwork you sent to her employer emailed to her. She states they have not received the paperwork.   E-mail [email or web address]

## 2020-03-19 NOTE — Telephone Encounter (Signed)
Pt forms given to Kristen Spence. Up front to email to pt's work email address and to scan into her chart.  Called pt and advised her of this and asked if she has received the email she stated that she hasn't. I told her that I would check back on tomorrow.

## 2020-04-03 ENCOUNTER — Telehealth: Payer: Self-pay

## 2020-04-03 MED ORDER — FLUCONAZOLE 150 MG PO TABS: 150.0000 mg | ORAL_TABLET | Freq: Once | ORAL | 0 refills | Status: DC

## 2020-04-03 NOTE — Telephone Encounter (Signed)
Kristen Spence called and left a message stating she needs a refill on Diflucan. She states Dr Linford Arnold always lets her have Diflucan on hand for yeast infections.   Not on medication list. Please advise.

## 2020-04-03 NOTE — Telephone Encounter (Signed)
Med sent.

## 2020-04-04 ENCOUNTER — Other Ambulatory Visit: Payer: Self-pay

## 2020-04-04 MED ORDER — NYSTATIN 100000 UNIT/GM EX POWD: CUTANEOUS | 2 refills | Status: DC

## 2020-04-05 NOTE — Telephone Encounter (Signed)
Patient advised.

## 2020-04-12 ENCOUNTER — Ambulatory Visit: Payer: BC Managed Care – PPO | Admitting: Family Medicine

## 2020-05-09 ENCOUNTER — Encounter: Payer: Self-pay | Admitting: Family Medicine

## 2020-05-09 ENCOUNTER — Other Ambulatory Visit: Payer: Self-pay

## 2020-05-09 ENCOUNTER — Ambulatory Visit: Payer: BC Managed Care – PPO | Admitting: Family Medicine

## 2020-05-09 VITALS — BP 132/75 | HR 78 | Ht 62.0 in | Wt 238.0 lb

## 2020-05-09 DIAGNOSIS — R21 Rash and other nonspecific skin eruption: Secondary | ICD-10-CM

## 2020-05-09 DIAGNOSIS — T887XXA Unspecified adverse effect of drug or medicament, initial encounter: Secondary | ICD-10-CM

## 2020-05-09 DIAGNOSIS — F4321 Adjustment disorder with depressed mood: Secondary | ICD-10-CM

## 2020-05-09 MED ORDER — METHYLPREDNISOLONE SODIUM SUCC 125 MG IJ SOLR
125.0000 mg | Freq: Once | INTRAMUSCULAR | Status: AC
  Administered 2020-05-09: 125 mg via INTRAMUSCULAR

## 2020-05-09 MED ORDER — BUPROPION HCL ER (XL) 150 MG PO TB24: 150.0000 mg | ORAL_TABLET | ORAL | 0 refills | Status: DC

## 2020-05-09 MED ORDER — DICLOFENAC SODIUM 1 % EX GEL: 4.0000 g | Freq: Four times a day (QID) | CUTANEOUS | 11 refills | Status: DC

## 2020-05-09 MED ORDER — PREDNISONE 20 MG PO TABS: 40.0000 mg | ORAL_TABLET | Freq: Every day | ORAL | 0 refills | Status: DC

## 2020-05-09 NOTE — Assessment & Plan Note (Signed)
Discussed options.  We will continue with Cymbalta.  We will add Wellbutrin to see if this helps with energy and focus.  If this is not effective when I see her back then consider changing the Cymbalta to a different SSRI.  She has been managing to work from home even though its been very difficult.  So we will get up-to-date paperwork so that she can continue to work from home from for now.  I think a return date is in about 2 weeks.  Plan to follow-up in 3 weeks.  She has not heard from the therapy/counseling referral that we placed so I will place a new one today.

## 2020-05-09 NOTE — Progress Notes (Signed)
Established Patient Office Visit  Subjective:  Patient ID: Kristen CastleBrenda K Schollmeyer, female    DOB: 04/13/1960  Age: 60 y.o. MRN: [id number]  CC:  Chief Complaint  Patient presents with  . Follow-up  . Rash    HPI Kristen Spence presents for Rash.    She reports that rash broke out on her chest about 2 weeks ago its been gradually spreading now down her shoulders and onto her neck.  Now it spreading up onto her face over the last couple of days she says it is itchy.  She has tried a moisturizer as well as some over-the-counter hydrocortisone cream but really is not getting significant relief.  He denies any new soaps, detergents, lotions etc. no new medications or over-the-counter supplements.  We have not changed any of her medications recently again she did try the Ambien but she stopped it already.  Follow-up for complicated grief-she still struggling she has been able to get up and work from home but says every day she has to make herself get up.  She has not been getting out of the house.  She says in fact her son came to stay with her for about 4 5 days because he was getting concerned about her she has no thoughts of wanting to harm herself but she says she cries almost every day.  She does not feel ready to go back into the workplace.  She did try the Ambien for sleep but says it gave her nightmares so she stopped it.  Says she has been using her Xanax at night to help calm herself and to go to sleep.  She says if she does not take it she only sleeps for couple of hours.  Past Medical History:  Diagnosis Date  . Benign positional vertigo   . Dizziness   . Edema   . Fatigue   . GERD (gastroesophageal reflux disease)    hiatal hernia  . Heart palpitations   . Hiatal hernia   . Hypertension   . Insomnia   . Mental disorder   . Migraine     Past Surgical History:  Procedure Laterality Date  . BREAST BIOPSY    . BREAST EXCISIONAL BIOPSY Left   . BREAST LUMPECTOMY  2000   .  CESAREAN SECTION  1982  . CHOLECYSTECTOMY  1987  . ENDOMETRIAL ABLATION  09/30/2006  . ESOPHAGOGASTRODUODENOSCOPY     Showed grade D. reflux esophagitis  . TOTAL VAGINAL HYSTERECTOMY  09/27/2008   Dr. Lavon PaganiniStacey Reynolds    Family History  Problem Relation Age of Onset  . Alcohol abuse Father   . Stroke Father   . Hypertension Father   . Ovarian cancer Mother   . Depression Mother   . Diabetes Mother   . Hyperlipidemia Mother   . Hypertension Mother   . COPD Mother   . Heart attack Sister [id number]  . Breast cancer Sister     Social History   Socioeconomic History  . Marital status: Married    Spouse name: bobby   . Number of children: 2  . Years of education: 10th grade  . Highest education level: Not on file  Occupational History  . Occupation:      Employer: [employer]  Tobacco Use  . Smoking status: Never Smoker  . Smokeless tobacco: Never Used  Substance and Sexual Activity  . Alcohol use: No  . Drug use: No  . Sexual activity: Never    Birth control/protection:  Surgical  Other Topics Concern  . Not on file  Social History Narrative   No regular exercise   Social Determinants of Health   Financial Resource Strain: Not on file  Food Insecurity: Not on file  Transportation Needs: Not on file  Physical Activity: Not on file  Stress: Not on file  Social Connections: Not on file  Intimate Partner Violence: Not on file    Outpatient Medications Prior to Visit  Medication Sig Dispense Refill  . ALPRAZolam (XANAX) 0.25 MG tablet Take 1 tablet (0.25 mg total) by mouth daily as needed for anxiety. 90 day supply 60 tablet 0  . cholecalciferol (VITAMIN D) 1000 units tablet Take 2,000 Units by mouth daily.    . DULoxetine (CYMBALTA) 60 MG capsule Take 1 capsule (60 mg total) by mouth 2 (two) times daily. 180 capsule 1  . gabapentin (NEURONTIN) 300 MG capsule Take 2 capsules (600 mg total) by mouth 3 (three) times daily. 540 capsule 3  . ipratropium (ATROVENT) 0.03 %  nasal spray Place 2 sprays into both nostrils every 12 (twelve) hours. 30 mL 2  . lisinopril-hydrochlorothiazide (ZESTORETIC) 20-25 MG tablet Take 1 tablet by mouth daily. 90 tablet 1  . meloxicam (MOBIC) 15 MG tablet TAKE 1 TABLET (15 MG TOTAL) BY MOUTH DAILY. PRN 90 tablet 1  . montelukast (SINGULAIR) 10 MG tablet TAKE 1 TABLET BY MOUTH EVERYDAY AT BEDTIME 90 tablet 3  . nystatin (MYCOSTATIN/NYSTOP) powder Apply to affected area 3 times daily 60 g 2  . omeprazole (PRILOSEC) 20 MG capsule Take 1-2 capsules (20-40 mg total) by mouth daily. 180 capsule 3  . rizatriptan (MAXALT) 10 MG tablet Take 1 tablet (10 mg total) by mouth as needed. May repeat in 2 hours if needed 36 tablet 4  . valACYclovir (VALTREX) 1000 MG tablet Take 1 tablet (1,000 mg total) by mouth 2 (two) times daily. When needed for outbreatk 30 tablet 1  . zolpidem (AMBIEN) 5 MG tablet Take 1 tablet (5 mg total) by mouth at bedtime as needed for sleep. 15 tablet 1  . diclofenac sodium (VOLTAREN) 1 % GEL Apply 4 g topically 4 (four) times daily. To affected joint. 100 g 11   No facility-administered medications prior to visit.    Allergies  Allergen Reactions  . Banana Hives  . Clonazepam Other (See Comments)    Makes her feel weird.   . Zolpidem Other (See Comments)    Nightmares    ROS Review of Systems    Objective:    Physical Exam Vitals reviewed.  Constitutional:      Appearance: She is well-developed.  HENT:     Head: Normocephalic and atraumatic.  Eyes:     Conjunctiva/sclera: Conjunctivae normal.  Cardiovascular:     Rate and Rhythm: Normal rate.  Pulmonary:     Effort: Pulmonary effort is normal.  Musculoskeletal:     Comments: Erythematous maculopapular very rough textured rash diffuse across her upper chest little bit more scattered on her upper shoulders a little bit going on the sides of her facial cheeks she has a little bit of erythema underneath both eyes but no distinct rash in that area.   Skin:    General: Skin is dry.     Coloration: Skin is not pale.  Neurological:     Mental Status: She is alert and oriented to person, place, and time.  Psychiatric:        Behavior: Behavior normal.     BP 132/75  Pulse 78   Ht 5\' 2"  (1.575 m)   Wt 238 lb (108 kg)   SpO2 98%   BMI 43.53 kg/m  Wt Readings from Last 3 Encounters:  05/09/20 238 lb (108 kg)  03/01/20 232 lb (105.2 kg)  01/04/20 229 lb (103.9 kg)     Health Maintenance Due  Topic Date Due  . COLONOSCOPY (Pts 45-36yrs Insurance coverage will need to be confirmed)  02/20/2015  . COVID-19 Vaccine (3 - Booster for Pfizer series) 11/03/2019    There are no preventive care reminders to display for this patient.  Lab Results  Component Value Date   TSH 2.74 10/09/2019   Lab Results  Component Value Date   WBC 8.2 10/09/2019   HGB 12.1 10/09/2019   HCT 36.1 10/09/2019   MCV 88.7 10/09/2019   PLT 318 10/09/2019   Lab Results  Component Value Date   NA 142 10/09/2019   K 4.2 10/09/2019   CO2 26 10/09/2019   GLUCOSE 94 10/09/2019   BUN 17 10/09/2019   CREATININE 0.97 10/09/2019   BILITOT 0.4 10/09/2019   ALKPHOS 74 11/01/2015   AST 15 10/09/2019   ALT 20 10/09/2019   PROT 6.6 10/09/2019   ALBUMIN 4.1 11/01/2015   CALCIUM 9.1 10/09/2019   Lab Results  Component Value Date   CHOL 154 10/09/2019   Lab Results  Component Value Date   HDL 41 (L) 10/09/2019   Lab Results  Component Value Date   LDLCALC 93 10/09/2019   Lab Results  Component Value Date   TRIG 102 10/09/2019   Lab Results  Component Value Date   CHOLHDL 3.8 10/09/2019   Lab Results  Component Value Date   HGBA1C 5.2 10/09/2019      Assessment & Plan:   Problem List Items Addressed This Visit      Other   Complicated grief - Primary    Discussed options.  We will continue with Cymbalta.  We will add Wellbutrin to see if this helps with energy and focus.  If this is not effective when I see her back then consider  changing the Cymbalta to a different SSRI.  She has been managing to work from home even though its been very difficult.  So we will get up-to-date paperwork so that she can continue to work from home from for now.  I think a return date is in about 2 weeks.  Plan to follow-up in 3 weeks.  She has not heard from the therapy/counseling referral that we placed so I will place a new one today.      Relevant Orders   Ambulatory referral to Behavioral Health    Other Visit Diagnoses    Rash       Relevant Medications   methylPREDNISolone sodium succinate (SOLU-MEDROL) 125 mg/2 mL injection 125 mg (Completed)   Medication side effect          Rash-unclear etiology.  It looks most more like a contact dermatitis she denies any significant sun exposure though it does seem to be in more sun exposed areas.  She started getting some lip tingling while here in the office so we decided go ahead and give her Solu-Medrol 125 IM and will start 5 days of prednisone.  If not improving or if the rash returns then please let 10/11/2019 know.  Meds ordered this encounter  Medications  . predniSONE (DELTASONE) 20 MG tablet    Sig: Take 2 tablets (40 mg total) by mouth  daily with breakfast.    Dispense:  10 tablet    Refill:  0  . buPROPion (WELLBUTRIN XL) 150 MG 24 hr tablet    Sig: Take 1 tablet (150 mg total) by mouth every morning.    Dispense:  30 tablet    Refill:  0  . methylPREDNISolone sodium succinate (SOLU-MEDROL) 125 mg/2 mL injection 125 mg  . diclofenac Sodium (VOLTAREN) 1 % GEL    Sig: Apply 4 g topically 4 (four) times daily.    Dispense:  350 g    Refill:  11    Follow-up: Return in about 3 weeks (around 05/30/2020) for New start medication.    Nani Gasser, MD

## 2020-05-13 DIAGNOSIS — L579 Skin changes due to chronic exposure to nonionizing radiation, unspecified: Secondary | ICD-10-CM | POA: Diagnosis not present

## 2020-05-13 DIAGNOSIS — R21 Rash and other nonspecific skin eruption: Secondary | ICD-10-CM | POA: Diagnosis not present

## 2020-05-16 ENCOUNTER — Telehealth: Payer: Self-pay

## 2020-05-16 NOTE — Telephone Encounter (Signed)
PA sent for diclofenac Sodium (VOLTAREN) 1 % GEL. Awaiting response.

## 2020-05-21 ENCOUNTER — Other Ambulatory Visit: Payer: Self-pay | Admitting: Family Medicine

## 2020-06-03 DIAGNOSIS — L930 Discoid lupus erythematosus: Secondary | ICD-10-CM | POA: Diagnosis not present

## 2020-06-04 ENCOUNTER — Other Ambulatory Visit: Payer: Self-pay

## 2020-06-04 ENCOUNTER — Encounter: Payer: Self-pay | Admitting: Family Medicine

## 2020-06-04 ENCOUNTER — Ambulatory Visit: Payer: BC Managed Care – PPO | Admitting: Family Medicine

## 2020-06-04 VITALS — BP 123/70 | HR 90 | Ht 62.0 in | Wt 238.0 lb

## 2020-06-04 DIAGNOSIS — B372 Candidiasis of skin and nail: Secondary | ICD-10-CM | POA: Diagnosis not present

## 2020-06-04 DIAGNOSIS — F4321 Adjustment disorder with depressed mood: Secondary | ICD-10-CM | POA: Diagnosis not present

## 2020-06-04 MED ORDER — NYSTATIN 100000 UNIT/GM EX POWD: CUTANEOUS | 2 refills | Status: AC

## 2020-06-04 MED ORDER — NYSTATIN 100000 UNIT/GM EX OINT: 1.0000 "application " | TOPICAL_OINTMENT | Freq: Two times a day (BID) | CUTANEOUS | 2 refills | Status: DC

## 2020-06-04 NOTE — Progress Notes (Signed)
Established Patient Office Visit  Subjective:  Patient ID: Kristen Spence, female    DOB: September 28, 1960  Age: 60 y.o. MRN: [id number]  CC:  Chief Complaint  Patient presents with  . Follow-up    HPI Kristen Spence presents for f/u grief-overall she says she has had a few better days where she does not want to cry all day.  She says she is keeping up with housework okay.  She has some days where she is not motivated at all and all she can do is work.  She does try to make her self go outside at least once a day.  She said she did have 2 days last week where she felt a little bit more motivation and was able to get some things done around the house.  She does feel like the addition of Wellbutrin has been helpful she has not noticed any negative effect she is currently on 150 mg daily she does not want to make any changes today.  She did hear back about the referral for counseling there supposed to be sending her paperwork she has not gotten it yet.  She did see the dermatologist for the rash on her upper chest and upper arms.  They did actually did a biopsy and did blood work to rule out lupus which was negative.  They gave her some oral steroids again which does seem to help when she takes it.  It just gets itchy when she feels hot.  She is also been using her nystatin powder on the groin area and wonders if there is also a cream that she could maybe alternate with.  Past Medical History:  Diagnosis Date  . Benign positional vertigo   . Dizziness   . Edema   . Fatigue   . GERD (gastroesophageal reflux disease)    hiatal hernia  . Heart palpitations   . Hiatal hernia   . Hypertension   . Insomnia   . Mental disorder   . Migraine     Past Surgical History:  Procedure Laterality Date  . BREAST BIOPSY    . BREAST EXCISIONAL BIOPSY Left   . BREAST LUMPECTOMY  2000   . CESAREAN SECTION  1982  . CHOLECYSTECTOMY  1987  . ENDOMETRIAL ABLATION  09/30/2006  .  ESOPHAGOGASTRODUODENOSCOPY     Showed grade D. reflux esophagitis  . TOTAL VAGINAL HYSTERECTOMY  09/27/2008   Dr. Lavon Paganini    Family History  Problem Relation Age of Onset  . Alcohol abuse Father   . Stroke Father   . Hypertension Father   . Ovarian cancer Mother   . Depression Mother   . Diabetes Mother   . Hyperlipidemia Mother   . Hypertension Mother   . COPD Mother   . Heart attack Sister 23  . Breast cancer Sister     Social History   Socioeconomic History  . Marital status: Married    Spouse name: bobby   . Number of children: 2  . Years of education: 10th grade  . Highest education level: Not on file  Occupational History  . Occupation:      Employer: [employer]  Tobacco Use  . Smoking status: Never Smoker  . Smokeless tobacco: Never Used  Substance and Sexual Activity  . Alcohol use: No  . Drug use: No  . Sexual activity: Never    Birth control/protection: Surgical  Other Topics Concern  . Not on file  Social History Narrative  No regular exercise   Social Determinants of Health   Financial Resource Strain: Not on file  Food Insecurity: Not on file  Transportation Needs: Not on file  Physical Activity: Not on file  Stress: Not on file  Social Connections: Not on file  Intimate Partner Violence: Not on file    Outpatient Medications Prior to Visit  Medication Sig Dispense Refill  . ALPRAZolam (XANAX) 0.25 MG tablet Take 1 tablet (0.25 mg total) by mouth daily as needed for anxiety. 90 day supply 60 tablet 0  . buPROPion (WELLBUTRIN XL) 150 MG 24 hr tablet Take 1 tablet (150 mg total) by mouth every morning. 30 tablet 0  . cholecalciferol (VITAMIN D) 1000 units tablet Take 2,000 Units by mouth daily.    . diclofenac Sodium (VOLTAREN) 1 % GEL APPLY 4 G TOPICALLY 4 TIMES DAILY 300 g 11  . DULoxetine (CYMBALTA) 60 MG capsule Take 1 capsule (60 mg total) by mouth 2 (two) times daily. 180 capsule 1  . gabapentin (NEURONTIN) 300 MG capsule  Take 2 capsules (600 mg total) by mouth 3 (three) times daily. 540 capsule 3  . ipratropium (ATROVENT) 0.03 % nasal spray Place 2 sprays into both nostrils every 12 (twelve) hours. 30 mL 2  . lisinopril-hydrochlorothiazide (ZESTORETIC) 20-25 MG tablet Take 1 tablet by mouth daily. 90 tablet 1  . meloxicam (MOBIC) 15 MG tablet TAKE 1 TABLET (15 MG TOTAL) BY MOUTH DAILY. PRN 90 tablet 1  . montelukast (SINGULAIR) 10 MG tablet TAKE 1 TABLET BY MOUTH EVERYDAY AT BEDTIME 90 tablet 3  . omeprazole (PRILOSEC) 20 MG capsule Take 1-2 capsules (20-40 mg total) by mouth daily. 180 capsule 3  . rizatriptan (MAXALT) 10 MG tablet Take 1 tablet (10 mg total) by mouth as needed. May repeat in 2 hours if needed 36 tablet 4  . valACYclovir (VALTREX) 1000 MG tablet Take 1 tablet (1,000 mg total) by mouth 2 (two) times daily. When needed for outbreatk 30 tablet 1  . zolpidem (AMBIEN) 5 MG tablet Take 1 tablet (5 mg total) by mouth at bedtime as needed for sleep. 15 tablet 1  . nystatin (MYCOSTATIN/NYSTOP) powder Apply to affected area 3 times daily 60 g 2  . predniSONE (DELTASONE) 20 MG tablet Take 2 tablets (40 mg total) by mouth daily with breakfast. 10 tablet 0   No facility-administered medications prior to visit.    Allergies  Allergen Reactions  . Banana Hives  . Clonazepam Other (See Comments)    Makes her feel weird.   . Zolpidem Other (See Comments)    Nightmares    ROS Review of Systems    Objective:    Physical Exam  BP 123/70   Pulse 90   Ht 5\' 2"  (1.575 m)   Wt 238 lb (108 kg)   SpO2 98%   BMI 43.53 kg/m  Wt Readings from Last 3 Encounters:  06/04/20 238 lb (108 kg)  05/09/20 238 lb (108 kg)  03/01/20 232 lb (105.2 kg)     Health Maintenance Due  Topic Date Due  . COLONOSCOPY (Pts 45-12yrs Insurance coverage will need to be confirmed)  02/20/2015  . COVID-19 Vaccine (3 - Booster for Pfizer series) 10/04/2019    There are no preventive care reminders to display for this  patient.  Lab Results  Component Value Date   TSH 2.74 10/09/2019   Lab Results  Component Value Date   WBC 8.2 10/09/2019   HGB 12.1 10/09/2019   HCT 36.1  10/09/2019   MCV 88.7 10/09/2019   PLT 318 10/09/2019   Lab Results  Component Value Date   NA 142 10/09/2019   K 4.2 10/09/2019   CO2 26 10/09/2019   GLUCOSE 94 10/09/2019   BUN 17 10/09/2019   CREATININE 0.97 10/09/2019   BILITOT 0.4 10/09/2019   ALKPHOS 74 11/01/2015   AST 15 10/09/2019   ALT 20 10/09/2019   PROT 6.6 10/09/2019   ALBUMIN 4.1 11/01/2015   CALCIUM 9.1 10/09/2019   Lab Results  Component Value Date   CHOL 154 10/09/2019   Lab Results  Component Value Date   HDL 41 (L) 10/09/2019   Lab Results  Component Value Date   LDLCALC 93 10/09/2019   Lab Results  Component Value Date   TRIG 102 10/09/2019   Lab Results  Component Value Date   CHOLHDL 3.8 10/09/2019   Lab Results  Component Value Date   HGBA1C 5.2 10/09/2019      Assessment & Plan:   Problem List Items Addressed This Visit      Other   Complicated grief    Did encourage her to call behavioral health back if she does not receive her paperwork in the mail by the end of this week.  They might even be able to email it to her which might expedite things.  PHQ-9 score of 11 and GAD-7 score of 16.  She says right now she is struggling with a lot of anxiety around her son.  She says really the only person she has left in her life that she really cares about and so she has been very fearful that something is going to happen to him.  We will continue with Wellbutrin at 150 mg and continue with Cymbalta as well.  We will plan to follow-up after she starts her therapy/counseling.  We did go ahead and complete forms earlier this week for her to continue to work from home full-time until August 15.       Other Visit Diagnoses    Skin yeast infection    -  Primary   Relevant Medications   nystatin ointment (MYCOSTATIN)   nystatin  (MYCOSTATIN/NYSTOP) powder      Meds ordered this encounter  Medications  . nystatin ointment (MYCOSTATIN)    Sig: Apply 1 application topically 2 (two) times daily.    Dispense:  30 g    Refill:  2  . nystatin (MYCOSTATIN/NYSTOP) powder    Sig: Apply to affected area 3 times daily    Dispense:  60 g    Refill:  2    Follow-up: No follow-ups on file.   I spent 32 minutes on the day of the encounter to include pre-visit record review, face-to-face time with the patient and post visit ordering of test.   Nani Gasser, MD

## 2020-06-04 NOTE — Progress Notes (Signed)
Pt stated that she hasn't been seen by [facility] she was told that she would have to have forms completed before she could be seen and that the appointments are virtual until July.

## 2020-06-04 NOTE — Assessment & Plan Note (Signed)
Did encourage her to call behavioral health back if she does not receive her paperwork in the mail by the end of this week.  They might even be able to email it to her which might expedite things.  PHQ-9 score of 11 and GAD-7 score of 16.  She says right now she is struggling with a lot of anxiety around her son.  She says really the only person she has left in her life that she really cares about and so she has been very fearful that something is going to happen to him.  We will continue with Wellbutrin at 150 mg and continue with Cymbalta as well.  We will plan to follow-up after she starts her therapy/counseling.  We did go ahead and complete forms earlier this week for her to continue to work from home full-time until August 15.

## 2020-06-06 ENCOUNTER — Other Ambulatory Visit: Payer: Self-pay | Admitting: Family Medicine

## 2020-06-07 DIAGNOSIS — R9431 Abnormal electrocardiogram [ECG] [EKG]: Secondary | ICD-10-CM | POA: Diagnosis not present

## 2020-06-07 DIAGNOSIS — R7989 Other specified abnormal findings of blood chemistry: Secondary | ICD-10-CM | POA: Diagnosis not present

## 2020-06-07 DIAGNOSIS — R0789 Other chest pain: Secondary | ICD-10-CM | POA: Diagnosis not present

## 2020-06-07 DIAGNOSIS — Z79899 Other long term (current) drug therapy: Secondary | ICD-10-CM | POA: Diagnosis not present

## 2020-06-07 DIAGNOSIS — Z888 Allergy status to other drugs, medicaments and biological substances status: Secondary | ICD-10-CM | POA: Diagnosis not present

## 2020-06-07 DIAGNOSIS — Z91018 Allergy to other foods: Secondary | ICD-10-CM | POA: Diagnosis not present

## 2020-06-07 DIAGNOSIS — I10 Essential (primary) hypertension: Secondary | ICD-10-CM | POA: Diagnosis not present

## 2020-06-07 DIAGNOSIS — R079 Chest pain, unspecified: Secondary | ICD-10-CM | POA: Diagnosis not present

## 2020-06-21 ENCOUNTER — Telehealth: Payer: Self-pay | Admitting: *Deleted

## 2020-06-21 NOTE — Telephone Encounter (Signed)
LVM advising pt that I did receive her forms for the FMLA.   I asked that she either call back or send email about whether or not she has started counseling. This information will be helpful.

## 2020-06-30 ENCOUNTER — Other Ambulatory Visit: Payer: Self-pay | Admitting: Family Medicine

## 2020-06-30 DIAGNOSIS — F4321 Adjustment disorder with depressed mood: Secondary | ICD-10-CM

## 2020-07-03 DIAGNOSIS — L931 Subacute cutaneous lupus erythematosus: Secondary | ICD-10-CM | POA: Diagnosis not present

## 2020-07-03 DIAGNOSIS — L930 Discoid lupus erythematosus: Secondary | ICD-10-CM | POA: Diagnosis not present

## 2020-08-02 ENCOUNTER — Ambulatory Visit: Payer: BC Managed Care – PPO | Admitting: Professional

## 2020-08-08 ENCOUNTER — Ambulatory Visit (INDEPENDENT_AMBULATORY_CARE_PROVIDER_SITE_OTHER): Payer: BC Managed Care – PPO | Admitting: Professional

## 2020-08-08 DIAGNOSIS — F321 Major depressive disorder, single episode, moderate: Secondary | ICD-10-CM | POA: Diagnosis not present

## 2020-08-15 ENCOUNTER — Ambulatory Visit (INDEPENDENT_AMBULATORY_CARE_PROVIDER_SITE_OTHER): Payer: BC Managed Care – PPO | Admitting: Professional

## 2020-08-15 DIAGNOSIS — F321 Major depressive disorder, single episode, moderate: Secondary | ICD-10-CM | POA: Diagnosis not present

## 2020-08-28 ENCOUNTER — Ambulatory Visit: Payer: BC Managed Care – PPO | Admitting: Professional

## 2020-09-02 DIAGNOSIS — L299 Pruritus, unspecified: Secondary | ICD-10-CM | POA: Diagnosis not present

## 2020-09-02 DIAGNOSIS — L209 Atopic dermatitis, unspecified: Secondary | ICD-10-CM | POA: Diagnosis not present

## 2020-09-05 ENCOUNTER — Other Ambulatory Visit: Payer: Self-pay | Admitting: Family Medicine

## 2020-09-05 ENCOUNTER — Ambulatory Visit: Payer: BC Managed Care – PPO | Admitting: Family Medicine

## 2020-09-06 ENCOUNTER — Other Ambulatory Visit: Payer: Self-pay | Admitting: Family Medicine

## 2020-09-06 DIAGNOSIS — F4321 Adjustment disorder with depressed mood: Secondary | ICD-10-CM

## 2020-09-12 ENCOUNTER — Ambulatory Visit: Payer: BC Managed Care – PPO | Admitting: Professional

## 2020-09-26 ENCOUNTER — Other Ambulatory Visit: Payer: Self-pay | Admitting: *Deleted

## 2020-09-26 ENCOUNTER — Telehealth: Payer: Self-pay | Admitting: *Deleted

## 2020-09-26 ENCOUNTER — Other Ambulatory Visit: Payer: Self-pay

## 2020-09-26 ENCOUNTER — Ambulatory Visit (INDEPENDENT_AMBULATORY_CARE_PROVIDER_SITE_OTHER): Payer: BC Managed Care – PPO | Admitting: Professional

## 2020-09-26 ENCOUNTER — Other Ambulatory Visit: Payer: Self-pay | Admitting: Family Medicine

## 2020-09-26 DIAGNOSIS — F321 Major depressive disorder, single episode, moderate: Secondary | ICD-10-CM

## 2020-09-26 MED ORDER — DICLOFENAC SODIUM 1 % EX GEL: CUTANEOUS | 11 refills | Status: DC
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
## 2020-09-27 NOTE — Telephone Encounter (Signed)
Called pt and advised her that I spoke w/the pharmacy and her prescription will not be released until 9/11.   She stated that this is fine and she will p/u then.

## 2020-09-27 NOTE — Telephone Encounter (Signed)
It looks like we already refilled on 823 with a note from the pharmacy that the prescription had been accidentally thrown away.  Did she pick up that 1?

## 2020-09-28 ENCOUNTER — Other Ambulatory Visit: Payer: Self-pay | Admitting: Nurse Practitioner

## 2020-10-09 ENCOUNTER — Ambulatory Visit (INDEPENDENT_AMBULATORY_CARE_PROVIDER_SITE_OTHER): Payer: BC Managed Care – PPO | Admitting: Professional

## 2020-10-09 DIAGNOSIS — F321 Major depressive disorder, single episode, moderate: Secondary | ICD-10-CM

## 2020-10-16 ENCOUNTER — Other Ambulatory Visit: Payer: Self-pay | Admitting: Family Medicine

## 2020-10-16 ENCOUNTER — Telehealth: Payer: Self-pay

## 2020-10-16 ENCOUNTER — Other Ambulatory Visit: Payer: Self-pay | Admitting: *Deleted

## 2020-10-16 ENCOUNTER — Other Ambulatory Visit: Payer: Self-pay | Admitting: Nurse Practitioner

## 2020-10-16 DIAGNOSIS — K2100 Gastro-esophageal reflux disease with esophagitis, without bleeding: Secondary | ICD-10-CM

## 2020-10-16 MED ORDER — OMEPRAZOLE 20 MG PO CPDR: 20.0000 mg | DELAYED_RELEASE_CAPSULE | Freq: Every day | ORAL | 3 refills | Status: DC

## 2020-10-16 NOTE — Telephone Encounter (Signed)
Medication: diclofenac Sodium (VOLTAREN) 1 % GEL Prior authorization determination received Medication has been approved Approval dates: 10/16/2020-10/16/2023  Patient aware via: MyChart Pharmacy aware: Yes Provider aware via this encounter

## 2020-10-16 NOTE — Telephone Encounter (Signed)
Medication: diclofenac Sodium (VOLTAREN) 1 % GEL Prior authorization submitted via CoverMyMeds on 10/16/2020 PA submission pending

## 2020-10-25 ENCOUNTER — Ambulatory Visit: Payer: BC Managed Care – PPO | Admitting: Professional

## 2020-10-28 ENCOUNTER — Ambulatory Visit: Payer: BC Managed Care – PPO | Admitting: Family Medicine

## 2020-10-28 ENCOUNTER — Encounter: Payer: Self-pay | Admitting: Family Medicine

## 2020-10-28 VITALS — BP 140/77 | HR 66 | Ht 62.0 in | Wt 239.0 lb

## 2020-10-28 DIAGNOSIS — F4321 Adjustment disorder with depressed mood: Secondary | ICD-10-CM

## 2020-10-28 DIAGNOSIS — I10 Essential (primary) hypertension: Secondary | ICD-10-CM

## 2020-10-28 DIAGNOSIS — K2100 Gastro-esophageal reflux disease with esophagitis, without bleeding: Secondary | ICD-10-CM

## 2020-10-28 DIAGNOSIS — G4709 Other insomnia: Secondary | ICD-10-CM | POA: Diagnosis not present

## 2020-10-28 MED ORDER — BUPROPION HCL ER (XL) 150 MG PO TB24: 150.0000 mg | ORAL_TABLET | Freq: Every day | ORAL | 3 refills | Status: DC

## 2020-10-28 MED ORDER — LISINOPRIL-HYDROCHLOROTHIAZIDE 20-25 MG PO TABS: 1.0000 | ORAL_TABLET | Freq: Every day | ORAL | 1 refills | Status: DC

## 2020-10-28 MED ORDER — DULOXETINE HCL 60 MG PO CPEP: 60.0000 mg | ORAL_CAPSULE | Freq: Every day | ORAL | 1 refills | Status: DC

## 2020-10-28 NOTE — Assessment & Plan Note (Addendum)
He is working with her therapist every 2 weeks and finds it has been really helpful its been a year since her husband passed and she feels like she is finally starting to make some headway.  Her son calls and checks on her every day.

## 2020-10-28 NOTE — Assessment & Plan Note (Signed)
Takes her PPI twice a day.  We discussed really getting some weight off and I think that would make a big difference and she might even be able to come down on her medication to once a day.

## 2020-10-28 NOTE — Assessment & Plan Note (Signed)
She no longer uses the Ambien.

## 2020-10-28 NOTE — Assessment & Plan Note (Signed)
Pressure little borderline elevated today at 140.  We will definitely keep an eye on this and make sure that it is coming down when I see her back.  Do to get updated blood work.

## 2020-10-28 NOTE — Progress Notes (Signed)
Established Patient Office Visit  Subjective:  Patient ID: Kristen Spence, female    DOB: [birth year]  Age: 60 y.o. MRN: [id number]  CC:  Chief Complaint  Patient presents with   Follow-up    HPI Kristen Spence presents for   Hypertension- Pt denies chest pain, SOB, dizziness, or heart palpitations.  Taking meds as directed w/o problems.  Denies medication side effects.    F/U Grief -overall she feels like she is making a little progress.  She is been seeing her counselor twice a month and that has been helpful.  Past Medical History:  Diagnosis Date   Benign positional vertigo    Dizziness    Edema    Fatigue    GERD (gastroesophageal reflux disease)    hiatal hernia   Heart palpitations    Hiatal hernia    Hypertension    Insomnia    Mental disorder    Migraine     Past Surgical History:  Procedure Laterality Date   BREAST BIOPSY     BREAST EXCISIONAL BIOPSY Left    BREAST LUMPECTOMY  2000    CESAREAN SECTION  1982   CHOLECYSTECTOMY  1987   ENDOMETRIAL ABLATION  09/30/2006   ESOPHAGOGASTRODUODENOSCOPY     Showed grade D. reflux esophagitis   TOTAL VAGINAL HYSTERECTOMY  09/27/2008   Dr. Lavon Paganini    Family History  Problem Relation Age of Onset   Alcohol abuse Father    Stroke Father    Hypertension Father    Ovarian cancer Mother    Depression Mother    Diabetes Mother    Hyperlipidemia Mother    Hypertension Mother    COPD Mother    Heart attack Sister 75   Breast cancer Sister     Social History   Socioeconomic History   Marital status: Married    Spouse name: bobby    Number of children: 2   Years of education: 10th grade   Highest education level: Not on file  Occupational History   Occupation:      Employer: [employer]  Tobacco Use   Smoking status: Never   Smokeless tobacco: Never  Substance and Sexual Activity   Alcohol use: No   Drug use: No   Sexual activity: Never    Birth control/protection: Surgical   Other Topics Concern   Not on file  Social History Narrative   No regular exercise   Social Determinants of Health   Financial Resource Strain: Not on file  Food Insecurity: Not on file  Transportation Needs: Not on file  Physical Activity: Not on file  Stress: Not on file  Social Connections: Not on file  Intimate Partner Violence: Not on file    Outpatient Medications Prior to Visit  Medication Sig Dispense Refill   ALPRAZolam (XANAX) 0.25 MG tablet TAKE 1 TABLET (0.25 MG TOTAL) BY MOUTH DAILY AS NEEDED FOR ANXIETY. 90 DAY SUPPLY 90 tablet 0   cholecalciferol (VITAMIN D) 1000 units tablet Take 2,000 Units by mouth daily.     diclofenac Sodium (VOLTAREN) 1 % GEL APPLY 4 G TOPICALLY 4 TIMES DAILY 300 g 11   gabapentin (NEURONTIN) 300 MG capsule Take 2 capsules (600 mg total) by mouth 3 (three) times daily. 540 capsule 3   ipratropium (ATROVENT) 0.03 % nasal spray Place 2 sprays into both nostrils every 12 (twelve) hours. 30 mL 2   meloxicam (MOBIC) 15 MG tablet TAKE 1 TABLET (15 MG TOTAL) BY MOUTH  DAILY. PRN 90 tablet 1   montelukast (SINGULAIR) 10 MG tablet TAKE 1 TABLET BY MOUTH EVERYDAY AT BEDTIME 90 tablet 3   nystatin (MYCOSTATIN/NYSTOP) powder Apply to affected area 3 times daily 60 g 2   nystatin ointment (MYCOSTATIN) Apply 1 application topically 2 (two) times daily. 30 g 2   omeprazole (PRILOSEC) 20 MG capsule Take 1-2 capsules (20-40 mg total) by mouth daily. TAKE 1-2 CAPSULES (20-40 MG TOTAL) BY MOUTH DAILY. 180 capsule 3   rizatriptan (MAXALT) 10 MG tablet Take 1 tablet (10 mg total) by mouth as needed. May repeat in 2 hours if needed 36 tablet 4   valACYclovir (VALTREX) 1000 MG tablet Take 1 tablet (1,000 mg total) by mouth 2 (two) times daily. When needed for outbreatk 30 tablet 1   buPROPion (WELLBUTRIN XL) 150 MG 24 hr tablet TAKE 1 TABLET BY MOUTH EVERY DAY IN THE MORNING 90 tablet 0   DULoxetine (CYMBALTA) 60 MG capsule Take 1 capsule (60 mg total) by mouth 2 (two)  times daily. 180 capsule 1   lisinopril-hydrochlorothiazide (ZESTORETIC) 20-25 MG tablet Take 1 tablet by mouth daily. 90 tablet 1   zolpidem (AMBIEN) 5 MG tablet Take 1 tablet (5 mg total) by mouth at bedtime as needed for sleep. 15 tablet 1   No facility-administered medications prior to visit.    Allergies  Allergen Reactions   Banana Hives   Clonazepam Other (See Comments)    Makes her feel weird.    Zolpidem Other (See Comments)    Nightmares    ROS Review of Systems    Objective:    Physical Exam Constitutional:      Appearance: Normal appearance. She is well-developed.  HENT:     Head: Normocephalic and atraumatic.  Cardiovascular:     Rate and Rhythm: Normal rate and regular rhythm.     Heart sounds: Normal heart sounds.  Pulmonary:     Effort: Pulmonary effort is normal.     Breath sounds: Normal breath sounds.  Skin:    General: Skin is warm and dry.  Neurological:     Mental Status: She is alert and oriented to person, place, and time.  Psychiatric:        Behavior: Behavior normal.    BP 140/77   Pulse 66   Ht 5\' 2"  (1.575 m)   Wt 239 lb (108.4 kg)   SpO2 96%   BMI 43.71 kg/m  Wt Readings from Last 3 Encounters:  10/28/20 239 lb (108.4 kg)  06/04/20 238 lb (108 kg)  05/09/20 238 lb (108 kg)     Health Maintenance Due  Topic Date Due   COLONOSCOPY (Pts 45-68yrs Insurance coverage will need to be confirmed)  02/20/2015   COVID-19 Vaccine (3 - Booster for Pfizer series) 10/04/2019   INFLUENZA VACCINE  08/19/2020   MAMMOGRAM  08/30/2020    There are no preventive care reminders to display for this patient.  Lab Results  Component Value Date   TSH 2.74 10/09/2019   Lab Results  Component Value Date   WBC 6.2 10/28/2020   HGB 12.8 10/28/2020   HCT 39.4 10/28/2020   MCV 91.0 10/28/2020   PLT 282 10/28/2020   Lab Results  Component Value Date   NA 142 10/09/2019   K 4.2 10/09/2019   CO2 26 10/09/2019   GLUCOSE 94 10/09/2019   BUN  17 10/09/2019   CREATININE 0.97 10/09/2019   BILITOT 0.4 10/09/2019   ALKPHOS 74 11/01/2015  AST 15 10/09/2019   ALT 20 10/09/2019   PROT 6.6 10/09/2019   ALBUMIN 4.1 11/01/2015   CALCIUM 9.1 10/09/2019   Lab Results  Component Value Date   CHOL 154 10/09/2019   Lab Results  Component Value Date   HDL 41 (L) 10/09/2019   Lab Results  Component Value Date   LDLCALC 93 10/09/2019   Lab Results  Component Value Date   TRIG 102 10/09/2019   Lab Results  Component Value Date   CHOLHDL 3.8 10/09/2019   Lab Results  Component Value Date   HGBA1C 5.2 10/09/2019      Assessment & Plan:   Problem List Items Addressed This Visit       Cardiovascular and Mediastinum   HYPERTENSION, BENIGN - Primary    Pressure little borderline elevated today at 140.  We will definitely keep an eye on this and make sure that it is coming down when I see her back.  Do to get updated blood work.      Relevant Medications   lisinopril-hydrochlorothiazide (ZESTORETIC) 20-25 MG tablet   Other Relevant Orders   Lipid Panel w/reflex Direct LDL   COMPLETE METABOLIC PANEL WITH GFR   CBC (Completed)     Digestive   Gastroesophageal reflux disease    Takes her PPI twice a day.  We discussed really getting some weight off and I think that would make a big difference and she might even be able to come down on her medication to once a day.        Other   Other insomnia    She no longer uses the Ambien.      Complicated grief    He is working with her therapist every 2 weeks and finds it has been really helpful its been a year since her husband passed and she feels like she is finally starting to make some headway.  Her son calls and checks on her every day.      Relevant Medications   DULoxetine (CYMBALTA) 60 MG capsule    Meds ordered this encounter  Medications   buPROPion (WELLBUTRIN XL) 150 MG 24 hr tablet    Sig: Take 1 tablet (150 mg total) by mouth daily.    Dispense:  90  tablet    Refill:  3   lisinopril-hydrochlorothiazide (ZESTORETIC) 20-25 MG tablet    Sig: Take 1 tablet by mouth daily.    Dispense:  90 tablet    Refill:  1   DULoxetine (CYMBALTA) 60 MG capsule    Sig: Take 1 capsule (60 mg total) by mouth daily.    Dispense:  90 capsule    Refill:  1     Follow-up: Return in about 6 months (around 04/28/2021) for Hypertension.    Nani Gasser, MD

## 2020-10-29 LAB — CBC
HCT: 39.4 % (ref 35.0–45.0)
Hemoglobin: 12.8 g/dL (ref 11.7–15.5)
MCH: 29.6 pg (ref 27.0–33.0)
MCHC: 32.5 g/dL (ref 32.0–36.0)
MCV: 91 fL (ref 80.0–100.0)
MPV: 9.6 fL (ref 7.5–12.5)
Platelets: 282 10*3/uL (ref 140–400)
RBC: 4.33 10*6/uL (ref 3.80–5.10)
RDW: 13.1 % (ref 11.0–15.0)
WBC: 6.2 10*3/uL (ref 3.8–10.8)

## 2020-10-29 LAB — COMPLETE METABOLIC PANEL WITH GFR
AG Ratio: 1.6 (calc) (ref 1.0–2.5)
ALT: 19 U/L (ref 6–29)
AST: 15 U/L (ref 10–35)
Albumin: 4.1 g/dL (ref 3.6–5.1)
Alkaline phosphatase (APISO): 53 U/L (ref 37–153)
BUN: 15 mg/dL (ref 7–25)
CO2: 29 mmol/L (ref 20–32)
Calcium: 9.4 mg/dL (ref 8.6–10.4)
Chloride: 109 mmol/L (ref 98–110)
Creat: 0.96 mg/dL (ref 0.50–1.03)
Globulin: 2.5 g/dL (calc) (ref 1.9–3.7)
Glucose, Bld: 90 mg/dL (ref 65–99)
Potassium: 4.5 mmol/L (ref 3.5–5.3)
Sodium: 143 mmol/L (ref 135–146)
Total Bilirubin: 0.6 mg/dL (ref 0.2–1.2)
Total Protein: 6.6 g/dL (ref 6.1–8.1)
eGFR: 69 mL/min/{1.73_m2} (ref >=60)

## 2020-10-29 LAB — LIPID PANEL W/REFLEX DIRECT LDL
Cholesterol: 163 mg/dL (ref <200)
HDL: 48 mg/dL — ABNORMAL LOW (ref >=50)
LDL Cholesterol (Calc): 97 mg/dL (calc)
Non-HDL Cholesterol (Calc): 115 mg/dL (calc) (ref <130)
Total CHOL/HDL Ratio: 3.4 (calc) (ref <5.0)
Triglycerides: 86 mg/dL (ref <150)

## 2020-10-29 NOTE — Progress Notes (Signed)
Hi Kevina, total cholesterol and LDL look good.  Your metabolic panel and blood count are also normal.

## 2020-11-04 ENCOUNTER — Other Ambulatory Visit: Payer: Self-pay | Admitting: Family Medicine

## 2020-11-04 DIAGNOSIS — Z1231 Encounter for screening mammogram for malignant neoplasm of breast: Secondary | ICD-10-CM

## 2020-11-07 ENCOUNTER — Ambulatory Visit (INDEPENDENT_AMBULATORY_CARE_PROVIDER_SITE_OTHER): Payer: BC Managed Care – PPO

## 2020-11-07 ENCOUNTER — Other Ambulatory Visit: Payer: Self-pay

## 2020-11-07 DIAGNOSIS — Z1231 Encounter for screening mammogram for malignant neoplasm of breast: Secondary | ICD-10-CM | POA: Diagnosis not present

## 2020-11-11 NOTE — Progress Notes (Signed)
Please call patient. Normal mammogram.  Repeat in 1 year.  

## 2020-12-17 DIAGNOSIS — D2371 Other benign neoplasm of skin of right lower limb, including hip: Secondary | ICD-10-CM | POA: Diagnosis not present

## 2020-12-17 DIAGNOSIS — L930 Discoid lupus erythematosus: Secondary | ICD-10-CM | POA: Diagnosis not present

## 2020-12-17 DIAGNOSIS — D485 Neoplasm of uncertain behavior of skin: Secondary | ICD-10-CM | POA: Diagnosis not present

## 2020-12-17 DIAGNOSIS — D225 Melanocytic nevi of trunk: Secondary | ICD-10-CM | POA: Diagnosis not present

## 2021-01-09 ENCOUNTER — Encounter: Payer: Self-pay | Admitting: Family Medicine

## 2021-01-09 ENCOUNTER — Ambulatory Visit: Payer: BC Managed Care – PPO | Admitting: Family Medicine

## 2021-01-09 ENCOUNTER — Other Ambulatory Visit: Payer: Self-pay

## 2021-01-09 ENCOUNTER — Telehealth: Payer: Self-pay | Admitting: Family Medicine

## 2021-01-09 ENCOUNTER — Ambulatory Visit (INDEPENDENT_AMBULATORY_CARE_PROVIDER_SITE_OTHER): Payer: BC Managed Care – PPO

## 2021-01-09 VITALS — BP 141/73 | HR 81 | Ht 62.0 in | Wt 239.0 lb

## 2021-01-09 DIAGNOSIS — R609 Edema, unspecified: Secondary | ICD-10-CM | POA: Diagnosis not present

## 2021-01-09 DIAGNOSIS — Z91018 Allergy to other foods: Secondary | ICD-10-CM | POA: Diagnosis not present

## 2021-01-09 DIAGNOSIS — Z79899 Other long term (current) drug therapy: Secondary | ICD-10-CM | POA: Diagnosis not present

## 2021-01-09 DIAGNOSIS — I10 Essential (primary) hypertension: Secondary | ICD-10-CM

## 2021-01-09 DIAGNOSIS — M25562 Pain in left knee: Secondary | ICD-10-CM | POA: Diagnosis not present

## 2021-01-09 DIAGNOSIS — R8290 Unspecified abnormal findings in urine: Secondary | ICD-10-CM | POA: Diagnosis not present

## 2021-01-09 DIAGNOSIS — R319 Hematuria, unspecified: Secondary | ICD-10-CM

## 2021-01-09 DIAGNOSIS — R161 Splenomegaly, not elsewhere classified: Secondary | ICD-10-CM | POA: Diagnosis not present

## 2021-01-09 DIAGNOSIS — M7989 Other specified soft tissue disorders: Secondary | ICD-10-CM | POA: Diagnosis not present

## 2021-01-09 DIAGNOSIS — M79605 Pain in left leg: Secondary | ICD-10-CM | POA: Diagnosis not present

## 2021-01-09 DIAGNOSIS — Z888 Allergy status to other drugs, medicaments and biological substances status: Secondary | ICD-10-CM | POA: Diagnosis not present

## 2021-01-09 DIAGNOSIS — R0602 Shortness of breath: Secondary | ICD-10-CM | POA: Diagnosis not present

## 2021-01-09 DIAGNOSIS — R918 Other nonspecific abnormal finding of lung field: Secondary | ICD-10-CM | POA: Diagnosis not present

## 2021-01-09 DIAGNOSIS — K760 Fatty (change of) liver, not elsewhere classified: Secondary | ICD-10-CM | POA: Diagnosis not present

## 2021-01-09 DIAGNOSIS — R0789 Other chest pain: Secondary | ICD-10-CM | POA: Diagnosis not present

## 2021-01-09 DIAGNOSIS — M79662 Pain in left lower leg: Secondary | ICD-10-CM | POA: Diagnosis not present

## 2021-01-09 DIAGNOSIS — R791 Abnormal coagulation profile: Secondary | ICD-10-CM | POA: Diagnosis not present

## 2021-01-09 DIAGNOSIS — R7989 Other specified abnormal findings of blood chemistry: Secondary | ICD-10-CM | POA: Diagnosis not present

## 2021-01-09 DIAGNOSIS — I517 Cardiomegaly: Secondary | ICD-10-CM | POA: Diagnosis not present

## 2021-01-09 LAB — COMPLETE METABOLIC PANEL WITH GFR
AG Ratio: 1.4 (calc) (ref 1.0–2.5)
ALT: 24 U/L (ref 6–29)
AST: 19 U/L (ref 10–35)
Albumin: 4.2 g/dL (ref 3.6–5.1)
Alkaline phosphatase (APISO): 55 U/L (ref 37–153)
BUN/Creatinine Ratio: 12 (calc) (ref 6–22)
BUN: 13 mg/dL (ref 7–25)
CO2: 32 mmol/L (ref 20–32)
Calcium: 10.1 mg/dL (ref 8.6–10.4)
Chloride: 103 mmol/L (ref 98–110)
Creat: 1.11 mg/dL — ABNORMAL HIGH (ref 0.50–1.03)
Globulin: 2.9 g/dL (calc) (ref 1.9–3.7)
Glucose, Bld: 100 mg/dL — ABNORMAL HIGH (ref 65–99)
Potassium: 3.7 mmol/L (ref 3.5–5.3)
Sodium: 142 mmol/L (ref 135–146)
Total Bilirubin: 0.5 mg/dL (ref 0.2–1.2)
Total Protein: 7.1 g/dL (ref 6.1–8.1)
eGFR: 57 mL/min/{1.73_m2} — ABNORMAL LOW (ref >=60)

## 2021-01-09 LAB — POCT URINALYSIS DIP (CLINITEK)
Bilirubin, UA: NEGATIVE
Glucose, UA: NEGATIVE mg/dL
Ketones, POC UA: NEGATIVE mg/dL
Leukocytes, UA: NEGATIVE
Nitrite, UA: POSITIVE — AB
POC PROTEIN,UA: NEGATIVE
Spec Grav, UA: >=1.03 — AB (ref 1.010–1.025)
Urobilinogen, UA: 0.2 E.U./dL
pH, UA: 5.5 (ref 5.0–8.0)

## 2021-01-09 LAB — D-DIMER, QUANTITATIVE: D-Dimer, Quant: 1.56 mcg/mL FEU — ABNORMAL HIGH (ref <0.50)

## 2021-01-09 LAB — CBC WITH DIFFERENTIAL/PLATELET
Absolute Monocytes: 703 cells/uL (ref 200–950)
Basophils Absolute: 40 cells/uL (ref 0–200)
Basophils Relative: 0.4 %
Eosinophils Absolute: 178 cells/uL (ref 15–500)
Eosinophils Relative: 1.8 %
HCT: 40.4 % (ref 35.0–45.0)
Hemoglobin: 13.5 g/dL (ref 11.7–15.5)
Lymphs Abs: 1277 cells/uL (ref 850–3900)
MCH: 29.9 pg (ref 27.0–33.0)
MCHC: 33.4 g/dL (ref 32.0–36.0)
MCV: 89.6 fL (ref 80.0–100.0)
MPV: 9.4 fL (ref 7.5–12.5)
Monocytes Relative: 7.1 %
Neutro Abs: 7702 cells/uL (ref 1500–7800)
Neutrophils Relative %: 77.8 %
Platelets: 354 10*3/uL (ref 140–400)
RBC: 4.51 10*6/uL (ref 3.80–5.10)
RDW: 13 % (ref 11.0–15.0)
Total Lymphocyte: 12.9 %
WBC: 9.9 10*3/uL (ref 3.8–10.8)

## 2021-01-09 LAB — TSH: TSH: 1.6 mIU/L (ref 0.40–4.50)

## 2021-01-09 MED ORDER — FUROSEMIDE 20 MG PO TABS: 20.0000 mg | ORAL_TABLET | Freq: Every day | ORAL | 0 refills | Status: DC | PRN

## 2021-01-09 MED ORDER — CEPHALEXIN 500 MG PO CAPS: 500.0000 mg | ORAL_CAPSULE | Freq: Two times a day (BID) | ORAL | 0 refills | Status: DC

## 2021-01-09 NOTE — Progress Notes (Addendum)
[facility]  Acute Office Visit  Subjective:    Patient ID: Kristen Spence, female    DOB: 17-Apr-1960, 60 y.o.   MRN: [id number]  Chief Complaint  Patient presents with   Leg Swelling   Hypertension    HPI Patient is in today for   Pt stated that the pain and swelling began 1 wk ago in her L leg mainly around her knee and down into her lower leg.  She said just a little trace swelling on the right but the left has been more significant.  She is also just felt very tired.  She just feels like something is off.. She also has felt fatigued and had some SOB.  Last night she felt a little bit of pressure in her chest and then felt her heart race.  And then it stopped it was pretty brief.  No dysuria or hematuria but has noticed a change in urine odor.  She has had some intermittent tingling in her left hand and arm.  He denies any recent travel or prolonged sitting.  Very concerned about the possibility of a blood clot because her sister died from 73.  Past Medical History:  Diagnosis Date   Benign positional vertigo    Dizziness    Edema    Fatigue    GERD (gastroesophageal reflux disease)    hiatal hernia   Heart palpitations    Hiatal hernia    Hypertension    Insomnia    Mental disorder    Migraine     Past Surgical History:  Procedure Laterality Date   BREAST BIOPSY     BREAST EXCISIONAL BIOPSY Left 2000   CESAREAN SECTION  1982   CHOLECYSTECTOMY  1987   ENDOMETRIAL ABLATION  09/30/2006   ESOPHAGOGASTRODUODENOSCOPY     Showed grade D. reflux esophagitis   TOTAL VAGINAL HYSTERECTOMY  09/27/2008   Dr. Huntley Estelle    Family History  Problem Relation Age of Onset   Alcohol abuse Father    Stroke Father    Hypertension Father    Ovarian cancer Mother    Depression Mother    Diabetes Mother    Hyperlipidemia Mother    Hypertension Mother    COPD Mother    Heart attack Sister 75   Breast cancer Sister     Social History   Socioeconomic History   Marital  status: Married    Spouse name: bobby    Number of children: 2   Years of education: 10th grade   Highest education level: Not on file  Occupational History   Occupation:      Employer: [employer]  Tobacco Use   Smoking status: Never   Smokeless tobacco: Never  Substance and Sexual Activity   Alcohol use: No   Drug use: No   Sexual activity: Never    Birth control/protection: Surgical  Other Topics Concern   Not on file  Social History Narrative   No regular exercise   Social Determinants of Health   Financial Resource Strain: Not on file  Food Insecurity: Not on file  Transportation Needs: Not on file  Physical Activity: Not on file  Stress: Not on file  Social Connections: Not on file  Intimate Partner Violence: Not on file    Outpatient Medications Prior to Visit  Medication Sig Dispense Refill   ALPRAZolam (XANAX) 0.25 MG tablet TAKE 1 TABLET (0.25 MG TOTAL) BY MOUTH DAILY AS NEEDED FOR ANXIETY. 90 DAY SUPPLY 90 tablet 0  buPROPion (WELLBUTRIN XL) 150 MG 24 hr tablet Take 1 tablet (150 mg total) by mouth daily. 90 tablet 3   cholecalciferol (VITAMIN D) 1000 units tablet Take 2,000 Units by mouth daily.     diclofenac Sodium (VOLTAREN) 1 % GEL APPLY 4 G TOPICALLY 4 TIMES DAILY 300 g 11   DULoxetine (CYMBALTA) 60 MG capsule Take 1 capsule (60 mg total) by mouth daily. 90 capsule 1   gabapentin (NEURONTIN) 300 MG capsule Take 2 capsules (600 mg total) by mouth 3 (three) times daily. 540 capsule 3   ipratropium (ATROVENT) 0.03 % nasal spray Place 2 sprays into both nostrils every 12 (twelve) hours. 30 mL 2   lisinopril-hydrochlorothiazide (ZESTORETIC) 20-25 MG tablet Take 1 tablet by mouth daily. 90 tablet 1   meloxicam (MOBIC) 15 MG tablet TAKE 1 TABLET (15 MG TOTAL) BY MOUTH DAILY. PRN 90 tablet 1   montelukast (SINGULAIR) 10 MG tablet TAKE 1 TABLET BY MOUTH EVERYDAY AT BEDTIME 90 tablet 3   nystatin (MYCOSTATIN/NYSTOP) powder Apply to affected area 3 times daily  60 g 2   nystatin ointment (MYCOSTATIN) Apply 1 application topically 2 (two) times daily. 30 g 2   omeprazole (PRILOSEC) 20 MG capsule Take 1-2 capsules (20-40 mg total) by mouth daily. TAKE 1-2 CAPSULES (20-40 MG TOTAL) BY MOUTH DAILY. 180 capsule 3   rizatriptan (MAXALT) 10 MG tablet Take 1 tablet (10 mg total) by mouth as needed. May repeat in 2 hours if needed 36 tablet 4   tacrolimus (PROTOPIC) 0.1 % ointment Apply 1 application topically 2 (two) times daily.     valACYclovir (VALTREX) 1000 MG tablet Take 1 tablet (1,000 mg total) by mouth 2 (two) times daily. When needed for outbreatk 30 tablet 1   No facility-administered medications prior to visit.    Allergies  Allergen Reactions   Banana Hives   Clonazepam Other (See Comments)    Makes her feel weird.    Zolpidem Other (See Comments)    Nightmares    Review of Systems     Objective:    Physical Exam Vitals and nursing note reviewed.  Constitutional:      Appearance: She is well-developed.  HENT:     Head: Normocephalic and atraumatic.  Cardiovascular:     Rate and Rhythm: Normal rate and regular rhythm.     Heart sounds: Normal heart sounds.  Pulmonary:     Effort: Pulmonary effort is normal.     Breath sounds: Normal breath sounds.  Musculoskeletal:     Comments: Trace edema around her right ankle. 1+ pitting around left ankle to mid -tibia.  Knee tender medially above the joint line.   Skin:    General: Skin is warm and dry.  Neurological:     Mental Status: She is alert and oriented to person, place, and time.  Psychiatric:        Behavior: Behavior normal.    BP (!) 141/73    Pulse 81    Ht 5' 2" (1.575 m)    Wt 239 lb (108.4 kg)    SpO2 97%    BMI 43.71 kg/m  Wt Readings from Last 3 Encounters:  01/09/21 239 lb (108.4 kg)  10/28/20 239 lb (108.4 kg)  06/04/20 238 lb (108 kg)    Health Maintenance Due  Topic Date Due   COLONOSCOPY (Pts 45-68yr Insurance coverage will need to be confirmed)   02/20/2015   COVID-19 Vaccine (3 - Booster for [place]) 06/29/2019  There are no preventive care reminders to display for this patient.   Lab Results  Component Value Date   TSH 1.60 01/09/2021   Lab Results  Component Value Date   WBC 9.9 01/09/2021   HGB 13.5 01/09/2021   HCT 40.4 01/09/2021   MCV 89.6 01/09/2021   PLT 354 01/09/2021   Lab Results  Component Value Date   NA 142 01/09/2021   K 3.7 01/09/2021   CO2 32 01/09/2021   GLUCOSE 100 (H) 01/09/2021   BUN 13 01/09/2021   CREATININE 1.11 (H) 01/09/2021   BILITOT 0.5 01/09/2021   ALKPHOS 74 11/01/2015   AST 19 01/09/2021   ALT 24 01/09/2021   PROT 7.1 01/09/2021   ALBUMIN 4.1 11/01/2015   CALCIUM 10.1 01/09/2021   EGFR 57 (L) 01/09/2021   Lab Results  Component Value Date   CHOL 163 10/28/2020   Lab Results  Component Value Date   HDL 48 (L) 10/28/2020   Lab Results  Component Value Date   LDLCALC 97 10/28/2020   Lab Results  Component Value Date   TRIG 86 10/28/2020   Lab Results  Component Value Date   CHOLHDL 3.4 10/28/2020   Lab Results  Component Value Date   HGBA1C 5.2 10/09/2019       Assessment & Plan:   Problem List Items Addressed This Visit       Cardiovascular and Mediastinum   HYPERTENSION, BENIGN - Primary   Relevant Medications   furosemide (LASIX) 20 MG tablet   Other Relevant Orders   TSH (Completed)   CBC with Differential/Platelet (Completed)   COMPLETE METABOLIC PANEL WITH GFR (Completed)   Other Visit Diagnoses     Left leg swelling       Relevant Orders   TSH (Completed)   CBC with Differential/Platelet (Completed)   COMPLETE METABOLIC PANEL WITH GFR (Completed)   D-Dimer, Quantitative (Completed)   Atypical chest pain       Relevant Orders   TSH (Completed)   CBC with Differential/Platelet (Completed)   COMPLETE METABOLIC PANEL WITH GFR (Completed)   D-Dimer, Quantitative (Completed)   EKG 12-Lead   SOB (shortness of breath)       Relevant  Orders   TSH (Completed)   CBC with Differential/Platelet (Completed)   COMPLETE METABOLIC PANEL WITH GFR (Completed)   D-Dimer, Quantitative (Completed)   DG Chest 2 View   Abnormal urine odor       Relevant Orders   Urine Culture   POCT URINALYSIS DIP (CLINITEK) (Completed)   Hematuria, unspecified type       Relevant Orders   Urine Culture      Left lower leg swelling-she does have 1+ pitting edema in her mid tibia down to her ankle and trace edema in her right ankle and lower leg.  We will check renal function, thyroid disorder, liver function.  We will check a stat D-dimer she is overall fairly low risk but I certainly think with there being a noticeable difference between the left and right leg that has been persistent for a week it is worth evaluating and she is also had some atypical chest pain and shortness of breath with this as well.  Atypical chest pain/shortness of breath-we will get EKG today as well as labs to evaluate for thyroid disorder, anemia and stat D-dimer to rule out pulmonary embolism.  We will also get a chest x-ray.  EKG today shows rate of 81 bpm, normal sinus rhythm with no acute  ST-T wave changes.  Meets criteria for LVH.  Unchanged from previous EKG back in 2017.   SOB - Oxygen normal, lungs clear on exam.  Will get CXR.  Consider PE.    Odor to urine - no dysuria but with fatigue and swelling and pos UA will tx with keflex.  UA sent for culture.   Meds ordered this encounter  Medications   furosemide (LASIX) 20 MG tablet    Sig: Take 1 tablet (20 mg total) by mouth daily as needed.    Dispense:  15 tablet    Refill:  0   cephALEXin (KEFLEX) 500 MG capsule    Sig: Take 1 capsule (500 mg total) by mouth 2 (two) times daily.    Dispense:  10 capsule    Refill:  0     Beatrice Lecher, MD

## 2021-01-09 NOTE — Telephone Encounter (Signed)
Notified by on-call nurse that patient had a critical d-dimer result. Attempted to call patient several times with no answer and a full mailbox. PCP aware. We will try again to reach out to her.  Lollie Marrow Reola Calkins, DNP, FNP-C

## 2021-01-09 NOTE — Progress Notes (Signed)
Pt stated that the pain and swelling began 1 wk ago in her L leg mainly around her knee. She also has felt fatigued and had some SOB

## 2021-01-10 ENCOUNTER — Encounter: Payer: Self-pay | Admitting: Family Medicine

## 2021-01-10 ENCOUNTER — Telehealth: Payer: Self-pay | Admitting: Family Medicine

## 2021-01-10 DIAGNOSIS — R0602 Shortness of breath: Secondary | ICD-10-CM | POA: Diagnosis not present

## 2021-01-10 DIAGNOSIS — R9431 Abnormal electrocardiogram [ECG] [EKG]: Secondary | ICD-10-CM

## 2021-01-10 DIAGNOSIS — M79662 Pain in left lower leg: Secondary | ICD-10-CM | POA: Diagnosis not present

## 2021-01-10 DIAGNOSIS — K760 Fatty (change of) liver, not elsewhere classified: Secondary | ICD-10-CM | POA: Insufficient documentation

## 2021-01-10 NOTE — Telephone Encounter (Signed)
Since saw LVH on EKG I think she need an echo of her heart.  Ordered pended. See if preference for location

## 2021-01-10 NOTE — Progress Notes (Signed)
Chest xray is normal. Please pick up the antibiotic for UTI

## 2021-01-10 NOTE — Progress Notes (Unsigned)
Chest xray is nromal.

## 2021-01-10 NOTE — Telephone Encounter (Signed)
Patient returned phone call around 9pm last night and was instructed to go to ED for further workup of elevated d-dimer and symptoms at appointment earlier in the day.

## 2021-01-11 LAB — URINE CULTURE
MICRO NUMBER:: 12793475
SPECIMEN QUALITY:: ADEQUATE

## 2021-01-14 MED ORDER — SULFAMETHOXAZOLE-TRIMETHOPRIM 800-160 MG PO TABS: 1.0000 | ORAL_TABLET | Freq: Two times a day (BID) | ORAL | 0 refills | Status: DC

## 2021-01-14 NOTE — Addendum Note (Signed)
Addended by: Nani Gasser D on: 01/14/2021 07:52 AM   Modules accepted: Orders

## 2021-01-14 NOTE — Telephone Encounter (Signed)
Attempted to contact pt.  VM box is full.  Unable to leave a message.  Tiajuana Amass, CMA

## 2021-01-14 NOTE — Progress Notes (Signed)
Kristen Spence, the urine culture did grow out a bacteria called E. coli which is the most common urinary tract bug.  The antibiotic that I sent in for you is not going to completely work.  So I am sending over a new antibiotic for you to start.  Hopefully this will knock it out completely you can let me know if you are still having symptoms after you complete the new antibiotic.  Prescription sent for Bactrim.

## 2021-01-15 ENCOUNTER — Other Ambulatory Visit: Payer: Self-pay | Admitting: *Deleted

## 2021-01-15 DIAGNOSIS — G43109 Migraine with aura, not intractable, without status migrainosus: Secondary | ICD-10-CM

## 2021-01-15 MED ORDER — RIZATRIPTAN BENZOATE 10 MG PO TABS: 10.0000 mg | ORAL_TABLET | ORAL | 4 refills | Status: AC | PRN

## 2021-01-15 NOTE — Telephone Encounter (Signed)
Orders Placed This Encounter  Procedures   ECHOCARDIOGRAM COMPLETE    Standing Status:   Future    Standing Expiration Date:   01/10/2022    Scheduling Instructions:     prefers    Order Specific Question:   Where should this test be performed    Answer:   [facility] [place]    Order Specific Question:   Perflutren DEFINITY (image enhancing agent) should be administered unless hypersensitivity or allergy exist    Answer:   Administer Perflutren    Order Specific Question:   Is a special reader required? (athlete or structural heart)    Answer:   No    Order Specific Question:   Does this study need to be read by the Structural team/Level 3 readers?    Answer:   No    Order Specific Question:   Reason for exam-Echo    Answer:   Abnormal ECG  R94.31

## 2021-01-17 ENCOUNTER — Other Ambulatory Visit: Payer: Self-pay | Admitting: Family Medicine

## 2021-01-17 ENCOUNTER — Other Ambulatory Visit: Payer: Self-pay

## 2021-01-17 DIAGNOSIS — E559 Vitamin D deficiency, unspecified: Secondary | ICD-10-CM | POA: Diagnosis not present

## 2021-01-17 DIAGNOSIS — Z131 Encounter for screening for diabetes mellitus: Secondary | ICD-10-CM | POA: Diagnosis not present

## 2021-01-17 DIAGNOSIS — R9431 Abnormal electrocardiogram [ECG] [EKG]: Secondary | ICD-10-CM

## 2021-01-17 DIAGNOSIS — N1831 Chronic kidney disease, stage 3a: Secondary | ICD-10-CM | POA: Diagnosis not present

## 2021-01-17 DIAGNOSIS — Z6841 Body Mass Index (BMI) 40.0 and over, adult: Secondary | ICD-10-CM | POA: Diagnosis not present

## 2021-01-17 DIAGNOSIS — R0602 Shortness of breath: Secondary | ICD-10-CM | POA: Diagnosis not present

## 2021-01-17 DIAGNOSIS — E7800 Pure hypercholesterolemia, unspecified: Secondary | ICD-10-CM | POA: Diagnosis not present

## 2021-01-17 NOTE — Progress Notes (Signed)
Patient called stating original order for Echocardiogram was to [facility] in [place]. Patient stated she would like to have echocradiogram done in [place]. Order replaced for location in [place].

## 2021-01-21 ENCOUNTER — Encounter: Payer: Self-pay | Admitting: Family Medicine

## 2021-01-21 ENCOUNTER — Ambulatory Visit: Payer: BC Managed Care – PPO | Admitting: Family Medicine

## 2021-01-21 ENCOUNTER — Other Ambulatory Visit: Payer: Self-pay

## 2021-01-21 ENCOUNTER — Other Ambulatory Visit: Payer: Self-pay | Admitting: Family Medicine

## 2021-01-21 VITALS — BP 109/53 | HR 73 | Temp 98.1°F | Resp 18 | Ht 62.0 in | Wt 234.0 lb

## 2021-01-21 DIAGNOSIS — R319 Hematuria, unspecified: Secondary | ICD-10-CM

## 2021-01-21 DIAGNOSIS — F4322 Adjustment disorder with anxiety: Secondary | ICD-10-CM

## 2021-01-21 DIAGNOSIS — B372 Candidiasis of skin and nail: Secondary | ICD-10-CM

## 2021-01-21 DIAGNOSIS — F4321 Adjustment disorder with depressed mood: Secondary | ICD-10-CM

## 2021-01-21 DIAGNOSIS — R8290 Unspecified abnormal findings in urine: Secondary | ICD-10-CM

## 2021-01-21 DIAGNOSIS — I10 Essential (primary) hypertension: Secondary | ICD-10-CM | POA: Diagnosis not present

## 2021-01-21 DIAGNOSIS — R7309 Other abnormal glucose: Secondary | ICD-10-CM | POA: Diagnosis not present

## 2021-01-21 MED ORDER — BLOOD GLUCOSE METER KIT: PACK | 0 refills | Status: DC

## 2021-01-21 NOTE — Assessment & Plan Note (Signed)
Refilled her xanax today.

## 2021-01-21 NOTE — Assessment & Plan Note (Addendum)
Make sure eating regularly and getting some protein in her diet like nuts, eggs, greek yogurt, etc. prescription written for glucometer.

## 2021-01-21 NOTE — Progress Notes (Signed)
Established Patient Office Visit  Subjective:  Patient ID: Kristen Spence, female    DOB: 07-Oct-1960  Age: 61 y.o. MRN: [id number]  CC:  Chief Complaint  Patient presents with   Discuss Blood Pressure     Patient states BP has been low around 90/54 on 01/19/21 patient states she skipped BP medication on 01/20/21. Patient states BP today has been around 125/82. Patient states she was having some dizziness this morning.     HPI Kristen Spence presents for Patient states BP has been low around 90/54 on 01/19/21 patient states she skipped BP medication on 01/20/21. Patient states BP today has been around 125/82. She did take her BP med today.  Patient states she was having some dizziness this morning. Feels a little like her head is floating.  Vision gets blurry.  Has had some intermittent left chest pressure.  LVH sent on EKG. Echo not scheduled until later this month.    When she came in 2 weeks ago for swelling and SOB and her d-dimer came back high, she went to the ED and check ed out OK she decided to really make some major lifestyle changes.  She cut out soda and salt.  Cut out caffeine. She wonders if her sugar may be low. She hasn't felt hungry so she hasn't been eating regularly.    Also dx with UTI. She completed her ABS and is feeling better. She would like to make sure the UTI has cleared.   Past Medical History:  Diagnosis Date   Benign positional vertigo    Dizziness    Edema    Fatigue    GERD (gastroesophageal reflux disease)    hiatal hernia   Heart palpitations    Hiatal hernia    Hypertension    Insomnia    Mental disorder    Migraine       Outpatient Medications Prior to Visit  Medication Sig Dispense Refill   ALPRAZolam (XANAX) 0.25 MG tablet TAKE 1 TABLET (0.25 MG TOTAL) BY MOUTH DAILY AS NEEDED FOR ANXIETY. 90 DAY SUPPLY 90 tablet 0   buPROPion (WELLBUTRIN XL) 150 MG 24 hr tablet Take 1 tablet (150 mg total) by mouth daily. 90 tablet 3    cholecalciferol (VITAMIN D) 1000 units tablet Take 2,000 Units by mouth daily.     diclofenac Sodium (VOLTAREN) 1 % GEL APPLY 4 G TOPICALLY 4 TIMES DAILY 300 g 11   DULoxetine (CYMBALTA) 60 MG capsule Take 1 capsule (60 mg total) by mouth daily. 90 capsule 1   furosemide (LASIX) 20 MG tablet Take 1 tablet (20 mg total) by mouth daily as needed. 15 tablet 0   gabapentin (NEURONTIN) 300 MG capsule Take 2 capsules (600 mg total) by mouth 3 (three) times daily. 540 capsule 3   hydroxychloroquine (PLAQUENIL) 200 MG tablet Take 200 mg by mouth 2 (two) times daily.     ipratropium (ATROVENT) 0.03 % nasal spray Place 2 sprays into both nostrils every 12 (twelve) hours. 30 mL 2   lisinopril-hydrochlorothiazide (ZESTORETIC) 20-25 MG tablet Take 1 tablet by mouth daily. 90 tablet 1   meloxicam (MOBIC) 15 MG tablet TAKE 1 TABLET (15 MG TOTAL) BY MOUTH DAILY. PRN 90 tablet 1   montelukast (SINGULAIR) 10 MG tablet TAKE 1 TABLET BY MOUTH EVERYDAY AT BEDTIME 90 tablet 3   nystatin (MYCOSTATIN/NYSTOP) powder Apply to affected area 3 times daily 60 g 2   nystatin ointment (MYCOSTATIN) Apply 1 application topically 2 (two) times  daily. 30 g 2   omeprazole (PRILOSEC) 20 MG capsule Take 1-2 capsules (20-40 mg total) by mouth daily. TAKE 1-2 CAPSULES (20-40 MG TOTAL) BY MOUTH DAILY. 180 capsule 3   rizatriptan (MAXALT) 10 MG tablet Take 1 tablet (10 mg total) by mouth as needed. May repeat in 2 hours if needed 36 tablet 4   tacrolimus (PROTOPIC) 0.1 % ointment Apply 1 application topically 2 (two) times daily.     topiramate (TOPAMAX) 25 MG tablet Take 25 mg by mouth 2 (two) times daily.     valACYclovir (VALTREX) 1000 MG tablet Take 1 tablet (1,000 mg total) by mouth 2 (two) times daily. When needed for outbreatk 30 tablet 1   cephALEXin (KEFLEX) 500 MG capsule Take 1 capsule (500 mg total) by mouth 2 (two) times daily. 10 capsule 0   sulfamethoxazole-trimethoprim (BACTRIM DS) 800-160 MG tablet Take 1 tablet by mouth 2  (two) times daily. 6 tablet 0   No facility-administered medications prior to visit.    Allergies  Allergen Reactions   Banana Hives   Clonazepam Other (See Comments)    Makes her feel weird.    Zolpidem Other (See Comments)    Nightmares    ROS Review of Systems    Objective:    Physical Exam Constitutional:      Appearance: Normal appearance. She is well-developed.  HENT:     Head: Normocephalic and atraumatic.  Cardiovascular:     Rate and Rhythm: Normal rate and regular rhythm.     Heart sounds: Normal heart sounds.  Pulmonary:     Effort: Pulmonary effort is normal.     Breath sounds: Normal breath sounds.  Skin:    General: Skin is warm and dry.  Neurological:     Mental Status: She is alert and oriented to person, place, and time.  Psychiatric:        Behavior: Behavior normal.    BP (!) 109/53    Pulse 73    Temp 98.1 F (36.7 C)    Resp 18    Ht '5\' 2"'  (1.575 m)    Wt 234 lb (106.1 kg)    SpO2 96%    BMI 42.80 kg/m  Wt Readings from Last 3 Encounters:  01/21/21 234 lb (106.1 kg)  01/09/21 239 lb (108.4 kg)  10/28/20 239 lb (108.4 kg)     There are no preventive care reminders to display for this patient.   There are no preventive care reminders to display for this patient.  Lab Results  Component Value Date   TSH 1.60 01/09/2021   Lab Results  Component Value Date   WBC 9.9 01/09/2021   HGB 13.5 01/09/2021   HCT 40.4 01/09/2021   MCV 89.6 01/09/2021   PLT 354 01/09/2021   Lab Results  Component Value Date   NA 142 01/09/2021   K 3.7 01/09/2021   CO2 32 01/09/2021   GLUCOSE 100 (H) 01/09/2021   BUN 13 01/09/2021   CREATININE 1.11 (H) 01/09/2021   BILITOT 0.5 01/09/2021   ALKPHOS 74 11/01/2015   AST 19 01/09/2021   ALT 24 01/09/2021   PROT 7.1 01/09/2021   ALBUMIN 4.1 11/01/2015   CALCIUM 10.1 01/09/2021   EGFR 57 (L) 01/09/2021   Lab Results  Component Value Date   CHOL 163 10/28/2020   Lab Results  Component Value Date    HDL 48 (L) 10/28/2020   Lab Results  Component Value Date   LDLCALC 97 10/28/2020  Lab Results  Component Value Date   TRIG 86 10/28/2020   Lab Results  Component Value Date   CHOLHDL 3.4 10/28/2020   Lab Results  Component Value Date   HGBA1C 5.2 10/09/2019      Assessment & Plan:   Problem List Items Addressed This Visit       Cardiovascular and Mediastinum   HYPERTENSION, BENIGN - Primary    BP has been low since made some major diet changes and has already dropped 5 lbs.  Hasn't had to take her diuretic in about 3 days.  Cut BP pill in half and monitor for the next 2 weeks.          Other   Morbid obesity ([place])    She feels committed to major lifestyle changes. She has already lost 5 lbs and her swelling is much improved.  Her goal is to be able to improve her activity level.  Encouraged light walking.        Adjustment disorder with anxiety    Refilled her xanax today.       Abnormal glucose    Make sure eating regularly and getting some protein in her diet like nuts, eggs, greek yogurt, etc. prescription written for glucometer.      Other Visit Diagnoses     Hematuria, unspecified type       Relevant Medications   blood glucose meter kit and supplies   Other Relevant Orders   Urine Culture       Meds ordered this encounter  Medications   blood glucose meter kit and supplies    Sig: Dispense based on patient and insurance preference. Check blood sugar once as directed. R73.03    Dispense:  1 each    Refill:  0    Order Specific Question:   Number of strips    Answer:   100    Order Specific Question:   Number of lancets    Answer:   100    Follow-up: Return in about 23 days (around 02/13/2021) for Go over echo results and recheck BP.    Beatrice Lecher, MD

## 2021-01-21 NOTE — Patient Instructions (Signed)
Please cut your BP pill in half.

## 2021-01-21 NOTE — Assessment & Plan Note (Signed)
She feels committed to major lifestyle changes. She has already lost 5 lbs and her swelling is much improved.  Her goal is to be able to improve her activity level.  Encouraged light walking.

## 2021-01-21 NOTE — Assessment & Plan Note (Signed)
BP has been low since made some major diet changes and has already dropped 5 lbs.  Hasn't had to take her diuretic in about 3 days.  Cut BP pill in half and monitor for the next 2 weeks.

## 2021-01-22 ENCOUNTER — Telehealth: Payer: Self-pay | Admitting: *Deleted

## 2021-01-22 ENCOUNTER — Encounter: Payer: Self-pay | Admitting: Family Medicine

## 2021-01-22 DIAGNOSIS — I517 Cardiomegaly: Secondary | ICD-10-CM | POA: Diagnosis not present

## 2021-01-22 LAB — UNLABELED: Test Ordered On Req: 395

## 2021-01-22 NOTE — Telephone Encounter (Signed)
Form printed for unlabeled specimen with requested information confirming correct patient and signed.   Faxed confirmation received.

## 2021-01-22 NOTE — Telephone Encounter (Signed)
-----   Message from Agapito Games, MD sent at 01/22/2021  7:34 AM EST ----- Says specimen is unlabeled.

## 2021-01-23 LAB — PAT ID TIQ DOC: Test Affected: 395

## 2021-01-23 LAB — URINE CULTURE
MICRO NUMBER:: 12826242
SPECIMEN QUALITY:: ADEQUATE

## 2021-01-23 NOTE — Progress Notes (Signed)
Hi Kristen Spence, your urine culture actually looked okay it just shows mixed flora which just is coming from the skin but nothing specific from the bladder.  I hope you are feeling better.

## 2021-01-24 NOTE — Addendum Note (Signed)
Addended by: Osborne Oman on: 01/24/2021 04:05 PM   Modules accepted: Orders

## 2021-01-24 NOTE — Addendum Note (Signed)
Addended by: Osborne Oman on: 01/24/2021 09:15 AM   Modules accepted: Orders

## 2021-02-11 ENCOUNTER — Ambulatory Visit (HOSPITAL_BASED_OUTPATIENT_CLINIC_OR_DEPARTMENT_OTHER): Payer: BC Managed Care – PPO

## 2021-02-18 ENCOUNTER — Encounter: Payer: Self-pay | Admitting: Family Medicine

## 2021-02-18 ENCOUNTER — Ambulatory Visit: Payer: BC Managed Care – PPO | Admitting: Family Medicine

## 2021-02-18 ENCOUNTER — Other Ambulatory Visit: Payer: Self-pay

## 2021-02-18 VITALS — BP 123/58 | HR 85 | Resp 18 | Ht 62.0 in | Wt 237.0 lb

## 2021-02-18 DIAGNOSIS — I10 Essential (primary) hypertension: Secondary | ICD-10-CM

## 2021-02-18 DIAGNOSIS — Z7689 Persons encountering health services in other specified circumstances: Secondary | ICD-10-CM | POA: Diagnosis not present

## 2021-02-18 DIAGNOSIS — I517 Cardiomegaly: Secondary | ICD-10-CM

## 2021-02-18 MED ORDER — LISINOPRIL 20 MG PO TABS: 20.0000 mg | ORAL_TABLET | Freq: Every day | ORAL | 0 refills | Status: DC

## 2021-02-18 NOTE — Assessment & Plan Note (Signed)
Blood pressure looks absolutely fantastic today.  Discussed goals for monitoring.  He says at home she does tend to see some occasional 130s but most the time it does look pretty good so she would like to adjust the medication.  I think that is a great idea for at least the short-term and as she continues to work on her healthy diet and weight loss then we can probably scale back.  We will add another 20 mg of lisinopril to her lisinopril HCTZ 20/25.

## 2021-02-18 NOTE — Progress Notes (Signed)
Established Patient Office Visit  Subjective:  Patient ID: Kristen Spence, female    DOB: 08-07-1960  Age: 61 y.o. MRN: [id number]  CC:  Chief Complaint  Patient presents with   Follow up     Echo results     HPI Kristen Spence presents for follow-up of echocardiogram fo r LVH seen on EKG. we sent her for echocardiogram for further evaluation.  It did confirm moderate septal left ventricular hypertrophy no other worrisome findings.  We did have to call and get them to fax it over today she had specifically requested that they fax it over last Friday but we have not received anything.  He says she is really made some major changes over the last month she is cut out soda completely and she is really cut out sweets as well she was hoping that she would have lost a little bit more weight and still is struggling with that but does feel like she is feeling better in general she is less tired.  She feels less short of breath.  She states her goal is to really get back down to about 140 pounds.  She is 5 2 and knows that that is where she would feel most healthy.  He has noticed that her heart rate will sometimes jump up to about 110 just at rest.  Most of the time though it is between 70 and 85.  She has been checking her blood pressures at home as well.  She does still take her diuretic occasionally and says took her last one yesterday so she would like a refill.  She has just been very very stressed over the last few weeks.  Past Medical History:  Diagnosis Date   Benign positional vertigo    Dizziness    Edema    Fatigue    GERD (gastroesophageal reflux disease)    hiatal hernia   Heart palpitations    Hiatal hernia    Hypertension    Insomnia    Mental disorder    Migraine     Past Surgical History:  Procedure Laterality Date   BREAST BIOPSY     BREAST EXCISIONAL BIOPSY Left 2000   CESAREAN SECTION  1982   CHOLECYSTECTOMY  1987   ENDOMETRIAL ABLATION  09/30/2006    ESOPHAGOGASTRODUODENOSCOPY     Showed grade D. reflux esophagitis   TOTAL VAGINAL HYSTERECTOMY  09/27/2008   Dr. Huntley Estelle    Family History  Problem Relation Age of Onset   Alcohol abuse Father    Stroke Father    Hypertension Father    Ovarian cancer Mother    Depression Mother    Diabetes Mother    Hyperlipidemia Mother    Hypertension Mother    COPD Mother    Heart attack Sister 67   Breast cancer Sister     Social History   Socioeconomic History   Marital status: Married    Spouse name: bobby    Number of children: 2   Years of education: 10th grade   Highest education level: Not on file  Occupational History   Occupation:      Employer: [employer]  Tobacco Use   Smoking status: Never   Smokeless tobacco: Never  Substance and Sexual Activity   Alcohol use: No   Drug use: No   Sexual activity: Never    Birth control/protection: Surgical  Other Topics Concern   Not on file  Social History Narrative  No regular exercise   Social Determinants of Health   Financial Resource Strain: Not on file  Food Insecurity: Not on file  Transportation Needs: Not on file  Physical Activity: Not on file  Stress: Not on file  Social Connections: Not on file  Intimate Partner Violence: Not on file    Outpatient Medications Prior to Visit  Medication Sig Dispense Refill   ALPRAZolam (XANAX) 0.25 MG tablet TAKE 1 TABLET (0.25 MG TOTAL) BY MOUTH DAILY AS NEEDED FOR ANXIETY. 90 DAY SUPPLY 90 tablet 0   blood glucose meter kit and supplies Dispense based on patient and insurance preference. Check blood sugar once as directed. R73.03 1 each 0   buPROPion (WELLBUTRIN XL) 150 MG 24 hr tablet Take 1 tablet (150 mg total) by mouth daily. 90 tablet 3   cholecalciferol (VITAMIN D) 1000 units tablet Take 2,000 Units by mouth daily.     diclofenac Sodium (VOLTAREN) 1 % GEL APPLY 4 G TOPICALLY 4 TIMES DAILY 300 g 11   DULoxetine (CYMBALTA) 60 MG capsule Take 1 capsule (60  mg total) by mouth daily. 90 capsule 1   furosemide (LASIX) 20 MG tablet Take 1 tablet (20 mg total) by mouth daily as needed. 15 tablet 0   gabapentin (NEURONTIN) 300 MG capsule Take 2 capsules (600 mg total) by mouth 3 (three) times daily. 540 capsule 3   hydroxychloroquine (PLAQUENIL) 200 MG tablet Take 200 mg by mouth 2 (two) times daily.     ipratropium (ATROVENT) 0.03 % nasal spray Place 2 sprays into both nostrils every 12 (twelve) hours. 30 mL 2   lisinopril-hydrochlorothiazide (ZESTORETIC) 20-25 MG tablet Take 1 tablet by mouth daily. 90 tablet 1   meloxicam (MOBIC) 15 MG tablet TAKE 1 TABLET (15 MG TOTAL) BY MOUTH DAILY. PRN 90 tablet 1   montelukast (SINGULAIR) 10 MG tablet TAKE 1 TABLET BY MOUTH EVERYDAY AT BEDTIME 90 tablet 3   nystatin (MYCOSTATIN/NYSTOP) powder Apply to affected area 3 times daily 60 g 2   nystatin ointment (MYCOSTATIN) Apply 1 application topically 2 (two) times daily. 30 g 2   omeprazole (PRILOSEC) 20 MG capsule Take 1-2 capsules (20-40 mg total) by mouth daily. TAKE 1-2 CAPSULES (20-40 MG TOTAL) BY MOUTH DAILY. 180 capsule 3   rizatriptan (MAXALT) 10 MG tablet Take 1 tablet (10 mg total) by mouth as needed. May repeat in 2 hours if needed 36 tablet 4   tacrolimus (PROTOPIC) 0.1 % ointment Apply 1 application topically 2 (two) times daily.     topiramate (TOPAMAX) 25 MG tablet Take 25 mg by mouth 2 (two) times daily.     valACYclovir (VALTREX) 1000 MG tablet Take 1 tablet (1,000 mg total) by mouth 2 (two) times daily. When needed for outbreatk 30 tablet 1   No facility-administered medications prior to visit.    Allergies  Allergen Reactions   Banana Hives   Clonazepam Other (See Comments)    Makes her feel weird.    Zolpidem Other (See Comments)    Nightmares    ROS Review of Systems    Objective:    Physical Exam Constitutional:      Appearance: Normal appearance. She is well-developed.  HENT:     Head: Normocephalic and atraumatic.   Cardiovascular:     Rate and Rhythm: Normal rate and regular rhythm.     Heart sounds: Normal heart sounds.  Pulmonary:     Effort: Pulmonary effort is normal.     Breath sounds: Normal breath  sounds.  Skin:    General: Skin is warm and dry.  Neurological:     Mental Status: She is alert and oriented to person, place, and time.  Psychiatric:        Behavior: Behavior normal.    BP (!) 123/58    Pulse 85    Resp 18    Ht _0  (1.575 m)    Wt 237 lb (107.5 kg)    BMI 43.35 kg/m  Wt Readings from Last 3 Encounters:  02/18/21 237 lb (107.5 kg)  01/21/21 234 lb (106.1 kg)  01/09/21 239 lb (108.4 kg)     Health Maintenance Due  Topic Date Due   COVID-19 Vaccine (3 - Booster for Pfizer series) 06/29/2019    There are no preventive care reminders to display for this patient.  Lab Results  Component Value Date   TSH 1.60 01/09/2021   Lab Results  Component Value Date   WBC 9.9 01/09/2021   HGB 13.5 01/09/2021   HCT 40.4 01/09/2021   MCV 89.6 01/09/2021   PLT 354 01/09/2021   Lab Results  Component Value Date   NA 142 01/09/2021   K 3.7 01/09/2021   CO2 32 01/09/2021   GLUCOSE 100 (H) 01/09/2021   BUN 13 01/09/2021   CREATININE 1.11 (H) 01/09/2021   BILITOT 0.5 01/09/2021   ALKPHOS 74 11/01/2015   AST 19 01/09/2021   ALT 24 01/09/2021   PROT 7.1 01/09/2021   ALBUMIN 4.1 11/01/2015   CALCIUM 10.1 01/09/2021   EGFR 57 (L) 01/09/2021   Lab Results  Component Value Date   CHOL 163 10/28/2020   Lab Results  Component Value Date   HDL 48 (L) 10/28/2020   Lab Results  Component Value Date   LDLCALC 97 10/28/2020   Lab Results  Component Value Date   TRIG 86 10/28/2020   Lab Results  Component Value Date   CHOLHDL 3.4 10/28/2020   Lab Results  Component Value Date   HGBA1C 5.2 10/09/2019      Assessment & Plan:   Problem List Items Addressed This Visit       Cardiovascular and Mediastinum   LVH (left ventricular hypertrophy)    Reviewed  echocardiogram results done at [facility] in January with her.  We will scan a copy of those results into the chart.  We discussed the importance of really maintaining good blood pressure with a systolic under 147 and the diastolic less than 80.  Making sure eating healthy and getting her BMI into a normal range and exercising regularly.  Would like to consider repeating the echo in about 2 years.      Relevant Medications   lisinopril (ZESTRIL) 20 MG tablet   HYPERTENSION, BENIGN    Blood pressure looks absolutely fantastic today.  Discussed goals for monitoring.  He says at home she does tend to see some occasional 130s but most the time it does look pretty good so she would like to adjust the medication.  I think that is a great idea for at least the short-term and as she continues to work on her healthy diet and weight loss then we can probably scale back.  We will add another 20 mg of lisinopril to her lisinopril HCTZ 20/25.      Relevant Medications   lisinopril (ZESTRIL) 20 MG tablet     Other   Encounter for weight management - Primary    Visit #: Starting Weight: 237 lbs  Current weight:237 lbs Previous weight: Change in weight: Goal weight: 140 lbs  Dietary goals: Avoid soda and sweets.  Also discussed cutting back on carbohydrate intake. 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  Also discussed try to do some walking over lunch. Medication: Okay to start Topamax. Follow-up and referrals: 6 weeks        Meds ordered this encounter  Medications   lisinopril (ZESTRIL) 20 MG tablet    Sig: Take 1 tablet (20 mg total) by mouth daily.    Dispense:  90 tablet    Refill:  0    Follow-up: Return in about 3 months (around 05/18/2021) for Hypertension.    Beatrice Lecher, MD

## 2021-02-18 NOTE — Assessment & Plan Note (Signed)
Visit #: Starting Weight: 237 lbs   Current weight:237 lbs Previous weight: Change in weight: Goal weight: 140 lbs  Dietary goals: Avoid soda and sweets.  Also discussed cutting back on carbohydrate intake. 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  Also discussed try to do some walking over lunch. Medication: Okay to start Topamax. Follow-up and referrals: 6 weeks

## 2021-02-18 NOTE — Assessment & Plan Note (Signed)
Reviewed echocardiogram results done at [facility] in January with her.  We will scan a copy of those results into the chart.  We discussed the importance of really maintaining good blood pressure with a systolic under 130 and the diastolic less than 80.  Making sure eating healthy and getting her BMI into a normal range and exercising regularly.  Would like to consider repeating the echo in about 2 years.

## 2021-02-19 ENCOUNTER — Other Ambulatory Visit: Payer: Self-pay

## 2021-02-19 ENCOUNTER — Other Ambulatory Visit: Payer: Self-pay | Admitting: Family Medicine

## 2021-02-19 MED ORDER — FUROSEMIDE 20 MG PO TABS: 20.0000 mg | ORAL_TABLET | Freq: Every day | ORAL | 5 refills | Status: DC | PRN

## 2021-02-26 ENCOUNTER — Other Ambulatory Visit: Payer: Self-pay | Admitting: Family Medicine

## 2021-03-03 ENCOUNTER — Ambulatory Visit (INDEPENDENT_AMBULATORY_CARE_PROVIDER_SITE_OTHER): Payer: BC Managed Care – PPO | Admitting: Family Medicine

## 2021-03-03 ENCOUNTER — Other Ambulatory Visit: Payer: Self-pay | Admitting: Family Medicine

## 2021-03-03 ENCOUNTER — Encounter: Payer: Self-pay | Admitting: Family Medicine

## 2021-03-03 ENCOUNTER — Other Ambulatory Visit: Payer: Self-pay

## 2021-03-03 VITALS — BP 119/72 | HR 69 | Resp 16 | Ht 62.0 in | Wt 237.0 lb

## 2021-03-03 DIAGNOSIS — Z1211 Encounter for screening for malignant neoplasm of colon: Secondary | ICD-10-CM

## 2021-03-03 DIAGNOSIS — B372 Candidiasis of skin and nail: Secondary | ICD-10-CM

## 2021-03-03 DIAGNOSIS — I10 Essential (primary) hypertension: Secondary | ICD-10-CM

## 2021-03-03 DIAGNOSIS — Z0000 Encounter for general adult medical examination without abnormal findings: Secondary | ICD-10-CM

## 2021-03-03 DIAGNOSIS — R21 Rash and other nonspecific skin eruption: Secondary | ICD-10-CM

## 2021-03-03 MED ORDER — FLUCONAZOLE 150 MG PO TABS: 150.0000 mg | ORAL_TABLET | Freq: Once | ORAL | 0 refills | Status: DC

## 2021-03-03 MED ORDER — PREDNISONE 20 MG PO TABS: 40.0000 mg | ORAL_TABLET | Freq: Every day | ORAL | 0 refills | Status: DC

## 2021-03-03 NOTE — Progress Notes (Signed)
Subjective:     Kristen Spence is a 61 y.o. female and is here for a comprehensive physical exam. The patient reports no problems.  Social History   Socioeconomic History   Marital status: Married    Spouse name: bobby    Number of children: 2   Years of education: 10th grade   Highest education level: Not on file  Occupational History   Occupation:      Employer: [employer]  Tobacco Use   Smoking status: Never   Smokeless tobacco: Never  Substance and Sexual Activity   Alcohol use: No   Drug use: No   Sexual activity: Never    Birth control/protection: Surgical  Other Topics Concern   Not on file  Social History Narrative   Exercise daily 10-30 min. No caffeine.    Social Determinants of Health   Financial Resource Strain: Not on file  Food Insecurity: Not on file  Transportation Needs: Not on file  Physical Activity: Not on file  Stress: Not on file  Social Connections: Not on file  Intimate Partner Violence: Not on file   Health Maintenance  Topic Date Due   COLONOSCOPY (Pts 45-68yrs Insurance coverage will need to be confirmed)  02/20/2015   COVID-19 Vaccine (3 - Booster for Pfizer series) 03/19/2021 (Originally 06/29/2019)   Hepatitis C Screening  04/12/2028 (Originally 12/16/1979)   HIV Screening  04/12/2028 (Originally 12/15/1976)   MAMMOGRAM  11/08/2022   TETANUS/TDAP  01/20/2028   INFLUENZA VACCINE  Completed   Zoster Vaccines- Shingrix  Completed   Pneumococcal Vaccine 21-64 Years old  Aged Out   HPV VACCINES  Aged Out    The following portions of the patient's history were reviewed and updated as appropriate: allergies, current medications, past family history, past medical history, past social history, past surgical history, and problem list.  Review of Systems Pertinent items are noted in HPI.   Objective:    BP 119/72    Pulse 69    Resp 16    Ht 5\' 2"  (1.575 m)    Wt 237 lb (107.5 kg)    SpO2 98%    BMI 43.35 kg/m  General appearance:  alert, cooperative, and appears stated age Head: Normocephalic, without obvious abnormality, atraumatic Eyes:  conj clear, EOMI, PEERLA Ears: normal TM's and external ear canals both ears Nose: Nares normal. Septum midline. Mucosa normal. No drainage or sinus tenderness. Throat: lips, mucosa, and tongue normal; teeth and gums normal Neck: no adenopathy, no carotid bruit, no JVD, supple, symmetrical, trachea midline, and thyroid not enlarged, symmetric, no tenderness/mass/nodules Back: symmetric, no curvature. ROM normal. No CVA tenderness. Lungs: clear to auscultation bilaterally Breasts: normal appearance, no masses or tenderness, inverted nipple on the right.  Heart: regular rate and rhythm, S1, S2 normal, no murmur, click, rub or gallop Abdomen: soft, non-tender; bowel sounds normal; no masses,  no organomegaly Extremities: extremities normal, atraumatic, no cyanosis or edema Pulses: 2+ and symmetric Skin: Skin color, texture, turgor normal. No rashes or lesions Lymph nodes: Cervical, supraclavicular, and axillary nodes normal. Neurologic: Grossly normal    Assessment:    Healthy female exam.      Plan:     See After Visit Summary for Counseling Recommendations  Keep up a regular exercise program and make sure you are eating a healthy diet Try to eat 4 servings of dairy a day, or if you are lactose intolerant take a calcium with vitamin D daily.  Your vaccines are up  to date.  Discussed colon can screening.   Will get updated labs.  Orders Placed This Encounter  Procedures   COMPLETE METABOLIC PANEL WITH GFR   Lipid panel   CBC   Ambulatory referral to Gastroenterology    Referral Priority:   Routine    Referral Type:   Consultation    Referral Reason:   Specialty Services Required    Number of Visits Requested:   1   Rash-she still has a very thick course and rash over her upper chest, forehead ears and upper outer arms and lower arms.  Will treat with oral prednisone  since this worked well in the past and she just got a new moisturizer.

## 2021-03-04 LAB — COMPLETE METABOLIC PANEL WITH GFR
AG Ratio: 1.4 (calc) (ref 1.0–2.5)
ALT: 28 U/L (ref 6–29)
AST: 20 U/L (ref 10–35)
Albumin: 4.1 g/dL (ref 3.6–5.1)
Alkaline phosphatase (APISO): 54 U/L (ref 37–153)
BUN: 15 mg/dL (ref 7–25)
CO2: 28 mmol/L (ref 20–32)
Calcium: 9.9 mg/dL (ref 8.6–10.4)
Chloride: 105 mmol/L (ref 98–110)
Creat: 0.9 mg/dL (ref 0.50–1.03)
Globulin: 2.9 g/dL (calc) (ref 1.9–3.7)
Glucose, Bld: 90 mg/dL (ref 65–99)
Potassium: 4.2 mmol/L (ref 3.5–5.3)
Sodium: 143 mmol/L (ref 135–146)
Total Bilirubin: 0.5 mg/dL (ref 0.2–1.2)
Total Protein: 7 g/dL (ref 6.1–8.1)
eGFR: 74 mL/min/{1.73_m2} (ref >=60)

## 2021-03-04 LAB — LIPID PANEL
Cholesterol: 169 mg/dL (ref <200)
HDL: 41 mg/dL — ABNORMAL LOW (ref >=50)
LDL Cholesterol (Calc): 108 mg/dL (calc) — ABNORMAL HIGH
Non-HDL Cholesterol (Calc): 128 mg/dL (calc) (ref <130)
Total CHOL/HDL Ratio: 4.1 (calc) (ref <5.0)
Triglycerides: 108 mg/dL (ref <150)

## 2021-03-04 LAB — CBC
HCT: 40.5 % (ref 35.0–45.0)
Hemoglobin: 13.4 g/dL (ref 11.7–15.5)
MCH: 29.6 pg (ref 27.0–33.0)
MCHC: 33.1 g/dL (ref 32.0–36.0)
MCV: 89.6 fL (ref 80.0–100.0)
MPV: 9.7 fL (ref 7.5–12.5)
Platelets: 342 10*3/uL (ref 140–400)
RBC: 4.52 10*6/uL (ref 3.80–5.10)
RDW: 13.4 % (ref 11.0–15.0)
WBC: 6.5 10*3/uL (ref 3.8–10.8)

## 2021-03-04 NOTE — Progress Notes (Signed)
Hi Kristen Spence, your LDL cholesterol is up compared to the last year.  Just up a little bit so just continue to work on healthy diet and regular exercise.  Your metabolic panel looks great.  Blood count is normal.  We will work on the GI referral to get your screening colonoscopy set up.

## 2021-03-21 ENCOUNTER — Ambulatory Visit (INDEPENDENT_AMBULATORY_CARE_PROVIDER_SITE_OTHER): Payer: BC Managed Care – PPO

## 2021-03-21 ENCOUNTER — Ambulatory Visit: Payer: BC Managed Care – PPO | Admitting: Physician Assistant

## 2021-03-21 ENCOUNTER — Other Ambulatory Visit: Payer: Self-pay

## 2021-03-21 ENCOUNTER — Encounter: Payer: Self-pay | Admitting: Physician Assistant

## 2021-03-21 VITALS — BP 117/62 | HR 87 | Ht 62.0 in | Wt 237.0 lb

## 2021-03-21 DIAGNOSIS — M79672 Pain in left foot: Secondary | ICD-10-CM

## 2021-03-21 DIAGNOSIS — M25572 Pain in left ankle and joints of left foot: Secondary | ICD-10-CM | POA: Diagnosis not present

## 2021-03-21 DIAGNOSIS — M7989 Other specified soft tissue disorders: Secondary | ICD-10-CM | POA: Diagnosis not present

## 2021-03-21 NOTE — Patient Instructions (Signed)
Peroneal Tendinopathy Peroneal tendinopathy is irritation of the tendons that pass behind your ankle (peroneal tendons). These tendons attach muscles in your foot to a bone on the side of your foot and underneath the arch of your foot. This condition can cause your peroneal tendons to get bigger and swell. What are the causes? This condition may be caused by: Putting stress on your ankle over and over again (overuse injury). A sudden injury that puts stress on your tendons, such as an ankle sprain. What increases the risk? You are more likely to develop this condition if you: Have high arches. Play sports that involve putting stress on the ankle over and over again. These sports include: Running. Dancing. Soccer. Basketball. What are the signs or symptoms? Symptoms of this condition can start suddenly or develop gradually. Symptoms of this condition include: Pain in the back of the ankle, on the side of the foot, or in the arch of the foot. Pain that gets worse with activity and better with rest. Swelling. Warmth. Weakness in your foot or ankle. How is this diagnosed? This condition may be diagnosed based on: Your symptoms. Your medical history. A physical exam. During the exam, your health care provider may move your foot and ankle and test the strength of your leg muscles. Imaging tests, such as: X-rays or a CT scan to check for bone injury. MRI or ultrasound to check for muscle or tendon injury. How is this treated? This condition may be treated by: Keeping your body weight off your ankle for several days. Returning to full activity gradually. Putting ice on your ankle to reduce swelling. Taking NSAIDs, such as ibuprofen. Having medicine injected into your tendon to reduce swelling. Wearing a removable boot or brace for ankle support. Doing range-of-motion exercises and strengthening exercises (physical therapy) when pain and swelling improve. If the condition does not  improve with treatment, or if a tendon or muscle is damaged, surgery may be needed. Follow these instructions at home: If you have a boot or brace: Wear the boot or brace as told by your health care provider. Remove it only as told by your health care provider. Loosen the boot or brace if your toes tingle, become numb, or turn cold and blue. Keep the boot or brace clean. If the boot or brace is not waterproof: Do not let it get wet. Cover it with a watertight covering when you take a bath or shower. Managing pain, stiffness, and swelling  If directed, put ice on the injured area. If you have a removable boot or brace, remove it as told by your health care provider. Put ice in a plastic bag. Place a towel between your skin and the bag. Leave the ice on for 20 minutes, 2-3 times a day. Move your toes often to reduce stiffness and swelling. Raise (elevate) your ankle above the level of your heart while you are sitting or lying down. Activity Do not do activities that make pain or swelling worse. Do exercises as told by your health care provider. Return to your normal activities as told by your health care provider. Ask your health care provider what activities are safe for you. Ask your health care provider when it is safe to drive if you have a boot or brace on your foot. General instructions Take over-the-counter and prescription medicines only as told by your health care provider. Do not use any products that contain nicotine or tobacco, such as cigarettes, e-cigarettes, and chewing tobacco. These can  delay healing. If you need help quitting, ask your health care provider. Keep all follow-up visits as told by your health care provider. This is important. How is this prevented? Wear supportive footwear that is appropriate for your athletic activity. Avoid athletic activities that cause swelling or pain in your ankle or foot. See your health care provider if you have pain or swelling that  does not improve after a few days of rest. Stop training if you develop pain or swelling. If you start a new athletic activity, start gradually to build up your strength, endurance, and flexibility. Warm up and stretch before being active. Cool down and stretch after being active. Contact a health care provider if: Your symptoms get worse. Your symptoms do not improve in 2-4 weeks. You develop new, unexplained symptoms. Summary Peroneal tendinopathy is irritation of the tendons that pass behind your ankle. This condition is caused by overuse or sudden injury to the peroneal tendon. Symptoms include pain, swelling, warmth, and weakness in your foot or ankle. This condition is treated with rest, ice, medicines, physical therapy, and surgery if needed. This information is not intended to replace advice given to you by your health care provider. Make sure you discuss any questions you have with your health care provider. Document Revised: 04/28/2018 Document Reviewed: 02/14/2018 Elsevier Patient Education  2022 [employer]   Peroneal Tendinopathy Rehab Ask your health care provider which exercises are safe for you. Do exercises exactly as told by your health care provider and adjust them as directed. It is normal to feel mild stretching, pulling, tightness, or discomfort as you do these exercises. Stop right away if you feel sudden pain or your pain gets worse. Do not begin these exercises until told by your health care provider. Stretching and range-of-motion exercises These exercises warm up your muscles and joints and improve the movement and flexibility of your ankle. These exercises also help to relieve pain and stiffness. Gastroc and soleus stretch, standing This is an exercise in which you stand on a step and use your body weight to stretch your calf muscles. To do this exercise: Stand on the edge of a step on the ball of your left / right foot. The ball of your foot is on the walking  surface, right under your toes. Keep your other foot firmly on the same step. Hold on to the wall, a railing, or a chair for balance. Slowly lift your other foot, allowing your body weight to press your left / right heel down over the edge of the step. You should feel a stretch in your left / right calf (gastrocnemius and soleus). Hold this position for __________ seconds. Return both feet to the step. Repeat this exercise with a slight bend in your left / right knee. Repeat __________ times with your left / right knee straight and __________ times with your left / right knee bent. Complete this exercise __________ times a day. Strengthening exercises These exercises build strength and endurance in your foot and ankle. Endurance is the ability to use your muscles for a long time, even after they get tired. Ankle dorsiflexion with band  Secure a rubber exercise band or tube to an object, such as a table leg, that will not move when the band is pulled. Secure the other end of the band around your left / right foot. Sit on the floor, facing the object with your left / right leg extended. The band or tube should be slightly tense when your  foot is relaxed. Slowly flex your left / right ankle and toes to bring your foot toward you (dorsiflexion). Hold this position for __________ seconds. Let the band or tube slowly pull your foot back to the starting position. Repeat __________ times. Complete this exercise __________ times a day. Ankle eversion Sit on the floor with your legs straight out in front of you. Loop a rubber exercise band or tube around the ball of your left / right foot. The ball of your foot is on the walking surface, right under your toes. Hold the ends of the band in your hands, or secure the band to a stable object. The band or tube should be slightly tense when your foot is relaxed. Slowly push your foot outward, away from your other leg (eversion). Hold this position for  __________ seconds. Slowly return your foot to the starting position. Repeat __________ times. Complete this exercise __________ times a day. Plantar flexion, standing This exercise is sometimes called standing heel raise. Stand with your feet shoulder-width apart. Place your hands on a wall or table to steady yourself as needed, but try not to use it for support. Keep your weight spread evenly over the width of your feet while you slowly rise up on your toes (plantar flexion). If told by your health care provider: Shift your weight toward your left / right leg until you feel challenged. Stand on your left / right leg only. Hold this position for __________ seconds. Repeat __________ times. Complete this exercise __________ times a day. Single leg stand Without shoes, stand near a railing or in a doorway. You may hold on to the railing or door frame as needed. Stand on your left / right foot. Keep your big toe down on the floor and try to keep your arch lifted. Do not roll to the outside of your foot. If this exercise is too easy, you can try it with your eyes closed or while standing on a pillow. Hold this position for __________ seconds. Repeat __________ times. Complete this exercise __________ times a day. This information is not intended to replace advice given to you by your health care provider. Make sure you discuss any questions you have with your health care provider. Document Revised: 04/26/2018 Document Reviewed: 04/26/2018 Elsevier Patient Education  2022 [employer].

## 2021-03-21 NOTE — Progress Notes (Signed)
? ?Subjective:  ? ? Patient ID: Kristen Spence, female    DOB: 08-Feb-1960, 61 y.o.   MRN: [id number] ? ?HPI ?Pt is a 61 yo obese female who presents to the clinic with left lateral ankle pain since yesterday after she got home from walking at [employer]. Pain and swelling was sudden. She has not done anything to make better. Bearing weight makes worse. No injury or fall. Nothing like this has happened before.  ? ?.. ?Active Ambulatory Problems  ?  Diagnosis Date Noted  ? ANEMIA, IRON DEFICIENCY 12/09/2006  ? Migraine headache 12/09/2006  ? HYPERTENSION, BENIGN 03/11/2009  ? Diaphragmatic hernia 04/23/2008  ? Other insomnia 03/11/2009  ? PALPITATIONS 12/09/2006  ? BENIGN POSITIONAL VERTIGO 01/15/2010  ? Depression 06/12/2010  ? Morbid obesity (HCC) 01/08/2014  ? Allergic conjunctivitis and rhinitis 03/04/2015  ? Seasonal allergies 03/11/2015  ? Family history of ovarian cancer 11/20/2015  ? Gastroesophageal reflux disease 03/14/2018  ? Adjustment disorder with anxiety 12/09/2012  ? Generalized anxiety disorder 12/13/2012  ? Major depression, single episode 12/09/2012  ? Panic disorder with agoraphobia 12/23/2012  ? Oral herpes 09/12/2018  ? Seasonal allergic rhinitis due to pollen 04/13/2019  ? Left leg pain 10/05/2019  ? Complicated grief 11/16/2019  ? PTSD (post-traumatic stress disorder) 03/01/2020  ? Hepatic steatosis 01/10/2021  ? Abnormal glucose 01/21/2021  ? Encounter for weight management 02/18/2021  ? LVH (left ventricular hypertrophy) 02/18/2021  ? Left foot pain 03/21/2021  ? Left lateral ankle pain 03/21/2021  ? ?Resolved Ambulatory Problems  ?  Diagnosis Date Noted  ? OBESITY 12/09/2006  ? DIZZINESS 12/09/2006  ? FATIGUE 04/13/2007  ? SINUSITIS - ACUTE-NOS 03/18/2010  ? BRONCHITIS, ACUTE 11/18/2010  ? Dysuria 11/18/2010  ? Eustachian tube dysfunction 12/28/2011  ? Skin tag 12/28/2011  ? Essential hypertension 06/04/2015  ? Routine health maintenance 11/20/2015  ? Acute right eye pain 12/13/2017  ? Vision  changes 12/13/2017  ? Abnormal urine odor 12/13/2017  ? Acute cystitis without hematuria 12/13/2017  ? ?Past Medical History:  ?Diagnosis Date  ? Benign positional vertigo   ? Dizziness   ? Edema   ? Fatigue   ? GERD (gastroesophageal reflux disease)   ? Heart palpitations   ? Hiatal hernia   ? Hypertension   ? Insomnia   ? Mental disorder   ? Migraine   ? ? ? ?Review of Systems ? ?  See HPI.  ?Objective:  ? Physical Exam ?Vitals reviewed.  ?Constitutional:   ?   Appearance: Normal appearance. She is obese.  ?HENT:  ?   Head: Normocephalic.  ?Musculoskeletal:  ?   Left lower leg: Edema present.  ?   Comments: Left lateral ankle swollen and tender but not warm to touch.  ?NROM of left ankle ?5/5 strength left foot ?Tenderness more over tendon that inserts at the base of 5th metatarsal and up around the lateral malleolus.  ?Achilles tendon non tender ?  ?Neurological:  ?   Mental Status: She is alert.  ? ? ? ? ? ?   ? ?Assessment & Plan:  ?..Takeria was seen today for ankle pain. ? ?Diagnoses and all orders for this visit: ? ?Left lateral ankle pain ?-     DG Foot Complete Left; Future ?-     DG Ankle Complete Left; Future ? ?Left foot pain ?-     DG Foot Complete Left; Future ?-     DG Ankle Complete Left; Future ? ? ?STAT xrays are negative  for fractures ?Not warm less likely gout ?Likely peroneal tendonitis will use voltaren gel, ice, rest, compress. ?Follow up in 2 weeks if not improving or sooner if worsening.  ?Try to not excessively walk on it and wear good supportive shoes.  ?Exercises given for peroneal tendon.  ?

## 2021-03-27 ENCOUNTER — Other Ambulatory Visit: Payer: Self-pay | Admitting: Family Medicine

## 2021-04-03 ENCOUNTER — Telehealth: Payer: Self-pay | Admitting: Neurology

## 2021-04-03 NOTE — Telephone Encounter (Signed)
Patient has been scheduled with Dr Madilyn Fireman for a same day. ?

## 2021-04-03 NOTE — Telephone Encounter (Signed)
Patient left vm stating she is having an increase in cough. She is using Tessalon with no relief in symptoms. Cough is interrupting sleep. Please call patient to set up appt for evaluation. Not seen for cough recently.  ?

## 2021-04-04 ENCOUNTER — Other Ambulatory Visit: Payer: Self-pay

## 2021-04-04 ENCOUNTER — Encounter: Payer: Self-pay | Admitting: Family Medicine

## 2021-04-04 ENCOUNTER — Ambulatory Visit: Payer: BC Managed Care – PPO | Admitting: Family Medicine

## 2021-04-04 VITALS — BP 129/68 | HR 79 | Resp 18 | Ht 62.0 in | Wt 240.0 lb

## 2021-04-04 DIAGNOSIS — J019 Acute sinusitis, unspecified: Secondary | ICD-10-CM

## 2021-04-04 MED ORDER — AZITHROMYCIN 250 MG PO TABS: ORAL_TABLET | ORAL | 0 refills | Status: AC

## 2021-04-04 MED ORDER — HYDROCODONE BIT-HOMATROP MBR 5-1.5 MG/5ML PO SOLN: 5.0000 mL | Freq: Three times a day (TID) | ORAL | 0 refills | Status: DC | PRN

## 2021-04-04 NOTE — Progress Notes (Signed)
? ?Acute Office Visit ? ?Subjective:  ? ? Patient ID: Kristen Spence, female    DOB: Feb 20, 1960, 61 y.o.   MRN: [id number] ? ?Chief Complaint  ?Patient presents with  ? Cough  ?  Slight productive cough, headache, head pressure/pain, 1 week    ? ? ?HPI ?Patient is in today for Cough and sinus drainage.  + HA, and Head pressure over her maxillaries.  She says she has been sick for about a week at this point.  In the last 2 days she is got a very sore throat which she did not have initially.  She says she just feels exhausted and miserable she says that with a lot of effort for her to even get her today she is also noticed that her blood pressure and heart rate have gone up in the last few days as well and she started to feel short of breath with laying down and with even just walking across the room.  Says her cough is sometimes productive and sometimes dry. ? ?Past Medical History:  ?Diagnosis Date  ? Benign positional vertigo   ? Dizziness   ? Edema   ? Fatigue   ? GERD (gastroesophageal reflux disease)   ? hiatal hernia  ? Heart palpitations   ? Hiatal hernia   ? Hypertension   ? Insomnia   ? Mental disorder   ? Migraine   ? ? ?Past Surgical History:  ?Procedure Laterality Date  ? BREAST BIOPSY    ? BREAST EXCISIONAL BIOPSY Left 2000  ? [place]  ? CHOLECYSTECTOMY  1987  ? ENDOMETRIAL ABLATION  09/30/2006  ? ESOPHAGOGASTRODUODENOSCOPY    ? Showed grade D. reflux esophagitis  ? TOTAL VAGINAL HYSTERECTOMY  09/27/2008  ? Dr. Huntley Estelle  ? ? ?Family History  ?Problem Relation Age of Onset  ? Alcohol abuse Father   ? Stroke Father   ? Hypertension Father   ? Ovarian cancer Mother   ? Depression Mother   ? Diabetes Mother   ? Hyperlipidemia Mother   ? Hypertension Mother   ? COPD Mother   ? Heart attack Sister 48  ? Breast cancer Sister   ? ? ?Social History  ? ?Socioeconomic History  ? Marital status: Married  ?  Spouse name: bobby   ? Number of children: 2  ? Years of education: 10th grade  ?  Highest education level: Not on file  ?Occupational History  ? Occupation:    ?  Employer: [employer]  ?Tobacco Use  ? Smoking status: Never  ? Smokeless tobacco: Never  ?Substance and Sexual Activity  ? Alcohol use: No  ? Drug use: No  ? Sexual activity: Never  ?  Birth control/protection: Surgical  ?Other Topics Concern  ? Not on file  ?Social History Narrative  ? Exercise daily 10-30 min. No caffeine.   ? ?Social Determinants of Health  ? ?Financial Resource Strain: Not on file  ?Food Insecurity: Not on file  ?Transportation Needs: Not on file  ?Physical Activity: Not on file  ?Stress: Not on file  ?Social Connections: Not on file  ?Intimate Partner Violence: Not on file  ? ? ?Outpatient Medications Prior to Visit  ?Medication Sig Dispense Refill  ? Accu-Chek Softclix Lancets lancets USE TO TEST SUGAR ONCE A DAY 100 each 3  ? ALPRAZolam (XANAX) 0.25 MG tablet TAKE 1 TABLET (0.25 MG TOTAL) BY MOUTH DAILY AS NEEDED FOR ANXIETY. 90 DAY SUPPLY 90 tablet 0  ?  blood glucose meter kit and supplies Dispense based on patient and insurance preference. Check blood sugar once as directed. R73.03 1 each 0  ? buPROPion (WELLBUTRIN XL) 150 MG 24 hr tablet Take 1 tablet (150 mg total) by mouth daily. 90 tablet 3  ? cholecalciferol (VITAMIN D) 1000 units tablet Take 2,000 Units by mouth daily.    ? diclofenac Sodium (VOLTAREN) 1 % GEL APPLY 4 G TOPICALLY 4 TIMES DAILY 300 g 11  ? DULoxetine (CYMBALTA) 60 MG capsule Take 1 capsule (60 mg total) by mouth daily. 90 capsule 1  ? furosemide (LASIX) 20 MG tablet TAKE 1 TABLET BY MOUTH EVERY DAY AS NEEDED 90 tablet 0  ? gabapentin (NEURONTIN) 300 MG capsule Take 2 capsules (600 mg total) by mouth 3 (three) times daily. 540 capsule 3  ? hydroxychloroquine (PLAQUENIL) 200 MG tablet Take 200 mg by mouth 2 (two) times daily.    ? ipratropium (ATROVENT) 0.03 % nasal spray Place 2 sprays into both nostrils every 12 (twelve) hours. 30 mL 2  ? lisinopril (ZESTRIL) 20 MG tablet Take 1  tablet (20 mg total) by mouth daily. 90 tablet 0  ? lisinopril-hydrochlorothiazide (ZESTORETIC) 20-25 MG tablet TAKE 1 TABLET BY MOUTH EVERY DAY 90 tablet 1  ? meloxicam (MOBIC) 15 MG tablet TAKE 1 TABLET (15 MG TOTAL) BY MOUTH DAILY. PRN 90 tablet 1  ? montelukast (SINGULAIR) 10 MG tablet TAKE 1 TABLET BY MOUTH EVERYDAY AT BEDTIME 90 tablet 3  ? nystatin (MYCOSTATIN/NYSTOP) powder Apply to affected area 3 times daily 60 g 2  ? nystatin ointment (MYCOSTATIN) Apply 1 application topically 2 (two) times daily. 30 g 2  ? omeprazole (PRILOSEC) 20 MG capsule Take 1-2 capsules (20-40 mg total) by mouth daily. TAKE 1-2 CAPSULES (20-40 MG TOTAL) BY MOUTH DAILY. 180 capsule 3  ? rizatriptan (MAXALT) 10 MG tablet Take 1 tablet (10 mg total) by mouth as needed. May repeat in 2 hours if needed 36 tablet 4  ? tacrolimus (PROTOPIC) 0.1 % ointment Apply 1 application topically 2 (two) times daily.    ? topiramate (TOPAMAX) 25 MG tablet Take 25 mg by mouth 2 (two) times daily.    ? valACYclovir (VALTREX) 1000 MG tablet Take 1 tablet (1,000 mg total) by mouth 2 (two) times daily. When needed for outbreatk 30 tablet 1  ? ?No facility-administered medications prior to visit.  ? ? ?Allergies  ?Allergen Reactions  ? Banana Hives  ? Clonazepam Other (See Comments)  ?  Makes her feel weird.   ? Zolpidem Other (See Comments)  ?  Nightmares  ? ? ?Review of Systems ? ?   ?Objective:  ?  ?Physical Exam ?Constitutional:   ?   Appearance: She is well-developed.  ?HENT:  ?   Head: Normocephalic and atraumatic.  ?   Right Ear: External ear normal.  ?   Left Ear: External ear normal.  ?   Nose: Nose normal.  ?   Mouth/Throat:  ?   Pharynx: Posterior oropharyngeal erythema present.  ?Eyes:  ?   Conjunctiva/sclera: Conjunctivae normal.  ?   Pupils: Pupils are equal, round, and reactive to light.  ?Neck:  ?   Thyroid: No thyromegaly.  ?Cardiovascular:  ?   Rate and Rhythm: Normal rate and regular rhythm.  ?   Heart sounds: Normal heart sounds.   ?Pulmonary:  ?   Effort: Pulmonary effort is normal.  ?   Breath sounds: Normal breath sounds. No wheezing.  ?Musculoskeletal:  ?  Cervical back: Neck supple.  ?Lymphadenopathy:  ?   Cervical: No cervical adenopathy.  ?Skin: ?   General: Skin is warm and dry.  ?Neurological:  ?   Mental Status: She is alert and oriented to person, place, and time.  ? ? ?BP 129/68   Pulse 79   Resp 18   Ht '5\' 2"'  (1.575 m)   Wt 240 lb (108.9 kg)   SpO2 97%   BMI 43.90 kg/m?  ?Wt Readings from Last 3 Encounters:  ?04/04/21 240 lb (108.9 kg)  ?03/21/21 237 lb (107.5 kg)  ?03/03/21 237 lb (107.5 kg)  ? ? ?Health Maintenance Due  ?Topic Date Due  ? COLONOSCOPY (Pts 45-68yr Insurance coverage will need to be confirmed)  02/20/2015  ? COVID-19 Vaccine (3 - Booster for Pfizer series) 06/29/2019  ? ? ?There are no preventive care reminders to display for this patient. ? ? ?Lab Results  ?Component Value Date  ? TSH 1.60 01/09/2021  ? ?Lab Results  ?Component Value Date  ? WBC 6.5 03/03/2021  ? HGB 13.4 03/03/2021  ? HCT 40.5 03/03/2021  ? MCV 89.6 03/03/2021  ? PLT 342 03/03/2021  ? ?Lab Results  ?Component Value Date  ? NA 143 03/03/2021  ? K 4.2 03/03/2021  ? CO2 28 03/03/2021  ? GLUCOSE 90 03/03/2021  ? BUN 15 03/03/2021  ? CREATININE 0.90 03/03/2021  ? BILITOT 0.5 03/03/2021  ? ALKPHOS 74 11/01/2015  ? AST 20 03/03/2021  ? ALT 28 03/03/2021  ? PROT 7.0 03/03/2021  ? ALBUMIN 4.1 11/01/2015  ? CALCIUM 9.9 03/03/2021  ? EGFR 74 03/03/2021  ? ?Lab Results  ?Component Value Date  ? CHOL 169 03/03/2021  ? ?Lab Results  ?Component Value Date  ? HDL 41 (L) 03/03/2021  ? ?Lab Results  ?Component Value Date  ? LDLCALC 108 (H) 03/03/2021  ? ?Lab Results  ?Component Value Date  ? TRIG 108 03/03/2021  ? ?Lab Results  ?Component Value Date  ? CHOLHDL 4.1 03/03/2021  ? ?Lab Results  ?Component Value Date  ? HGBA1C 5.2 10/09/2019  ? ? ?   ?Assessment & Plan:  ? ?Problem List Items Addressed This Visit   ?None ?Visit Diagnoses   ? ? Acute  non-recurrent sinusitis, unspecified location    -  Primary  ? Relevant Medications  ? azithromycin (ZITHROMAX) 250 MG tablet  ? HYDROcodone bit-homatropine (HYCODAN) 5-1.5 MG/5ML syrup  ? ?  ? ?Acute sinusitis-we will go ahead a

## 2021-04-07 ENCOUNTER — Telehealth: Payer: Self-pay | Admitting: *Deleted

## 2021-04-07 ENCOUNTER — Other Ambulatory Visit: Payer: Self-pay

## 2021-04-07 ENCOUNTER — Ambulatory Visit (INDEPENDENT_AMBULATORY_CARE_PROVIDER_SITE_OTHER): Payer: BC Managed Care – PPO

## 2021-04-07 DIAGNOSIS — R059 Cough, unspecified: Secondary | ICD-10-CM | POA: Diagnosis not present

## 2021-04-07 DIAGNOSIS — R0789 Other chest pain: Secondary | ICD-10-CM | POA: Diagnosis not present

## 2021-04-07 DIAGNOSIS — R0602 Shortness of breath: Secondary | ICD-10-CM | POA: Diagnosis not present

## 2021-04-07 DIAGNOSIS — J019 Acute sinusitis, unspecified: Secondary | ICD-10-CM

## 2021-04-07 NOTE — Telephone Encounter (Signed)
Pt called and stated that her chest is really tight and whatever she does cough up it is really dark green and thick. She has 1 tablet of the ABX left. She did go have her CXR done today and wanted Dr. Linford Arnold to know this.  ?

## 2021-04-08 ENCOUNTER — Encounter: Payer: Self-pay | Admitting: Family Medicine

## 2021-04-08 NOTE — Progress Notes (Signed)
Great news!  Chest x-ray looks good.

## 2021-04-09 MED ORDER — AMOXICILLIN-POT CLAVULANATE 875-125 MG PO TABS
1.0000 | ORAL_TABLET | Freq: Two times a day (BID) | ORAL | 0 refills | Status: DC
Start: 2021-04-09 — End: 2021-05-12

## 2021-04-10 ENCOUNTER — Other Ambulatory Visit: Payer: Self-pay

## 2021-04-10 ENCOUNTER — Encounter: Payer: Self-pay | Admitting: Family Medicine

## 2021-04-10 ENCOUNTER — Ambulatory Visit: Payer: BC Managed Care – PPO | Admitting: Family Medicine

## 2021-04-10 VITALS — BP 107/63 | HR 56 | Resp 18 | Ht 62.0 in | Wt 234.0 lb

## 2021-04-10 DIAGNOSIS — J22 Unspecified acute lower respiratory infection: Secondary | ICD-10-CM | POA: Diagnosis not present

## 2021-04-10 MED ORDER — HYDROCODONE BIT-HOMATROP MBR 5-1.5 MG/5ML PO SOLN: 5.0000 mL | Freq: Three times a day (TID) | ORAL | 0 refills | Status: DC | PRN

## 2021-04-10 MED ORDER — PREDNISONE 20 MG PO TABS: 20.0000 mg | ORAL_TABLET | Freq: Every day | ORAL | 0 refills | Status: DC

## 2021-04-10 MED ORDER — ALBUTEROL SULFATE HFA 108 (90 BASE) MCG/ACT IN AERS: 2.0000 | INHALATION_SPRAY | Freq: Four times a day (QID) | RESPIRATORY_TRACT | 0 refills | Status: DC | PRN

## 2021-04-10 NOTE — Progress Notes (Signed)
? ?Acute Office Visit ? ?Subjective:  ? ? Patient ID: Kristen Spence, female    DOB: Nov 11, 1960, 61 y.o.   MRN: [id number] ? ?Chief Complaint  ?Patient presents with  ? Cough  ?  Productive cough, left ear pain, sore throat, chest congestion and productive cough. Patient has not started on the Augmentin   ? ? ?HPI ?Patient is in today for Productive cough, left ear pain, left sore throat, chest congestion and productive cough.  I saw her for sinus symptoms on March 17 she took the azithromycin and says it really did not help.  Now she is coughing so hard it feels like she is gagging and vomiting.  She is barely sleeping as the cough is keeping her awake at night.  She says the cough syrup did help but she ran out.  She does notice some occasional wheezing.  She had actually sent a MyChart yesterday and we had sent in some Augmentin but she has not picked it up yet as she had made the appointment for today.  ? ?Past Medical History:  ?Diagnosis Date  ? Benign positional vertigo   ? Dizziness   ? Edema   ? Fatigue   ? GERD (gastroesophageal reflux disease)   ? hiatal hernia  ? Heart palpitations   ? Hiatal hernia   ? Hypertension   ? Insomnia   ? Mental disorder   ? Migraine   ? ? ?Past Surgical History:  ?Procedure Laterality Date  ? BREAST BIOPSY    ? BREAST EXCISIONAL BIOPSY Left 2000  ? [place]  ? CHOLECYSTECTOMY  1987  ? ENDOMETRIAL ABLATION  09/30/2006  ? ESOPHAGOGASTRODUODENOSCOPY    ? Showed grade D. reflux esophagitis  ? TOTAL VAGINAL HYSTERECTOMY  09/27/2008  ? Dr. Huntley Estelle  ? ? ?Family History  ?Problem Relation Age of Onset  ? Alcohol abuse Father   ? Stroke Father   ? Hypertension Father   ? Ovarian cancer Mother   ? Depression Mother   ? Diabetes Mother   ? Hyperlipidemia Mother   ? Hypertension Mother   ? COPD Mother   ? Heart attack Sister 41  ? Breast cancer Sister   ? ? ?Social History  ? ?Socioeconomic History  ? Marital status: Married  ?  Spouse name: bobby   ? Number of  children: 2  ? Years of education: 10th grade  ? Highest education level: Not on file  ?Occupational History  ? Occupation:    ?  Employer: [employer]  ?Tobacco Use  ? Smoking status: Never  ? Smokeless tobacco: Never  ?Substance and Sexual Activity  ? Alcohol use: No  ? Drug use: No  ? Sexual activity: Never  ?  Birth control/protection: Surgical  ?Other Topics Concern  ? Not on file  ?Social History Narrative  ? Exercise daily 10-30 min. No caffeine.   ? ?Social Determinants of Health  ? ?Financial Resource Strain: Not on file  ?Food Insecurity: Not on file  ?Transportation Needs: Not on file  ?Physical Activity: Not on file  ?Stress: Not on file  ?Social Connections: Not on file  ?Intimate Partner Violence: Not on file  ? ? ?Outpatient Medications Prior to Visit  ?Medication Sig Dispense Refill  ? Accu-Chek Softclix Lancets lancets USE TO TEST SUGAR ONCE A DAY 100 each 3  ? ALPRAZolam (XANAX) 0.25 MG tablet TAKE 1 TABLET (0.25 MG TOTAL) BY MOUTH DAILY AS NEEDED FOR ANXIETY. [place]  90 tablet 0  ? blood glucose meter kit and supplies Dispense based on patient and insurance preference. Check blood sugar once as directed. R73.03 1 each 0  ? buPROPion (WELLBUTRIN XL) 150 MG 24 hr tablet Take 1 tablet (150 mg total) by mouth daily. 90 tablet 3  ? cholecalciferol (VITAMIN D) 1000 units tablet Take 2,000 Units by mouth daily.    ? diclofenac Sodium (VOLTAREN) 1 % GEL APPLY 4 G TOPICALLY 4 TIMES DAILY 300 g 11  ? DULoxetine (CYMBALTA) 60 MG capsule Take 1 capsule (60 mg total) by mouth daily. 90 capsule 1  ? furosemide (LASIX) 20 MG tablet TAKE 1 TABLET BY MOUTH EVERY DAY AS NEEDED 90 tablet 0  ? gabapentin (NEURONTIN) 300 MG capsule Take 2 capsules (600 mg total) by mouth 3 (three) times daily. 540 capsule 3  ? hydroxychloroquine (PLAQUENIL) 200 MG tablet Take 200 mg by mouth 2 (two) times daily.    ? ipratropium (ATROVENT) 0.03 % nasal spray Place 2 sprays into both nostrils every 12 (twelve) hours. 30 mL 2   ? lisinopril (ZESTRIL) 20 MG tablet Take 1 tablet (20 mg total) by mouth daily. 90 tablet 0  ? lisinopril-hydrochlorothiazide (ZESTORETIC) 20-25 MG tablet TAKE 1 TABLET BY MOUTH EVERY DAY 90 tablet 1  ? meloxicam (MOBIC) 15 MG tablet TAKE 1 TABLET (15 MG TOTAL) BY MOUTH DAILY. PRN 90 tablet 1  ? montelukast (SINGULAIR) 10 MG tablet TAKE 1 TABLET BY MOUTH EVERYDAY AT BEDTIME 90 tablet 3  ? nystatin (MYCOSTATIN/NYSTOP) powder Apply to affected area 3 times daily 60 g 2  ? nystatin ointment (MYCOSTATIN) Apply 1 application topically 2 (two) times daily. 30 g 2  ? omeprazole (PRILOSEC) 20 MG capsule Take 1-2 capsules (20-40 mg total) by mouth daily. TAKE 1-2 CAPSULES (20-40 MG TOTAL) BY MOUTH DAILY. 180 capsule 3  ? rizatriptan (MAXALT) 10 MG tablet Take 1 tablet (10 mg total) by mouth as needed. May repeat in 2 hours if needed 36 tablet 4  ? tacrolimus (PROTOPIC) 0.1 % ointment Apply 1 application topically 2 (two) times daily.    ? topiramate (TOPAMAX) 25 MG tablet Take 25 mg by mouth 2 (two) times daily.    ? valACYclovir (VALTREX) 1000 MG tablet Take 1 tablet (1,000 mg total) by mouth 2 (two) times daily. When needed for outbreatk 30 tablet 1  ? amoxicillin-clavulanate (AUGMENTIN) 875-125 MG tablet Take 1 tablet by mouth 2 (two) times daily. (Patient not taking: Reported on 04/10/2021) 20 tablet 0  ? HYDROcodone bit-homatropine (HYCODAN) 5-1.5 MG/5ML syrup Take 5 mLs by mouth every 8 (eight) hours as needed for cough. 75 mL 0  ? ?No facility-administered medications prior to visit.  ? ? ?Allergies  ?Allergen Reactions  ? Banana Hives  ? Clonazepam Other (See Comments)  ?  Makes her feel weird.   ? Zolpidem Other (See Comments)  ?  Nightmares  ? ? ?Review of Systems ? ?   ?Objective:  ?  ?Physical Exam ?Constitutional:   ?   Appearance: She is well-developed.  ?HENT:  ?   Head: Normocephalic and atraumatic.  ?   Right Ear: External ear normal.  ?   Left Ear: External ear normal.  ?   Nose: Nose normal.  ?Eyes:  ?    Conjunctiva/sclera: Conjunctivae normal.  ?   Pupils: Pupils are equal, round, and reactive to light.  ?Neck:  ?   Thyroid: No thyromegaly.  ?Cardiovascular:  ?   Rate and Rhythm: Normal  rate and regular rhythm.  ?   Heart sounds: Normal heart sounds.  ?Pulmonary:  ?   Effort: Pulmonary effort is normal.  ?   Breath sounds: Normal breath sounds. No wheezing.  ?Musculoskeletal:  ?   Cervical back: Neck supple.  ?Lymphadenopathy:  ?   Cervical: No cervical adenopathy.  ?Skin: ?   General: Skin is warm and dry.  ?Neurological:  ?   Mental Status: She is alert and oriented to person, place, and time.  ? ? ?BP 107/63   Pulse (!) 56   Resp 18   Ht '5\' 2"'  (1.575 m)   Wt 234 lb (106.1 kg)   SpO2 98%   BMI 42.80 kg/m?  ?Wt Readings from Last 3 Encounters:  ?04/10/21 234 lb (106.1 kg)  ?04/04/21 240 lb (108.9 kg)  ?03/21/21 237 lb (107.5 kg)  ? ? ?Health Maintenance Due  ?Topic Date Due  ? COLONOSCOPY (Pts 45-62yr Insurance coverage will need to be confirmed)  02/20/2015  ? COVID-19 Vaccine (3 - Booster for Pfizer series) 06/29/2019  ? ? ?There are no preventive care reminders to display for this patient. ? ? ?Lab Results  ?Component Value Date  ? TSH 1.60 01/09/2021  ? ?Lab Results  ?Component Value Date  ? WBC 6.5 03/03/2021  ? HGB 13.4 03/03/2021  ? HCT 40.5 03/03/2021  ? MCV 89.6 03/03/2021  ? PLT 342 03/03/2021  ? ?Lab Results  ?Component Value Date  ? NA 143 03/03/2021  ? K 4.2 03/03/2021  ? CO2 28 03/03/2021  ? GLUCOSE 90 03/03/2021  ? BUN 15 03/03/2021  ? CREATININE 0.90 03/03/2021  ? BILITOT 0.5 03/03/2021  ? ALKPHOS 74 11/01/2015  ? AST 20 03/03/2021  ? ALT 28 03/03/2021  ? PROT 7.0 03/03/2021  ? ALBUMIN 4.1 11/01/2015  ? CALCIUM 9.9 03/03/2021  ? EGFR 74 03/03/2021  ? ?Lab Results  ?Component Value Date  ? CHOL 169 03/03/2021  ? ?Lab Results  ?Component Value Date  ? HDL 41 (L) 03/03/2021  ? ?Lab Results  ?Component Value Date  ? LDLCALC 108 (H) 03/03/2021  ? ?Lab Results  ?Component Value Date  ? TRIG 108  03/03/2021  ? ?Lab Results  ?Component Value Date  ? CHOLHDL 4.1 03/03/2021  ? ?Lab Results  ?Component Value Date  ? HGBA1C 5.2 10/09/2019  ? ? ?   ?Assessment & Plan:  ? ?Problem List Items Addressed This Visi

## 2021-04-11 ENCOUNTER — Other Ambulatory Visit: Payer: Self-pay | Admitting: Family Medicine

## 2021-04-20 DIAGNOSIS — N3001 Acute cystitis with hematuria: Secondary | ICD-10-CM | POA: Diagnosis not present

## 2021-04-20 DIAGNOSIS — R109 Unspecified abdominal pain: Secondary | ICD-10-CM | POA: Diagnosis not present

## 2021-04-20 DIAGNOSIS — R112 Nausea with vomiting, unspecified: Secondary | ICD-10-CM | POA: Diagnosis not present

## 2021-04-20 DIAGNOSIS — Z888 Allergy status to other drugs, medicaments and biological substances status: Secondary | ICD-10-CM | POA: Diagnosis not present

## 2021-04-20 DIAGNOSIS — Z91018 Allergy to other foods: Secondary | ICD-10-CM | POA: Diagnosis not present

## 2021-04-20 DIAGNOSIS — I10 Essential (primary) hypertension: Secondary | ICD-10-CM | POA: Diagnosis not present

## 2021-04-20 DIAGNOSIS — Z79899 Other long term (current) drug therapy: Secondary | ICD-10-CM | POA: Diagnosis not present

## 2021-04-20 DIAGNOSIS — N132 Hydronephrosis with renal and ureteral calculous obstruction: Secondary | ICD-10-CM | POA: Diagnosis not present

## 2021-04-20 DIAGNOSIS — Z9049 Acquired absence of other specified parts of digestive tract: Secondary | ICD-10-CM | POA: Diagnosis not present

## 2021-04-24 ENCOUNTER — Encounter: Payer: Self-pay | Admitting: Medical-Surgical

## 2021-04-24 ENCOUNTER — Ambulatory Visit (INDEPENDENT_AMBULATORY_CARE_PROVIDER_SITE_OTHER): Payer: BC Managed Care – PPO

## 2021-04-24 ENCOUNTER — Ambulatory Visit: Payer: BC Managed Care – PPO | Admitting: Medical-Surgical

## 2021-04-24 VITALS — BP 116/70 | HR 81 | Resp 20 | Ht 62.0 in | Wt 233.9 lb

## 2021-04-24 DIAGNOSIS — R051 Acute cough: Secondary | ICD-10-CM

## 2021-04-24 DIAGNOSIS — R0789 Other chest pain: Secondary | ICD-10-CM | POA: Diagnosis not present

## 2021-04-24 DIAGNOSIS — R059 Cough, unspecified: Secondary | ICD-10-CM | POA: Diagnosis not present

## 2021-04-24 NOTE — Progress Notes (Signed)
?  HPI with pertinent ROS:  ? ?CC: BP elevated ? ?HPI:  ?Pleasant 61 year old female presenting today for evaluation of her blood pressure. Reports recently finding out she has LVH and being told to keep her BP under 120 systolically. Has been checking her BP at home and it has been running higher than recommend over the past few days. This morning the reading was 144/88. She is taking Lisinopril-HCTZ 20-25mg  daily. She has also taken the prn extra dose of Lisinopril 20mg  daily for the past 2 days. Yesterday, she felt some chest heaviness in her upper chest and had a fluttering sensation in her breath that she felt she had to cough to get to stop. The chest heaviness has gotten better but the fluttering in her breath remains. Of note, she recently had a sinus infection that had to be treated. She also had to seek ER care over the weekend due to kidney stones. She was given several pain medications at the ED and prescribed another antibiotic for a kidney infection before being sent home. Reports this has caused quite a bit of stress but she is also worried because today is the anniversary of her sister's death from a heart attack. Denies fever, chills, shortness of breath, chest pain, jaw pain, upper extremity pain, nausea, or diaphoresis.  ? ?I reviewed the past medical history, family history, social history, surgical history, and allergies today and no changes were needed.  Please see the problem list section below in epic for further details. ? ? ?Physical exam:  ? ?General: Well Developed, well nourished, and in no acute distress.  ?Neuro: Alert and oriented x3.  ?HEENT: Normocephalic, atraumatic.  ?Skin: Warm and dry. ?Cardiac: Regular rate and rhythm, no murmurs rubs or gallops, no lower extremity edema.  ?Respiratory: Clear to auscultation bilaterally. Not using accessory muscles, speaking in full sentences. ? ?Impression and Recommendations:   ? ?1. Chest heaviness ?2. Acute cough ?Unclear etiology. Consider  cardiac etiology but recent workup reassuring. In office EKG showing rate 70, NSR, normal axis. Getting chest x-ray. CBC and CMP reviewed from ED visit over the weekend with no concerning findings. Checking TSH and troponin. Consider GERD but no current concerns with reflux symptoms. Possible relation to anxiety and manifestation of panic given her recent health concerns and today's significance. Patient agrees that anxiety may be the likely culprit. Reviewed recommendation to seek urgent care/ED evaluation for emergency symptoms. Patient verbalized understanding and is agreeable to the plan.  ?- TSH ?- Troponin I ?- DG Chest 2 View; Future ? ?Return for follow up with PCP if symptoms continue next week. ?___________________________________________ ? , DNP, APRN, FNP-BC ?Primary Care and Sports Medicine ?[facility] ?

## 2021-05-12 ENCOUNTER — Other Ambulatory Visit: Payer: Self-pay | Admitting: Family Medicine

## 2021-05-12 ENCOUNTER — Emergency Department (INDEPENDENT_AMBULATORY_CARE_PROVIDER_SITE_OTHER)
Admission: EM | Admit: 2021-05-12 | Discharge: 2021-05-12 | Disposition: A | Discharge status: Home / Self Care | Payer: BC Managed Care – PPO | Source: Home / Self Care | Attending: Family Medicine | Admitting: Family Medicine

## 2021-05-12 DIAGNOSIS — F4321 Adjustment disorder with depressed mood: Secondary | ICD-10-CM

## 2021-05-12 DIAGNOSIS — G510 Bell's palsy: Secondary | ICD-10-CM | POA: Diagnosis not present

## 2021-05-12 MED ORDER — AMOXICILLIN 875 MG PO TABS: ORAL_TABLET | ORAL | 0 refills | Status: DC

## 2021-05-12 MED ORDER — PREDNISONE 20 MG PO TABS: ORAL_TABLET | ORAL | 0 refills | Status: DC

## 2021-05-12 NOTE — Discharge Instructions (Signed)
Cover your right eye with an eye patch at night to keep your lids closed.  Use lubricating eye drops (such as "Refresh" tears) in your right eye several times daily. ? ?If symptoms become significantly worse during the night or over the weekend, proceed to the local emergency room.  ?

## 2021-05-12 NOTE — ED Triage Notes (Signed)
Pt states that she has some right sided facial numbness and drooping. X1 day ? ?Pt states that she tried to get in with her pcp and there were no appointments.  ?

## 2021-05-12 NOTE — ED Provider Notes (Signed)
?[place] ? ? ? ?CSN: [id number] ?Arrival date & time: 05/12/21  1348 ? ? ?  ? ?History   ?Chief Complaint ?Chief Complaint  ?Patient presents with  ? Facial Droop  ?  Right sided facial drooping. X1 day  ? ? ?HPI ?Kristen Spence is a 61 y.o. female.  ? ?About five days ago while drinking iced tea at a restaurant, patient suddenly experienced vague tingling in her right tongue tip, and a vague paresthesia in her right lips.  She thought the sensations were caused by the tea. ?Yesterday she developed mild swelling, weakness, and numbness in her right cheek as well as weakness in her right eyelids.  She has mild difficulty completely closing her right eyelids.  She feels well otherwise without headache, facial rash, changes in vision, extremity weakness, etc.  She admits that she has been under increased stress recently. ?She recently has had mild pain in a right upper tooth and is concerned that a tooth infection could be contributing to her symptoms. ? ?The history is provided by the patient.  ? ?Past Medical History:  ?Diagnosis Date  ? Benign positional vertigo   ? Dizziness   ? Edema   ? Fatigue   ? GERD (gastroesophageal reflux disease)   ? hiatal hernia  ? Heart palpitations   ? Hiatal hernia   ? Hypertension   ? Insomnia   ? Mental disorder   ? Migraine   ? ? ?Patient Active Problem List  ? Diagnosis Date Noted  ? Left foot pain 03/21/2021  ? Left lateral ankle pain 03/21/2021  ? Encounter for weight management 02/18/2021  ? LVH (left ventricular hypertrophy) 02/18/2021  ? Abnormal glucose 01/21/2021  ? Hepatic steatosis 01/10/2021  ? PTSD (post-traumatic stress disorder) 03/01/2020  ? Complicated grief 56/43/3295  ? Left leg pain 10/05/2019  ? Seasonal allergic rhinitis due to pollen 04/13/2019  ? Oral herpes 09/12/2018  ? Gastroesophageal reflux disease 03/14/2018  ? Family history of ovarian cancer 11/20/2015  ? Seasonal allergies 03/11/2015  ? Allergic conjunctivitis and rhinitis 03/04/2015   ? Morbid obesity ([place]) 01/08/2014  ? Panic disorder with agoraphobia 12/23/2012  ? Generalized anxiety disorder 12/13/2012  ? Adjustment disorder with anxiety 12/09/2012  ? Major depression, single episode 12/09/2012  ? Depression 06/12/2010  ? BENIGN POSITIONAL VERTIGO 01/15/2010  ? HYPERTENSION, BENIGN 03/11/2009  ? Other insomnia 03/11/2009  ? Diaphragmatic hernia 04/23/2008  ? ANEMIA, IRON DEFICIENCY 12/09/2006  ? Migraine headache 12/09/2006  ? PALPITATIONS 12/09/2006  ? ? ?Past Surgical History:  ?Procedure Laterality Date  ? BREAST BIOPSY    ? BREAST EXCISIONAL BIOPSY Left 2000  ? [facility]  ? CHOLECYSTECTOMY  1987  ? ENDOMETRIAL ABLATION  09/30/2006  ? ESOPHAGOGASTRODUODENOSCOPY    ? Showed grade D. reflux esophagitis  ? TOTAL VAGINAL HYSTERECTOMY  09/27/2008  ? Dr. Huntley Estelle  ? ? ?OB History   ? ? Gravida  ?4  ? Para  ?2  ? Term  ?2  ? Preterm  ?   ? AB  ?2  ? Living  ?   ?  ? ? SAB  ?2  ? IAB  ?   ? Ectopic  ?   ? Multiple  ?   ? Live Births  ?2  ?   ?  ?  ? ? ? ?Home Medications   ? ?Prior to Admission medications   ?Medication Sig Start Date End Date Taking? Authorizing Provider  ?ACCU-CHEK GUIDE test  strip USE TO CHECK BLOOD SUGAR ONCE A DAY 04/11/21  Yes Hali Marry, MD  ?Accu-Chek Softclix Lancets lancets USE TO TEST SUGAR ONCE A DAY 03/27/21  Yes Hali Marry, MD  ?albuterol (VENTOLIN HFA) 108 (90 Base) MCG/ACT inhaler Inhale 2 puffs into the lungs every 6 (six) hours as needed for wheezing or shortness of breath. 04/10/21  Yes Hali Marry, MD  ?amoxicillin (AMOXIL) 875 MG tablet Take one tab PO Q12hr 05/12/21  Yes Levander Katzenstein, Ishmael Holter, MD  ?blood glucose meter kit and supplies Dispense based on patient and insurance preference. Check blood sugar once as directed. R73.03 01/21/21  Yes Hali Marry, MD  ?buPROPion (WELLBUTRIN XL) 150 MG 24 hr tablet Take 1 tablet (150 mg total) by mouth daily. 10/28/20  Yes Hali Marry, MD  ?cholecalciferol  (VITAMIN D) 1000 units tablet Take 2,000 Units by mouth daily.   Yes [provider]  ?diclofenac Sodium (VOLTAREN) 1 % GEL APPLY 4 G TOPICALLY 4 TIMES DAILY 09/26/20  Yes Hali Marry, MD  ?furosemide (LASIX) 20 MG tablet TAKE 1 TABLET BY MOUTH EVERY DAY AS NEEDED 03/03/21  Yes Hali Marry, MD  ?gabapentin (NEURONTIN) 300 MG capsule Take 2 capsules (600 mg total) by mouth 3 (three) times daily. 03/21/19  Yes Gregor Hams, MD  ?HYDROcodone bit-homatropine (HYCODAN) 5-1.5 MG/5ML syrup Take 5 mLs by mouth every 8 (eight) hours as needed for cough. 04/10/21  Yes Hali Marry, MD  ?hydroxychloroquine (PLAQUENIL) 200 MG tablet Take 200 mg by mouth 2 (two) times daily. 12/28/20  Yes [provider]  ?ipratropium (ATROVENT) 0.03 % nasal spray Place 2 sprays into both nostrils every 12 (twelve) hours. 07/05/19  Yes Hali Marry, MD  ?lisinopril (ZESTRIL) 20 MG tablet Take 1 tablet (20 mg total) by mouth daily. 02/18/21  Yes Hali Marry, MD  ?lisinopril-hydrochlorothiazide (ZESTORETIC) 20-25 MG tablet TAKE 1 TABLET BY MOUTH EVERY DAY 03/03/21  Yes Hali Marry, MD  ?meloxicam (MOBIC) 15 MG tablet TAKE 1 TABLET (15 MG TOTAL) BY MOUTH DAILY. PRN 10/16/20  Yes Hali Marry, MD  ?montelukast (SINGULAIR) 10 MG tablet TAKE 1 TABLET BY MOUTH EVERYDAY AT BEDTIME 04/13/19  Yes Hali Marry, MD  ?nystatin (MYCOSTATIN/NYSTOP) powder Apply to affected area 3 times daily 06/04/20 06/04/21 Yes Hali Marry, MD  ?nystatin ointment (MYCOSTATIN) Apply 1 application topically 2 (two) times daily. 06/04/20  Yes Hali Marry, MD  ?omeprazole (PRILOSEC) 20 MG capsule Take 1-2 capsules (20-40 mg total) by mouth daily. TAKE 1-2 CAPSULES (20-40 MG TOTAL) BY MOUTH DAILY. 10/16/20  Yes Hali Marry, MD  ?predniSONE (DELTASONE) 20 MG tablet Take one tab PO Q8hr. Take with food. 05/12/21  Yes Kandra Nicolas, MD  ?rizatriptan (MAXALT) 10 MG tablet  Take 1 tablet (10 mg total) by mouth as needed. May repeat in 2 hours if needed 01/15/21  Yes Hali Marry, MD  ?tacrolimus (PROTOPIC) 0.1 % ointment Apply 1 application topically 2 (two) times daily. 12/19/20  Yes [provider]  ?topiramate (TOPAMAX) 25 MG tablet Take 25 mg by mouth 2 (two) times daily. 01/17/21  Yes [provider]  ?valACYclovir (VALTREX) 1000 MG tablet Take 1 tablet (1,000 mg total) by mouth 2 (two) times daily. When needed for outbreatk 01/04/20  Yes Early, Coralee Pesa, NP  ?ALPRAZolam (XANAX) 0.25 MG tablet Take 1 tablet (0.25 mg total) by mouth daily as needed for anxiety. 90 day supply 05/13/21  Hali Marry, MD  ?DULoxetine (CYMBALTA) 60 MG capsule TAKE 1 CAPSULE BY MOUTH EVERY DAY 05/13/21   Hali Marry, MD  ? ? ?Family History ?Family History  ?Problem Relation Age of Onset  ? Alcohol abuse Father   ? Stroke Father   ? Hypertension Father   ? Ovarian cancer Mother   ? Depression Mother   ? Diabetes Mother   ? Hyperlipidemia Mother   ? Hypertension Mother   ? COPD Mother   ? Heart attack Sister 79  ? Breast cancer Sister   ? ? ?Social History ?Social History  ? ?Tobacco Use  ? Smoking status: Never  ? Smokeless tobacco: Never  ?Substance Use Topics  ? Alcohol use: No  ? Drug use: No  ? ? ? ?Allergies   ?Banana, Clonazepam, and Zolpidem ? ? ?Review of Systems ?Review of Systems  ?Constitutional:  Negative for activity change, appetite change, chills, diaphoresis, fatigue and fever.  ?HENT:  Positive for dental problem and facial swelling. Negative for congestion, drooling, ear pain, hearing loss, mouth sores, rhinorrhea, sinus pressure, sinus pain, sore throat, tinnitus, trouble swallowing and voice change.   ?Eyes:   ?     Right eyelid weakness  ?Respiratory: Negative.    ?Cardiovascular: Negative.   ?Gastrointestinal: Negative.   ?Genitourinary: Negative.   ?Musculoskeletal: Negative.   ?Skin: Negative.   ?Neurological:  Positive for facial  asymmetry and numbness. Negative for dizziness, tremors, seizures, syncope, speech difficulty, weakness, light-headedness and headaches.  ?Hematological:  Negative for adenopathy.  ? ? ?Physical Exam ?Triage [facility]

## 2021-05-13 ENCOUNTER — Ambulatory Visit: Payer: BC Managed Care – PPO | Admitting: Family Medicine

## 2021-05-13 ENCOUNTER — Telehealth: Payer: Self-pay | Admitting: Family Medicine

## 2021-05-13 ENCOUNTER — Encounter: Payer: Self-pay | Admitting: Family Medicine

## 2021-05-13 VITALS — BP 132/70 | HR 89 | Resp 18 | Ht 62.0 in | Wt 234.0 lb

## 2021-05-13 DIAGNOSIS — F4321 Adjustment disorder with depressed mood: Secondary | ICD-10-CM | POA: Diagnosis not present

## 2021-05-13 DIAGNOSIS — G510 Bell's palsy: Secondary | ICD-10-CM

## 2021-05-13 DIAGNOSIS — F430 Acute stress reaction: Secondary | ICD-10-CM

## 2021-05-13 DIAGNOSIS — F410 Panic disorder [episodic paroxysmal anxiety] without agoraphobia: Secondary | ICD-10-CM

## 2021-05-13 DIAGNOSIS — R300 Dysuria: Secondary | ICD-10-CM

## 2021-05-13 LAB — POCT URINALYSIS DIP (CLINITEK)
Bilirubin, UA: NEGATIVE
Glucose, UA: NEGATIVE mg/dL
Ketones, POC UA: NEGATIVE mg/dL
Leukocytes, UA: NEGATIVE
Nitrite, UA: NEGATIVE
POC PROTEIN,UA: NEGATIVE
Spec Grav, UA: >=1.03 — AB (ref 1.010–1.025)
Urobilinogen, UA: 0.2 E.U./dL
pH, UA: 6 (ref 5.0–8.0)

## 2021-05-13 MED ORDER — DULOXETINE HCL 60 MG PO CPEP: ORAL_CAPSULE | ORAL | 1 refills | Status: DC

## 2021-05-13 MED ORDER — ALPRAZOLAM 0.25 MG PO TABS: 0.2500 mg | ORAL_TABLET | Freq: Every day | ORAL | 0 refills | Status: DC | PRN

## 2021-05-13 NOTE — Progress Notes (Signed)
? ?Established Patient Office Visit ? ?Subjective   ?Patient ID: Kristen Spence, female    DOB: [birth year]  Age: 61 y.o. MRN: [id number] ? ?Chief Complaint  ?Patient presents with  ? Follow-up  ?  Bell's palsy  ? ? ?HPI ? ?She is here today for follow-up from previous visit.  She was seen by one of my partners about 3 weeks ago for elevated blood pressure as well as some chest discomfort.  Got it as a heaviness fluttering sensation.  She says she just found out that she was going to have to come back into the office for work and feels like she was really just having panic attacks but wanted to make sure that everything was okay.  She has scheduled an appointment next month to get back in with her therapist that she has not seen for a while she has a history of PTSD and complicated grief.  She also brought in some paperwork for [place] to complete for work to see if she could continue to work from home at least for an extended period of time. ? ?He was also recently seen in the emergency department for kidney stones and UTI.  To repeat a urine today just to make sure there was no sign of infection. ? ?She was also seen yesterday for facial drooping on the right side.  She said on 2 days ago she noticed that her mouth and the tip of her tongue felt a little bit numb and then the next day realized that the side of her face felt numb.  She was worried about the possibility of stroke so went to see Dr. Dalbert Garnet at our urgent care he diagnosed her with Bell's palsy and placed her on amoxicillin and prednisone. She had had some pressure and itching in that right ear.  No significant pain. ? ? ? ? ?ROS ? ?  ?Objective:  ?  ? ?BP 132/70   Pulse 89   Resp 18   Ht 5\' 2"  (1.575 m)   Wt 234 lb (106.1 kg)   SpO2 94%   BMI 42.80 kg/m?  ? ? ?Physical Exam ?Vitals and nursing note reviewed.  ?Constitutional:   ?   Appearance: Normal appearance. She is well-developed.  ?HENT:  ?   Head: Normocephalic and atraumatic.  ?   Comments:  Right canal appears to be normal.  Part of the TM is blocked by a little bit of cerumen what I can visualize looks okay I do not see any rash or blisters or significant erythema. ?Cardiovascular:  ?   Rate and Rhythm: Normal rate and regular rhythm.  ?   Heart sounds: Normal heart sounds.  ?Pulmonary:  ?   Effort: Pulmonary effort is normal.  ?   Breath sounds: Normal breath sounds.  ?Musculoskeletal:  ?   Cervical back: Neck supple. No tenderness.  ?Lymphadenopathy:  ?   Cervical: No cervical adenopathy.  ?Skin: ?   General: Skin is warm and dry.  ?Neurological:  ?   Mental Status: She is alert and oriented to person, place, and time.  ?   Comments: She has facial droop on the right side including her eyebrow region and her facial cheek and side of her mouth.  No rash or swelling.  ?Psychiatric:     ?   Behavior: Behavior normal.  ? ? ? ?Results for orders placed or performed in visit on 05/13/21  ?POCT URINALYSIS DIP (CLINITEK)  ?Result Value Ref Range  ?  Color, UA yellow yellow  ? Clarity, UA clear clear  ? Glucose, UA negative negative mg/dL  ? Bilirubin, UA negative negative  ? Ketones, POC UA negative negative mg/dL  ? Spec Grav, UA >=1.030 (A) 1.010 - 1.025  ? Blood, UA moderate (A) negative  ? pH, UA 6.0 5.0 - 8.0  ? POC PROTEIN,UA negative negative, trace  ? Urobilinogen, UA 0.2 0.2 or 1.0 E.U./dL  ? Nitrite, UA Negative Negative  ? Leukocytes, UA Negative Negative  ? ? ? ? ?The ASCVD Risk score (Arnett DK, et al., 2019) failed to calculate for the following reasons: ?  Unable to determine if patient is Non-Hispanic African American ? ?  ?Assessment & Plan:  ? ?Problem List Items Addressed This Visit   ? ?  ? Other  ? Complicated grief  ? Relevant Medications  ? ALPRAZolam (XANAX) 0.25 MG tablet  ? DULoxetine (CYMBALTA) 60 MG capsule  ? ?Other Visit Diagnoses   ? ? Dysuria    -  Primary  ? Relevant Orders  ? POCT URINALYSIS DIP (CLINITEK) (Completed)  ? Urinalysis, microscopic only  ? Bell's palsy      ?  Relevant Medications  ? ALPRAZolam (XANAX) 0.25 MG tablet  ? DULoxetine (CYMBALTA) 60 MG capsule  ? Panic attack as reaction to stress      ? Relevant Medications  ? ALPRAZolam (XANAX) 0.25 MG tablet  ? DULoxetine (CYMBALTA) 60 MG capsule  ? ?  ? ? ?Bell's palsy-symptoms most consistent with Bell's palsy versus stroke.  We discussed continue with the prednisone and the amoxicillin that she is already started.  If that right ear becomes more painful or she loses hearing please let us know immediately. ? ?Recent UTI-repeat urinalysis does not show any sign of infection but did she does still have some blood.  Will send for microscopic review she has not seen any gross hematuria. ? ?Panic attacks-we will complete forms for work so that she can hopefully continue to work from home.  She is getting back in with her therapist starting next month which I think will be helpful as well. ? ? ? ?No follow-ups on file.  ? ? ?Nani Gasser, MD ? ?

## 2021-05-13 NOTE — Telephone Encounter (Signed)
Form completed,faxed,confirmation received and scanned into patient's chart. 

## 2021-05-13 NOTE — Telephone Encounter (Signed)
Forms completed for work on accommodation to be able to work from home.  Placed in [place] B's box. ?

## 2021-05-14 LAB — URINALYSIS, MICROSCOPIC ONLY: Hyaline Cast: NONE SEEN /LPF

## 2021-05-14 NOTE — Progress Notes (Signed)
No whole red blood cells on the microscopic sample which is very reassuring.

## 2021-05-16 ENCOUNTER — Ambulatory Visit: Payer: BC Managed Care – PPO | Admitting: Family Medicine

## 2021-05-20 ENCOUNTER — Other Ambulatory Visit: Payer: Self-pay

## 2021-05-20 DIAGNOSIS — F4321 Adjustment disorder with depressed mood: Secondary | ICD-10-CM

## 2021-05-20 DIAGNOSIS — J22 Unspecified acute lower respiratory infection: Secondary | ICD-10-CM

## 2021-05-20 MED ORDER — ALBUTEROL SULFATE HFA 108 (90 BASE) MCG/ACT IN AERS: 2.0000 | INHALATION_SPRAY | Freq: Four times a day (QID) | RESPIRATORY_TRACT | 1 refills | Status: DC | PRN

## 2021-05-20 MED ORDER — DULOXETINE HCL 60 MG PO CPEP: ORAL_CAPSULE | ORAL | 1 refills | Status: DC

## 2021-06-10 ENCOUNTER — Ambulatory Visit: Payer: BC Managed Care – PPO | Admitting: Professional

## 2021-06-12 ENCOUNTER — Other Ambulatory Visit: Payer: Self-pay | Admitting: Family Medicine

## 2021-06-12 DIAGNOSIS — B372 Candidiasis of skin and nail: Secondary | ICD-10-CM

## 2021-06-19 ENCOUNTER — Other Ambulatory Visit: Payer: Self-pay | Admitting: Family Medicine

## 2021-06-24 ENCOUNTER — Ambulatory Visit (INDEPENDENT_AMBULATORY_CARE_PROVIDER_SITE_OTHER): Payer: BC Managed Care – PPO | Admitting: Professional

## 2021-06-24 ENCOUNTER — Encounter: Payer: Self-pay | Admitting: Professional

## 2021-06-24 DIAGNOSIS — F321 Major depressive disorder, single episode, moderate: Secondary | ICD-10-CM | POA: Diagnosis not present

## 2021-06-24 NOTE — Progress Notes (Signed)
[facility] Counselor/Therapist Progress Note  Patient ID: Kristen CastleBrenda K Tapp, MRN: [id number],    Date: 06/24/2021  Time Spent: 53 minutes 1156- 1249pm  Treatment Type: Individual Therapy  Risk Assessment: Danger to Self:  No Self-injurious Behavior: No Danger to Others: No  Subjective: This session was held via video teletherapy. The patient consented to video teletherapy and was located at her home during this session. She is aware it is the responsibility of the patient to secure confidentiality on her end of the session. The provider was in a private home office for the duration of this session.   The patient arrived on time for her Caregility appointment.  Issues addressed: 1- grief -is doing better -was very difficult the week before and the week of his Oct 2022 death anniversary -her in-laws have been very difficult   -husband's mother calling to wan to know what he left her     -she wanted husband's truck -brother in law called and wanted keys to church so his mother could sell the church   -pt ended up having to hang up on the pt 2-in-laws -she and her spouse were taking care of her father in law due to health issues -mother in law was in pts home and was emotionally upsetting her husband   -mother in law was asked to leave son's home -pt found out that her spouse was half owner of his mother's home with his brother   -pt will have to deal with this again when her mother in law dies 3-pt ran into her granddaughter who was with her paternal grandmother -paternal grandmother told the pt her daughter was nearby -pt's daughter is estranged and she does not now why -pt admitted she started to cry about it   -she then decided not to be sad because "I can't change it"    Treatment Plan Problems Addressed  Anxiety, Childhood Trauma, Grief / Loss Unresolved, Unipolar Depression  Goals 1. Begin a healthy grieving process around the loss. Objective Verbalize  resolution of feelings of guilt and regret associated with the loss. Target Date: 2021-08-14 Frequency: Biweekly  Progress: 0 Modality: individual  Related Interventions Assign the client to make a list of all the regrets associated with actions toward or relationship with the deceased; process the list content toward resolution of these feelings. Objective Begin verbalizing feelings associated with the loss. Target Date: 2021-08-14 Frequency: Biweekly  Progress: 0 Modality: individual  Related Interventions Assist the client in identifying and expressing feelings connected with his/her loss. Ask the client to bring pictures or mementos connected with his/her loss to a session and talk about them (or assign "Creating a [place] [facility] [employer]" in the Adult Psychotherapy Homework Planner by Stephannie LiJongsma). Objective Verbalize and resolve feelings of anger or guilt focused on self or deceased loved one that interfere with the grieving process. Target Date: 2021-08-14 Frequency: Biweekly  Progress: 0 Modality: individual  Related Interventions Encourage the client to forgive self and/or deceased to resolve his/her feelings of guilt or anger; recommend books on forgiveness (e.g., Forgive and Forget by Francina AmesSmedes). Use nondirective techniques (e.g., active listening, clarification, summarization, reflection) to allow the client to express and process angry feelings connected to his/her loss. Objective Reengage in activities with family, friends, coworkers, and others. Target Date: 2021-08-14 Frequency: Biweekly  Progress: 0 Modality: individual  Related Interventions Assist the client in recommitting and reengaging in the primary social positive roles in which he/she has functioned prior to the loss. Promote behavioral activation by  assisting the client in listing activities which he/she previously enjoyed but has not engaged in since experiencing the loss and then encourage reengagement in these activities (or  assign "Identify and Schedule Pleasant Activities" in the Adult Psychotherapy Homework Planner by Stephannie Li). Objective Acknowledge dependency on lost loved one and begin to refocus life on independent actions to meet emotional needs. Target Date: 2021-08-14 Frequency: Biweekly  Progress: 0 Modality: individual  Related Interventions Assist the client in identifying how he/she depended upon the significant other, expressing and resolving the accompanying feelings of abandonment and of being left alone. Explore the feelings of anger or guilt that surround the loss, helping the client understand the sources for such feelings. 2. Complete the process of letting go of the lost significant other. 3. Develop an awareness of how childhood issues have affected and continue to affect one's family life. 4. Develop an awareness of how the avoidance of grieving has affected life and begin the healing process. 5. Develop healthy interpersonal relationships that lead to the alleviation and help prevent the relapse of depression. Objective Learn and implement relapse prevention skills. Target Date: 2021-08-14 Frequency: Biweekly  Progress: 0 Modality: individual  Related Interventions Discuss with the client the distinction between a lapse and relapse, associating a lapse with a rather common, temporary setback that may involve, for example, re-experiencing a depressive thought and/or urge to withdraw or avoid (perhaps as related to some loss or conflict) and a relapse as a sustained return to a pattern of depressive thinking and feeling usually accompanied by interpersonal withdrawal and/or avoidance. Identify and rehearse with the client the management of future situations or circumstances in which lapses could occur. Build the client's relapse prevention skills by helping him/her identify early warning signs of relapse and rehearsing the use of skills learned during therapy to manage them. Objective Learn and  implement behavioral strategies to overcome depression. Target Date: 2021-08-14 Frequency: Biweekly  Progress: 0 Modality: individual  Related Interventions Assist the client in developing skills that increase the likelihood of deriving pleasure from behavioral activation (e.g., assertiveness skills, developing an exercise plan, less internal/more external focus, increased social involvement); reinforce success. 6. Enhance ability to effectively cope with the full variety of life's worries and anxieties. 7. Learn and implement coping skills that result in a reduction of anxiety and worry, and improved daily functioning. Objective Learn and implement relapse prevention strategies for managing possible future anxiety symptoms. Target Date: 2021-08-14 Frequency: Biweekly  Progress: 0 Modality: individual  Related Interventions Discuss with the client the distinction between a lapse and relapse, associating a lapse with an initial and reversible return of worry, anxiety symptoms, or urges to avoid, and relapse with the decision to continue the fearful and avoidant patterns. Identify and rehearse with the client the management of future situations or circumstances in which lapses could occur. Instruct the client to routinely use new therapeutic skills (e.g., relaxation, cognitive restructuring, exposure, and problem-solving) in daily life to address emergent worries, anxiety, and avoidant tendencies. Develop a "coping card" on which coping strategies and other important information (e.g., "Breathe deeply and relax," "Challenge unrealistic worries," "Use problem-solving") are written for the client's later use. Objective Maintain involvement in work, family, and social activities. Target Date: 2021-08-14 Frequency: Biweekly  Progress: 0 Modality: individual  Related Interventions Support the client in following through with work, family, and social activities rather than escaping or avoiding them to  focus on anxiety. Objective Learn to accept limitations in life and commit to tolerating, rather than  avoiding, unpleasant emotions while accomplishing meaningful goals. Target Date: 2021-08-14 Frequency: Biweekly  Progress: 0 Modality: individual  Related Interventions Use techniques from Acceptance and Commitment Therapy to help client accept uncomfortable realities such as lack of complete control, imperfections, and uncertainty and tolerate unpleasant emotions and thoughts in order to accomplish value-consistent goals. Objective Identify and engage in pleasant activities on a daily basis. Target Date: 2021-08-14 Frequency: Biweekly  Progress: 0 Modality: individual  Related Interventions Engage the client in behavioral activation, increasing the client's contact with sources of reward, identifying processes that inhibit activation, and teaching skills to solve life problems (or assign "Identify and Schedule Pleasant Activities" in the Adult Psychotherapy Homework Planner by Jongsma); use behavioral techniques such as instruction, rehearsal, role-playing, role reversal as needed to assist adoption into the client's daily life; reinforce success. 8. Recognize, accept, and cope with feelings of depression. 9. Release the emotions associated with past childhood/family issues, resulting in less resentment and more serenity. Objective Describe each family member and identify the role each played within the family. Target Date: 2021-08-14 Frequency: Biweekly  Progress: 0 Modality: individual  Related Interventions Assist the client in clarifying his/her role within the family and his/her feelings connected to that role. Objective Identify feelings associated with major traumatic incidents in childhood and with parental child-rearing patterns. Target Date: 2021-08-14 Frequency: Biweekly  Progress: 0 Modality: individual  Related Interventions Support and encourage the client when he/she begins  to express feelings of rage, sadness, fear, and rejection relating to family abuse or neglect. 10. Resolve the loss, reengaging in old relationships and initiating new contacts with others. 11. Stabilize anxiety level while increasing ability to function on a daily basis.  Diagnosis:Current moderate episode of major depressive disorder without prior episode [facility])  Plan:  -next appointment will be Tuesday, August 19, 2021 at 1pm

## 2021-06-24 NOTE — Progress Notes (Signed)
° ° ° ° ° ° ° ° ° ° ° ° ° ° °  Kristen Spence, LCMHC °

## 2021-06-25 ENCOUNTER — Other Ambulatory Visit: Payer: Self-pay | Admitting: Family Medicine

## 2021-07-16 DIAGNOSIS — H11431 Conjunctival hyperemia, right eye: Secondary | ICD-10-CM | POA: Diagnosis not present

## 2021-07-17 ENCOUNTER — Encounter: Payer: Self-pay | Admitting: Family Medicine

## 2021-07-17 ENCOUNTER — Ambulatory Visit: Payer: BC Managed Care – PPO | Admitting: Family Medicine

## 2021-07-17 ENCOUNTER — Telehealth: Payer: Self-pay | Admitting: Family Medicine

## 2021-07-17 VITALS — BP 127/59 | HR 74 | Resp 18 | Ht 62.0 in | Wt 235.0 lb

## 2021-07-17 DIAGNOSIS — R42 Dizziness and giddiness: Secondary | ICD-10-CM

## 2021-07-17 DIAGNOSIS — R519 Headache, unspecified: Secondary | ICD-10-CM

## 2021-07-17 DIAGNOSIS — H5711 Ocular pain, right eye: Secondary | ICD-10-CM

## 2021-07-17 MED ORDER — AZITHROMYCIN 250 MG PO TABS: ORAL_TABLET | ORAL | 0 refills | Status: AC

## 2021-07-17 NOTE — Telephone Encounter (Signed)
Please see MyChart note sent to patient.   @[email or web address]@     

## 2021-07-17 NOTE — Progress Notes (Signed)
Acute Office Visit  Subjective:     Patient ID: Kristen Spence, female    DOB: March 04, 1960, 61 y.o.   MRN: [id number]  Chief Complaint  Patient presents with   eye concern    Patient states she is having pain and dizziness in right eye when looking up for 1 week. Patient saw eye dr. On 07/16/21. Eye Dr. Was unable to find a cause for the pain/dizziness.     HPI Patient is in today for Patient states she is having pain and dizziness in right eye when looking up for 1 week. Patient saw eye dr. On 07/16/21. Eye Dr. Was unable to find a cause for the pain/dizziness. She says it started about a week ago.  She started having a little bit of pain behind her right eye.  She thought initially was just eyestrain, from working on the computer.  But it progressively got worse.  She then noticed that she would feel little dizzy when she would look upward.  He says there is also a bandlike sensation going around her forehead to the back of her head and around.  She is also had some popping in her right ear but no significant nasal congestion or runny nose or drainage.  She says when she puts and holds pressure on the tear duct on that right eyes she actually gets a little bit of relief with the pressure.  She says earlier this week her son noted that she was a little swollen underneath her right eye.   She would really  like to lose about 100 lbs to get more healthy.   ROS      Objective:    BP (!) 127/59   Pulse 74   Resp 18   Ht 5\' 2"  (1.575 m)   Wt 235 lb (106.6 kg)   SpO2 98%   BMI 42.98 kg/m    Physical Exam Constitutional:      Appearance: She is well-developed.  HENT:     Head: Normocephalic and atraumatic.     Right Ear: External ear normal.     Left Ear: External ear normal.     Nose: Nose normal.  Eyes:     Conjunctiva/sclera: Conjunctivae normal.     Pupils: Pupils are equal, round, and reactive to light.  Neck:     Thyroid: No thyromegaly.  Cardiovascular:     Rate  and Rhythm: Normal rate and regular rhythm.     Heart sounds: Normal heart sounds.  Pulmonary:     Effort: Pulmonary effort is normal.     Breath sounds: Normal breath sounds. No wheezing.  Musculoskeletal:     Cervical back: Neck supple.  Lymphadenopathy:     Cervical: No cervical adenopathy.  Skin:    General: Skin is warm and dry.  Neurological:     Mental Status: She is alert and oriented to person, place, and time.     Comments: Experienced some vertigo with movement to the right with Dix-Hallpike maneuver.     No results found for any visits on 07/17/21.      Assessment & Plan:   Problem List Items Addressed This Visit   None Visit Diagnoses     Eye pain, right    -  Primary   Acute nonintractable headache, unspecified headache type       Vertigo          Right eye pain-unclear etiology she has been to her eye doctor and they felt  like everything looked normal on exam.  Consider potential causes including sinus pain and pressure.  She is having some fullness and popping in that right ear as well.  She is got a go ahead and restart her Claritin over-the-counter and will have her take a Z-Pak over the weekend to see if this helps if its not encouraged her to reach back out.  She has tried Flonase in the past but it makes her nauseated.  Meds ordered this encounter  Medications   azithromycin (ZITHROMAX) 250 MG tablet    Sig: 2 Ttabs PO on Day 1, then one a day x 4 days.    Dispense:  6 tablet    Refill:  0    No follow-ups on file.  Nani Gasser, MD

## 2021-07-25 ENCOUNTER — Encounter: Payer: Self-pay | Admitting: Family Medicine

## 2021-07-25 DIAGNOSIS — H5711 Ocular pain, right eye: Secondary | ICD-10-CM

## 2021-07-25 DIAGNOSIS — R42 Dizziness and giddiness: Secondary | ICD-10-CM

## 2021-07-25 DIAGNOSIS — R519 Headache, unspecified: Secondary | ICD-10-CM

## 2021-08-06 ENCOUNTER — Other Ambulatory Visit: Payer: Self-pay | Admitting: Family Medicine

## 2021-08-06 ENCOUNTER — Ambulatory Visit: Payer: BC Managed Care – PPO | Admitting: Professional

## 2021-08-06 NOTE — Telephone Encounter (Signed)
Orders Placed This Encounter  Procedures   MR Brain W Wo Contrast    Standing Status:   Future    Standing Expiration Date:   08/07/2022    Order Specific Question:   If indicated for the ordered procedure, I authorize the administration of contrast media per Radiology protocol    Answer:   Yes    Order Specific Question:   What is the patient's sedation requirement?    Answer:   No Sedation    Order Specific Question:   Does the patient have a pacemaker or implanted devices?    Answer:   No    Order Specific Question:   Preferred imaging location?    Answer:   [facility] (table limit-350lbs)

## 2021-08-11 ENCOUNTER — Other Ambulatory Visit: Payer: Self-pay | Admitting: Family Medicine

## 2021-08-19 ENCOUNTER — Other Ambulatory Visit: Payer: Self-pay | Admitting: Family Medicine

## 2021-08-19 ENCOUNTER — Ambulatory Visit (INDEPENDENT_AMBULATORY_CARE_PROVIDER_SITE_OTHER): Payer: BC Managed Care – PPO

## 2021-08-19 ENCOUNTER — Ambulatory Visit (INDEPENDENT_AMBULATORY_CARE_PROVIDER_SITE_OTHER): Payer: BC Managed Care – PPO | Admitting: Professional

## 2021-08-19 ENCOUNTER — Encounter: Payer: Self-pay | Admitting: Professional

## 2021-08-19 DIAGNOSIS — F321 Major depressive disorder, single episode, moderate: Secondary | ICD-10-CM | POA: Diagnosis not present

## 2021-08-19 DIAGNOSIS — H5711 Ocular pain, right eye: Secondary | ICD-10-CM

## 2021-08-19 DIAGNOSIS — R519 Headache, unspecified: Secondary | ICD-10-CM | POA: Diagnosis not present

## 2021-08-19 DIAGNOSIS — R42 Dizziness and giddiness: Secondary | ICD-10-CM

## 2021-08-19 DIAGNOSIS — F4321 Adjustment disorder with depressed mood: Secondary | ICD-10-CM

## 2021-08-19 DIAGNOSIS — H5789 Other specified disorders of eye and adnexa: Secondary | ICD-10-CM | POA: Diagnosis not present

## 2021-08-19 MED ORDER — GADOBUTROL 1 MMOL/ML IV SOLN
10.0000 mL | Freq: Once | INTRAVENOUS | Status: AC | PRN
  Administered 2021-08-19: 10 mL via INTRAVENOUS

## 2021-08-19 NOTE — Progress Notes (Signed)
[facility] Counselor/Therapist Progress Note  Patient ID: Kristen Spence, MRN: [id number],    Date: 08/19/2021  Time Spent: 40 minutes 111- 151pm  Treatment Type: Individual Therapy  Risk Assessment: Danger to Self:  No Self-injurious Behavior: No Danger to Others: No  Subjective: This session was held via video teletherapy. The patient consented to audio and was located at her home during this session. She is aware it is the responsibility of the patient to secure confidentiality on her end of the session. The provider was in a private home office for the duration of this session.   The patient arrived late for her appointment due to computer issues. The session was conducted via telephone at pt's request.  Issues addressed: 1- grief -pt has began going through her deceased spouse's clothing 2-adopted a dog, a schweenie, and it has helped a lot -brought by her godson -her name is Valentina Gu and she has been a really good help   -she is two years old and ten pounds   -she is a really very gentle dog -it has helped with her mental health -she had lost Rusty, her other daschund 3-son -his wife is still very jealous of her relationship with his son 4-professional -she has tried to go to work and it caused her to be very anxious -still needs to work but has struggled to return to office 5-anxiety -still working on getting under control when triggered -doing a little at a time until you feel more comfortable 6-measurable objectives/yearly treatment plan -reviewed objectives with pt to chart her progress -pt admits that she has made progress but needs to continue to address plan as written  Treatment Plan Problems Addressed  Anxiety, Childhood Trauma, Grief / Loss Unresolved, Unipolar Depression  Goals 1. Begin a healthy grieving process around the loss. Objective Verbalize resolution of feelings of guilt and regret associated with the loss. Target Date: 2022-08-14  Frequency: Biweekly  Progress: 30 Modality: individual  Related Interventions Assign the client to make a list of all the regrets associated with actions toward or relationship with the deceased; process the list content toward resolution of these feelings. Objective Begin verbalizing feelings associated with the loss. Target Date: 2022-08-14 Frequency: Biweekly  Progress: 50 Modality: individual  Related Interventions Assist the client in identifying and expressing feelings connected with his/her loss. Ask the client to bring pictures or mementos connected with his/her loss to a session and talk about them (or assign "Creating a [employer]" in the Adult Psychotherapy Homework Planner by Stephannie Li). Objective Verbalize and resolve feelings of anger or guilt focused on self or deceased loved one (in-laws) that interfere with the grieving process. Target Date: 2022-08-14 Frequency: Biweekly  Progress: 30 Modality: individual  Related Interventions Encourage the client to forgive self and/or deceased to resolve his/her feelings of guilt or anger; recommend books on forgiveness (e.g., Forgive and Forget by Francina Ames). Use nondirective techniques (e.g., active listening, clarification, summarization, reflection) to allow the client to express and process angry feelings connected to his/her loss. Objective Reengage in activities with family, friends, coworkers, and others. Target Date: 2022-08-14 Frequency: Biweekly  Progress: 40 Modality: individual  Related Interventions Assist the client in recommitting and reengaging in the primary social positive roles in which he/she has functioned prior to the loss. Promote behavioral activation by assisting the client in listing activities which he/she previously enjoyed but has not engaged in since experiencing the loss and then encourage reengagement in these activities (or assign "Identify and Schedule Pleasant  Activities" in the Adult Psychotherapy  Homework Planner by Stephannie Li). Objective Acknowledge dependency on lost loved one and begin to refocus life on independent actions to meet emotional needs. Target Date: 2022-08-14 Frequency: Biweekly  Progress: 50 Modality: individual  Related Interventions Assist the client in identifying how he/she depended upon the significant other, expressing and resolving the accompanying feelings of abandonment and of being left alone. Explore the feelings of anger or guilt that surround the loss, helping the client understand the sources for such feelings. 2. Complete the process of letting go of the lost significant other. 3. Develop an awareness of how childhood issues have affected and continue to affect one's family life. 4. Develop an awareness of how the avoidance of grieving has affected life and begin the healing process. 5. Develop healthy interpersonal relationships that lead to the alleviation and help prevent the relapse of depression. Objective Learn and implement relapse prevention skills. Target Date: 2022-08-14 Frequency: Biweekly  Progress: 20 Modality: individual  Related Interventions Discuss with the client the distinction between a lapse and relapse, associating a lapse with a rather common, temporary setback that may involve, for example, re-experiencing a depressive thought and/or urge to withdraw or avoid (perhaps as related to some loss or conflict) and a relapse as a sustained return to a pattern of depressive thinking and feeling usually accompanied by interpersonal withdrawal and/or avoidance. Identify and rehearse with the client the management of future situations or circumstances in which lapses could occur. Build the client's relapse prevention skills by helping him/her identify early warning signs of relapse and rehearsing the use of skills learned during therapy to manage them. Objective Learn and implement behavioral strategies to overcome depression. Target Date:  2022-08-14 Frequency: Biweekly  Progress: 20 Modality: individual  Related Interventions Assist the client in developing skills that increase the likelihood of deriving pleasure from behavioral activation (e.g., assertiveness skills, developing an exercise plan, less internal/more external focus, increased social involvement); reinforce success. 6. Enhance ability to effectively cope with the full variety of life's worries and anxieties. 7. Learn and implement coping skills that result in a reduction of anxiety and worry, and improved daily functioning. Objective Learn and implement relapse prevention strategies for managing possible future anxiety symptoms. Target Date: 2022-08-14 Frequency: Biweekly  Progress: 20 Modality: individual  Related Interventions Discuss with the client the distinction between a lapse and relapse, associating a lapse with an initial and reversible return of worry, anxiety symptoms, or urges to avoid, and relapse with the decision to continue the fearful and avoidant patterns. Identify and rehearse with the client the management of future situations or circumstances in which lapses could occur. Instruct the client to routinely use new therapeutic skills (e.g., relaxation, cognitive restructuring, exposure, and problem-solving) in daily life to address emergent worries, anxiety, and avoidant tendencies. Develop a "coping card" on which coping strategies and other important information (e.g., "Breathe deeply and relax," "Challenge unrealistic worries," "Use problem-solving") are written for the client's later use. Objective Maintain involvement in work, family, and social activities. Target Date: 2022-08-14 Frequency: Biweekly  Progress: 25 Modality: individual  Related Interventions Support the client in following through with work, family, and social activities rather than escaping or avoiding them to focus on anxiety. Objective Learn to accept limitations in life  and commit to tolerating, rather than avoiding, unpleasant emotions while accomplishing meaningful goals. Target Date: 2022-08-14 Frequency: Biweekly  Progress: 30 Modality: individual  Related Interventions Use techniques from Acceptance and Commitment Therapy to help client accept uncomfortable  realities such as lack of complete control, imperfections, and uncertainty and tolerate unpleasant emotions and thoughts in order to accomplish value-consistent goals. Objective Identify and engage in pleasant activities on a daily basis. Target Date: 2022-08-14 Frequency: Biweekly  Progress: 15 Modality: individual  Related Interventions Engage the client in behavioral activation, increasing the client's contact with sources of reward, identifying processes that inhibit activation, and teaching skills to solve life problems (or assign "Identify and Schedule Pleasant Activities" in the Adult Psychotherapy Homework Planner by Jongsma); use behavioral techniques such as instruction, rehearsal, role-playing, role reversal as needed to assist adoption into the client's daily life; reinforce success. 8. Recognize, accept, and cope with feelings of depression. 9. Release the emotions associated with past childhood/family issues, resulting in less resentment and more serenity. Objective Describe each family member and identify the role each played within the family. Target Date: 2022-08-14 Frequency: Biweekly  Progress: 0 Modality: individual  Related Interventions Assist the client in clarifying his/her role within the family and his/her feelings connected to that role. Objective Identify feelings associated with major traumatic incidents in childhood and with parental child-rearing patterns. Target Date: 2022-08-14 Frequency: Biweekly  Progress: 0 Modality: individual  Related Interventions Support and encourage the client when he/she begins to express feelings of rage, sadness, fear, and rejection  relating to family abuse or neglect. 10. Resolve the loss, reengaging in old relationships and initiating new contacts with others. 11. Stabilize anxiety level while increasing ability to function on a daily basis.  Diagnosis:Current moderate episode of major depressive disorder without prior episode [facility])  Plan:  -next appointment will be Wednesday, September 17, 2021 at 1pm  [place], [facility]

## 2021-08-20 NOTE — Progress Notes (Signed)
Brain MRI looks normal. No worrisome findings to explain the eye pain or vertigo.  Have her schedule eye appt if hasn't already. We can refer to Neurology if still having frequent headaches

## 2021-08-29 ENCOUNTER — Ambulatory Visit
Admission: EM | Admit: 2021-08-29 | Discharge: 2021-08-29 | Discharge status: Against Medical Advice | Payer: BC Managed Care – PPO

## 2021-08-30 ENCOUNTER — Telehealth: Payer: Self-pay

## 2021-09-01 ENCOUNTER — Ambulatory Visit: Payer: BC Managed Care – PPO | Admitting: Family Medicine

## 2021-09-01 ENCOUNTER — Encounter: Payer: Self-pay | Admitting: Family Medicine

## 2021-09-01 VITALS — BP 117/60 | HR 79 | Ht 60.0 in | Wt 234.0 lb

## 2021-09-01 DIAGNOSIS — R300 Dysuria: Secondary | ICD-10-CM

## 2021-09-01 DIAGNOSIS — N3001 Acute cystitis with hematuria: Secondary | ICD-10-CM

## 2021-09-01 LAB — POCT URINALYSIS DIP (CLINITEK)
Bilirubin, UA: NEGATIVE
Glucose, UA: NEGATIVE mg/dL
Ketones, POC UA: NEGATIVE mg/dL
Nitrite, UA: POSITIVE — AB
POC PROTEIN,UA: 100 — AB
Spec Grav, UA: >=1.03 — AB (ref 1.010–1.025)
Urobilinogen, UA: 0.2 E.U./dL
pH, UA: 5.5 (ref 5.0–8.0)

## 2021-09-01 MED ORDER — FLUCONAZOLE 150 MG PO TABS: 150.0000 mg | ORAL_TABLET | Freq: Once | ORAL | 0 refills | Status: DC

## 2021-09-01 MED ORDER — NITROFURANTOIN MONOHYD MACRO 100 MG PO CAPS: 100.0000 mg | ORAL_CAPSULE | Freq: Two times a day (BID) | ORAL | 0 refills | Status: DC

## 2021-09-01 MED ORDER — ONDANSETRON HCL 4 MG PO TABS: 4.0000 mg | ORAL_TABLET | Freq: Three times a day (TID) | ORAL | 0 refills | Status: DC | PRN

## 2021-09-01 NOTE — Progress Notes (Signed)
    Acute Office Visit  Subjective:     Patient ID: NEFERTARI REBMAN, female    DOB: January 27, 1960, 61 y.o.   MRN: [id number]  Chief Complaint  Patient presents with   Dysuria    Hematuria     HPI Patient is in today for hematuria that started 3 days ago on Friday.  No fever today but had a fever on Friday.  She is also felt really nauseated but no vomiting.  She is also found a tick on her neck a couple days before the symptoms started.  She has not had any pain that is typical with her kidney stones.  She is just felt really tired.  ROS      Objective:    BP 117/60   Pulse 79   Ht 5' (1.524 m)   Wt 234 lb (106.1 kg)   SpO2 99%   BMI 45.70 kg/m    Physical Exam  Results for orders placed or performed in visit on 09/01/21  POCT URINALYSIS DIP (CLINITEK)  Result Value Ref Range   Color, UA straw (A) yellow   Clarity, UA hazy (A) clear   Glucose, UA negative negative mg/dL   Bilirubin, UA negative negative   Ketones, POC UA negative negative mg/dL   Spec Grav, UA >=6.720 (A) 1.010 - 1.025   Blood, UA large (A) negative   pH, UA 5.5 5.0 - 8.0   POC PROTEIN,UA =100 (A) negative, trace   Urobilinogen, UA 0.2 0.2 or 1.0 E.U./dL   Nitrite, UA Positive (A) Negative   Leukocytes, UA Moderate (2+) (A) Negative        Assessment & Plan:   Problem List Items Addressed This Visit   None Visit Diagnoses     Dysuria    -  Primary   Relevant Orders   POCT URINALYSIS DIP (CLINITEK) (Completed)   Urine Culture   Acute cystitis with hematuria       Relevant Medications   nitrofurantoin, macrocrystal-monohydrate, (MACROBID) 100 MG capsule       UTI-we will treat with Macrobid.  She does usually get a yeast infection with antibiotics was sent over prescription for Diflucan.  Call if nausea and other symptoms are not resolving pretty promptly.  We did go ahead and send a urine culture since she is having some gross hematuria.  Meds ordered this encounter  Medications    nitrofurantoin, macrocrystal-monohydrate, (MACROBID) 100 MG capsule    Sig: Take 1 capsule (100 mg total) by mouth 2 (two) times daily.    Dispense:  10 capsule    Refill:  0   fluconazole (DIFLUCAN) 150 MG tablet    Sig: Take 1 tablet (150 mg total) by mouth once for 1 dose.    Dispense:  2 tablet    Refill:  0   ondansetron (ZOFRAN) 4 MG tablet    Sig: Take 1 tablet (4 mg total) by mouth every 8 (eight) hours as needed for nausea or vomiting.    Dispense:  10 tablet    Refill:  0    No follow-ups on file.  Nani Gasser, MD

## 2021-09-03 NOTE — Progress Notes (Signed)
Hi Kristen Spence, urine cultures positive for E. coli.  I am still awaiting the sensitivities so that we can make sure that you are on the correct antibiotic.  Should be back in the morning.

## 2021-09-04 LAB — URINE CULTURE
MICRO NUMBER:: 13780393
SPECIMEN QUALITY:: ADEQUATE

## 2021-09-05 ENCOUNTER — Encounter: Payer: Self-pay | Admitting: Family Medicine

## 2021-09-05 DIAGNOSIS — N3001 Acute cystitis with hematuria: Secondary | ICD-10-CM

## 2021-09-05 MED ORDER — NITROFURANTOIN MONOHYD MACRO 100 MG PO CAPS: 100.0000 mg | ORAL_CAPSULE | Freq: Two times a day (BID) | ORAL | 0 refills | Status: DC

## 2021-09-05 NOTE — Progress Notes (Signed)
Hi Kristen Spence, your urine shows e coli bacteria.  The antibiotic I put you on should work based on the report.  I hope you are feeling better.

## 2021-09-05 NOTE — Telephone Encounter (Signed)
Sent in refill on RX  Meds ordered this encounter  Medications   nitrofurantoin, macrocrystal-monohydrate, (MACROBID) 100 MG capsule    Sig: Take 1 capsule (100 mg total) by mouth 2 (two) times daily.    Dispense:  10 capsule    Refill:  0

## 2021-09-17 ENCOUNTER — Ambulatory Visit (INDEPENDENT_AMBULATORY_CARE_PROVIDER_SITE_OTHER): Payer: BC Managed Care – PPO | Admitting: Professional

## 2021-09-17 ENCOUNTER — Encounter: Payer: Self-pay | Admitting: Professional

## 2021-09-17 DIAGNOSIS — F321 Major depressive disorder, single episode, moderate: Secondary | ICD-10-CM | POA: Diagnosis not present

## 2021-09-17 NOTE — Progress Notes (Signed)
[facility] Counselor/Therapist Progress Note  Patient ID: Kristen Spence, MRN: [id number],    Date: 09/17/2021  Time Spent: 43 minutes 103- 146pm  Treatment Type: Individual Therapy  Risk Assessment: Danger to Self:  No Self-injurious Behavior: No Danger to Others: No  Subjective: This session was held via video teletherapy. The patient consented to audio and was located at her home during this session. She is aware it is the responsibility of the patient to secure confidentiality on her end of the session. The provider was in a private home office for the duration of this session.   The patient arrived late for her appointment due to computer issues.   Issues addressed: 1- grief -pt has been up and down -having her dog Valentina Gu has been helpful -she has felt emotional times   -stressed doing repairs in kitchen   -has started going through her spouse's clothes     -realizes there is no need to hold on to his clothing     -never knew the quantity of clothing he possessed 2-husband's family -mother-in-law in nursing home now -his brother refused to take care of her and when pt's husband was alive he took care of her   -he never wanted her to go into nursing care -his brother will use you until he can't use you anymore   -"he's all about money" 3-son -he still visits his mom but she tells him to go home so that he doesn't get hassled by his wife -his wife asked pt's son if he just wanted to live with his mom -he had stayed several nights with his mom recently when his stepdaughter reportedly had Covid 4-social -pt is venturing out with her friends -was to have gone to beach but she had a kidney stone and infection   -God protected her because the friend was sick the entire weekend and still is sick -pt admits that she sometimes feels lonely but she is happy with Lucy's companionship -pt is at the point she has 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   -she wants  to detach from the memories she had with her husband   -she doesn't want to be looking over her shoulder, "they show up to show out"     -once his brother called and niece texted and cussed her out     -she reports that she had told her husband when he had Covid that none were supportive     -her husband always said while her mother is alive she is not my mother, my mother was not this type of woman     -brother-in-law called the lawyer and demanded the church bylaws       -they wanted to see about how to get the church and sell for money       -the attorney offered nothing to them because they were not his client, the pt was -pt wants peace in her life and will not permit anyone to upset that  Treatment Plan Problems Addressed  Anxiety, Childhood Trauma, Grief / Loss Unresolved, Unipolar Depression  Goals 1. Begin a healthy grieving process around the loss. Objective Verbalize resolution of feelings of guilt and regret associated with the loss. Target Date: 2022-08-14 Frequency: Biweekly  Progress: 30 Modality: individual  Related Interventions Assign the client to make a list of all the regrets associated with actions toward or relationship with the deceased; process the list content toward resolution of these feelings. Objective Begin verbalizing  feelings associated with the loss. Target Date: 2022-08-14 Frequency: Biweekly  Progress: 50 Modality: individual  Related Interventions Assist the client in identifying and expressing feelings connected with his/her loss. Ask the client to bring pictures or mementos connected with his/her loss to a session and talk about them (or assign "Creating a [place]" in the Adult Psychotherapy Homework Planner by Stephannie Li). Objective Verbalize and resolve feelings of anger or guilt focused on self or deceased loved one (in-laws) that interfere with the grieving process. Target Date: 2022-08-14 Frequency: Biweekly  Progress: 30 Modality:  individual  Related Interventions Encourage the client to forgive self and/or deceased to resolve his/her feelings of guilt or anger; recommend books on forgiveness (e.g., Forgive and Forget by Francina Ames). Use nondirective techniques (e.g., active listening, clarification, summarization, reflection) to allow the client to express and process angry feelings connected to his/her loss. Objective Reengage in activities with family, friends, coworkers, and others. Target Date: 2022-08-14 Frequency: Biweekly  Progress: 40 Modality: individual  Related Interventions Assist the client in recommitting and reengaging in the primary social positive roles in which he/she has functioned prior to the loss. Promote behavioral activation by assisting the client in listing activities which he/she previously enjoyed but has not engaged in since experiencing the loss and then encourage reengagement in these activities (or assign "Identify and Schedule Pleasant Activities" in the Adult Psychotherapy Homework Planner by Stephannie Li). Objective Acknowledge dependency on lost loved one and begin to refocus life on independent actions to meet emotional needs. Target Date: 2022-08-14 Frequency: Biweekly  Progress: 50 Modality: individual  Related Interventions Assist the client in identifying how he/she depended upon the significant other, expressing and resolving the accompanying feelings of abandonment and of being left alone. Explore the feelings of anger or guilt that surround the loss, helping the client understand the sources for such feelings. 2. Complete the process of letting go of the lost significant other. 3. Develop an awareness of how childhood issues have affected and continue to affect one's family life. 4. Develop an awareness of how the avoidance of grieving has affected life and begin the healing process. 5. Develop healthy interpersonal relationships that lead to the alleviation and help prevent the relapse  of depression. Objective Learn and implement relapse prevention skills. Target Date: 2022-08-14 Frequency: Biweekly  Progress: 20 Modality: individual  Related Interventions Discuss with the client the distinction between a lapse and relapse, associating a lapse with a rather common, temporary setback that may involve, for example, re-experiencing a depressive thought and/or urge to withdraw or avoid (perhaps as related to some loss or conflict) and a relapse as a sustained return to a pattern of depressive thinking and feeling usually accompanied by interpersonal withdrawal and/or avoidance. Identify and rehearse with the client the management of future situations or circumstances in which lapses could occur. Build the client's relapse prevention skills by helping him/her identify early warning signs of relapse and rehearsing the use of skills learned during therapy to manage them. Objective Learn and implement behavioral strategies to overcome depression. Target Date: 2022-08-14 Frequency: Biweekly  Progress: 20 Modality: individual  Related Interventions Assist the client in developing skills that increase the likelihood of deriving pleasure from behavioral activation (e.g., assertiveness skills, developing an exercise plan, less internal/more external focus, increased social involvement); reinforce success. 6. Enhance ability to effectively cope with the full variety of life's worries and anxieties. 7. Learn and implement coping skills that result in a reduction of anxiety and worry, and improved daily functioning. Objective  Learn and implement relapse prevention strategies for managing possible future anxiety symptoms. Target Date: 2022-08-14 Frequency: Biweekly  Progress: 20 Modality: individual  Related Interventions Discuss with the client the distinction between a lapse and relapse, associating a lapse with an initial and reversible return of worry, anxiety symptoms, or urges to avoid,  and relapse with the decision to continue the fearful and avoidant patterns. Identify and rehearse with the client the management of future situations or circumstances in which lapses could occur. Instruct the client to routinely use new therapeutic skills (e.g., relaxation, cognitive restructuring, exposure, and problem-solving) in daily life to address emergent worries, anxiety, and avoidant tendencies. Develop a "coping card" on which coping strategies and other important information (e.g., "Breathe deeply and relax," "Challenge unrealistic worries," "Use problem-solving") are written for the client's later use. Objective Maintain involvement in work, family, and social activities. Target Date: 2022-08-14 Frequency: Biweekly  Progress: 25 Modality: individual  Related Interventions Support the client in following through with work, family, and social activities rather than escaping or avoiding them to focus on anxiety. Objective Learn to accept limitations in life and commit to tolerating, rather than avoiding, unpleasant emotions while accomplishing meaningful goals. Target Date: 2022-08-14 Frequency: Biweekly  Progress: 30 Modality: individual  Related Interventions Use techniques from Acceptance and Commitment Therapy to help client accept uncomfortable realities such as lack of complete control, imperfections, and uncertainty and tolerate unpleasant emotions and thoughts in order to accomplish value-consistent goals. Objective Identify and engage in pleasant activities on a daily basis. Target Date: 2022-08-14 Frequency: Biweekly  Progress: 15 Modality: individual  Related Interventions Engage the client in behavioral activation, increasing the client's contact with sources of reward, identifying processes that inhibit activation, and teaching skills to solve life problems (or assign "Identify and Schedule Pleasant Activities" in the Adult Psychotherapy Homework Planner by Jongsma); use  behavioral techniques such as instruction, rehearsal, role-playing, role reversal as needed to assist adoption into the client's daily life; reinforce success. 8. Recognize, accept, and cope with feelings of depression. 9. Release the emotions associated with past childhood/family issues, resulting in less resentment and more serenity. Objective Describe each family member and identify the role each played within the family. Target Date: 2022-08-14 Frequency: Biweekly  Progress: 0 Modality: individual  Related Interventions Assist the client in clarifying his/her role within the family and his/her feelings connected to that role. Objective Identify feelings associated with major traumatic incidents in childhood and with parental child-rearing patterns. Target Date: 2022-08-14 Frequency: Biweekly  Progress: 0 Modality: individual  Related Interventions Support and encourage the client when he/she begins to express feelings of rage, sadness, fear, and rejection relating to family abuse or neglect. 10. Resolve the loss, reengaging in old relationships and initiating new contacts with others. 11. Stabilize anxiety level while increasing ability to function on a daily basis.  Diagnosis:Current moderate episode of major depressive disorder without prior episode [facility])  Plan:  -next appointment will be Wednesday, October 15, 2021 at 1pm  [place], [facility]

## 2021-09-24 ENCOUNTER — Other Ambulatory Visit: Payer: Self-pay | Admitting: Family Medicine

## 2021-09-24 DIAGNOSIS — K2100 Gastro-esophageal reflux disease with esophagitis, without bleeding: Secondary | ICD-10-CM

## 2021-09-24 DIAGNOSIS — I10 Essential (primary) hypertension: Secondary | ICD-10-CM

## 2021-09-24 DIAGNOSIS — F4321 Adjustment disorder with depressed mood: Secondary | ICD-10-CM

## 2021-10-04 DIAGNOSIS — Z888 Allergy status to other drugs, medicaments and biological substances status: Secondary | ICD-10-CM | POA: Diagnosis not present

## 2021-10-04 DIAGNOSIS — R110 Nausea: Secondary | ICD-10-CM | POA: Diagnosis not present

## 2021-10-04 DIAGNOSIS — R1031 Right lower quadrant pain: Secondary | ICD-10-CM | POA: Diagnosis not present

## 2021-10-04 DIAGNOSIS — Z79899 Other long term (current) drug therapy: Secondary | ICD-10-CM | POA: Diagnosis not present

## 2021-10-04 DIAGNOSIS — R319 Hematuria, unspecified: Secondary | ICD-10-CM | POA: Diagnosis not present

## 2021-10-04 DIAGNOSIS — Z91018 Allergy to other foods: Secondary | ICD-10-CM | POA: Diagnosis not present

## 2021-10-04 DIAGNOSIS — I10 Essential (primary) hypertension: Secondary | ICD-10-CM | POA: Diagnosis not present

## 2021-10-04 DIAGNOSIS — N132 Hydronephrosis with renal and ureteral calculous obstruction: Secondary | ICD-10-CM | POA: Diagnosis not present

## 2021-10-04 DIAGNOSIS — Z87442 Personal history of urinary calculi: Secondary | ICD-10-CM | POA: Diagnosis not present

## 2021-10-05 DIAGNOSIS — B9620 Unspecified Escherichia coli [E. coli] as the cause of diseases classified elsewhere: Secondary | ICD-10-CM | POA: Diagnosis not present

## 2021-10-05 DIAGNOSIS — R5383 Other fatigue: Secondary | ICD-10-CM | POA: Diagnosis not present

## 2021-10-05 DIAGNOSIS — A4151 Sepsis due to Escherichia coli [E. coli]: Secondary | ICD-10-CM | POA: Diagnosis not present

## 2021-10-05 DIAGNOSIS — A419 Sepsis, unspecified organism: Secondary | ICD-10-CM | POA: Diagnosis not present

## 2021-10-05 DIAGNOSIS — I248 Other forms of acute ischemic heart disease: Secondary | ICD-10-CM | POA: Diagnosis not present

## 2021-10-05 DIAGNOSIS — N839 Noninflammatory disorder of ovary, fallopian tube and broad ligament, unspecified: Secondary | ICD-10-CM | POA: Diagnosis not present

## 2021-10-05 DIAGNOSIS — I10 Essential (primary) hypertension: Secondary | ICD-10-CM | POA: Diagnosis not present

## 2021-10-05 DIAGNOSIS — E872 Acidosis, unspecified: Secondary | ICD-10-CM | POA: Diagnosis not present

## 2021-10-05 DIAGNOSIS — R6521 Severe sepsis with septic shock: Secondary | ICD-10-CM | POA: Diagnosis not present

## 2021-10-05 DIAGNOSIS — K760 Fatty (change of) liver, not elsewhere classified: Secondary | ICD-10-CM | POA: Diagnosis not present

## 2021-10-05 DIAGNOSIS — N200 Calculus of kidney: Secondary | ICD-10-CM | POA: Diagnosis not present

## 2021-10-05 DIAGNOSIS — N179 Acute kidney failure, unspecified: Secondary | ICD-10-CM | POA: Diagnosis not present

## 2021-10-05 DIAGNOSIS — Z20822 Contact with and (suspected) exposure to covid-19: Secondary | ICD-10-CM | POA: Diagnosis not present

## 2021-10-05 DIAGNOSIS — N171 Acute kidney failure with acute cortical necrosis: Secondary | ICD-10-CM | POA: Diagnosis not present

## 2021-10-05 DIAGNOSIS — N1330 Unspecified hydronephrosis: Secondary | ICD-10-CM | POA: Diagnosis not present

## 2021-10-05 DIAGNOSIS — N139 Obstructive and reflux uropathy, unspecified: Secondary | ICD-10-CM | POA: Diagnosis not present

## 2021-10-05 DIAGNOSIS — I051 Rheumatic mitral insufficiency: Secondary | ICD-10-CM | POA: Diagnosis not present

## 2021-10-05 DIAGNOSIS — N132 Hydronephrosis with renal and ureteral calculous obstruction: Secondary | ICD-10-CM | POA: Diagnosis not present

## 2021-10-05 DIAGNOSIS — R160 Hepatomegaly, not elsewhere classified: Secondary | ICD-10-CM | POA: Diagnosis not present

## 2021-10-05 DIAGNOSIS — R531 Weakness: Secondary | ICD-10-CM | POA: Diagnosis not present

## 2021-10-05 DIAGNOSIS — Z6841 Body Mass Index (BMI) 40.0 and over, adult: Secondary | ICD-10-CM | POA: Diagnosis not present

## 2021-10-05 DIAGNOSIS — N10 Acute tubulo-interstitial nephritis: Secondary | ICD-10-CM | POA: Diagnosis not present

## 2021-10-08 ENCOUNTER — Telehealth: Payer: Self-pay | Admitting: *Deleted

## 2021-10-08 NOTE — Telephone Encounter (Signed)
Melanie an ICU nurse called at the request of the patient asking that either me,or Dr. Madilyn Fireman call her.  Attempted to call pt unable to lvm due to vm being full.

## 2021-10-10 NOTE — Telephone Encounter (Signed)
Her son dropped off paperwork, Lenice Pressman so that he could be with her while she is in the hospital and provide transportation and services.  Form completed from [facility] for disability claim that was dropped off.  Placed in E[place] B basket on 10/10/2021.

## 2021-10-10 NOTE — Telephone Encounter (Signed)
Patient was called and notified of sons paperwork being done and ready for pickup.

## 2021-10-14 ENCOUNTER — Ambulatory Visit: Payer: BC Managed Care – PPO | Admitting: Family Medicine

## 2021-10-14 ENCOUNTER — Encounter: Payer: Self-pay | Admitting: Family Medicine

## 2021-10-14 VITALS — BP 152/77 | HR 78 | Temp 99.1°F | Ht 62.0 in | Wt 234.0 lb

## 2021-10-14 DIAGNOSIS — I10 Essential (primary) hypertension: Secondary | ICD-10-CM

## 2021-10-14 DIAGNOSIS — N12 Tubulo-interstitial nephritis, not specified as acute or chronic: Secondary | ICD-10-CM

## 2021-10-14 DIAGNOSIS — I96 Gangrene, not elsewhere classified: Secondary | ICD-10-CM | POA: Diagnosis not present

## 2021-10-14 DIAGNOSIS — R509 Fever, unspecified: Secondary | ICD-10-CM

## 2021-10-14 MED ORDER — SULFAMETHOXAZOLE-TRIMETHOPRIM 800-160 MG PO TABS: 1.0000 | ORAL_TABLET | Freq: Two times a day (BID) | ORAL | 0 refills | Status: DC

## 2021-10-14 MED ORDER — CEFTRIAXONE SODIUM 1 G IJ SOLR
1.0000 g | Freq: Once | INTRAMUSCULAR | Status: AC
  Administered 2021-10-14: 1 g via INTRAMUSCULAR

## 2021-10-14 NOTE — Assessment & Plan Note (Signed)
Pressure not well controlled on the amlodipine 5 mg.  I need to recheck her renal function.  If it is back down to normal then we might be able to put her back on the lisinopril HCT.  If its not back to normal then we can increase her amlodipine to 10 mg and send over new prescription if needed.

## 2021-10-14 NOTE — Progress Notes (Signed)
Acute Office Visit  Subjective:     Patient ID: Kristen Spence, female    DOB: 10/25/1960, 61 y.o.   MRN: [id number]  Chief Complaint  Patient presents with   Hospitalization Follow-up    HPI Patient is in today for fever.  She was admitted to the hospital on September 17 and discharged home on the 24th 2 days ago.  Admitted for 6 days total.  She was diagnosed with sepsis due to E. coli with acute renal failure with renal cortical necrosis and septic shock.  Coli was felt to be secondary to acute pyelonephritis.  She presented initially with complaint of acute flank pain with dysuria frequency and urgency she was found to have a small 4 mm right ureteral stone with moderate hydronephrosis.  She then returned home but continued to have worsening pain and then fever.  So she returned back to the emergency department the following day follow-up CT showed passage of the stone and improvement in the degree of hydronephrosis but was noted to have elevated cardiac enzymes and so was worked up further.  She was then diagnosed with sepsis due to E. coli with acute renal failure.  Given the uric acid component of the stones they recommended alkalinizing the patient's urine and was started on bicarb infusion while in the ICU.  She was later transitioned to oral bicarbonate.  Was encouraged to follow-up in about 2 weeks with urology.  She was sent home with pain management with oxycodone.  She started running a fever to 101 last night.  She says she was told she was going to be discharged with antibiotics and thought she was taking 1 until she really looked at her list and her medications and did not see one on there.  They did switch out her blood pressure pill from lisinopril HCT to amlodipine because of renal function.  Blood pressures have been running in the 140s at home she had a headache for about a week as well as some sneezing and nasal congestion.  ROS        Objective:    BP (!) 152/77    Pulse 78   Temp 99.1 F (37.3 C)   Ht 5\' 2"  (1.575 m)   Wt 234 lb (106.1 kg)   SpO2 98%   BMI 42.80 kg/m    Physical Exam  No results found for any visits on 10/14/21.      Assessment & Plan:   Problem List Items Addressed This Visit       Cardiovascular and Mediastinum   HYPERTENSION, BENIGN - Primary    Pressure not well controlled on the amlodipine 5 mg.  I need to recheck her renal function.  If it is back down to normal then we might be able to put her back on the lisinopril HCT.  If its not back to normal then we can increase her amlodipine to 10 mg and send over new prescription if needed.      Relevant Medications   amLODipine (NORVASC) 5 MG tablet   [facility] ASPIRIN ADULT LOW DOSE 81 MG chewable tablet   rosuvastatin (CRESTOR) 10 MG tablet   Other Relevant Orders   CBC with Differential/Platelet   Urinalysis, Routine w reflex microscopic   Urine Culture   COMPLETE METABOLIC PANEL WITH GFR     Musculoskeletal and Integument   Skin necrosis (HCC)   Other Visit Diagnoses     Fever, unspecified fever cause  Relevant Medications   cefTRIAXone (ROCEPHIN) injection 1 g (Completed)   Other Relevant Orders   CBC with Differential/Platelet   Urinalysis, Routine w reflex microscopic   Urine Culture   COMPLETE METABOLIC PANEL WITH GFR   Pyelonephritis       Relevant Medications   sulfamethoxazole-trimethoprim (BACTRIM DS) 800-160 MG tablet   cefTRIAXone (ROCEPHIN) injection 1 g (Completed)   Other Relevant Orders   CBC with Differential/Platelet   Urinalysis, Routine w reflex microscopic   Urine Culture   COMPLETE METABOLIC PANEL WITH GFR       Recent pyelonephritis causing sepsis-she had significant necrosis on her lips today encouraged her to make sure that she is moisturizing hydrating those well.  We discussed giving ceftriaxone today since she did run a fever last night.  She has been having some sinus symptoms as well.  I think we should cover her for  the pyelonephritis and the sinus symptoms.  Bactrim sent to pharmacy.  It looks like the initial urine culture that was obtained was sensitive to Bactrim.  There were blood cultures also positive for E. coli but I was unable to find the sensitivities through care everywhere.  If she is not improving pretty quickly encouraged her to call back or reach back out.  We can always have her come back in for second Rocephin injection if needed.  Did fill out FMLA forms for her son to have leave for 2 weeks.  If he needs intermittent leave after that then he will let us know.  Skin necrosis-just really encouraged her to moisturize the area on her lips that are affected as well as the nasal sores in her nose with a moisturizer such as Vaseline.  Meds ordered this encounter  Medications   sulfamethoxazole-trimethoprim (BACTRIM DS) 800-160 MG tablet    Sig: Take 1 tablet by mouth 2 (two) times daily.    Dispense:  14 tablet    Refill:  0   cefTRIAXone (ROCEPHIN) injection 1 g    Order Specific Question:   Antibiotic Indication:    Answer:   Other Indication (list below)    Return in about 2 weeks (around 10/28/2021) for recheck on sepsis.  I spent 45 minutes on the day of the encounter to include pre-visit record review, face-to-face time with the patient and post visit ordering of test.   Beatrice Lecher, MD

## 2021-10-15 ENCOUNTER — Ambulatory Visit: Payer: BC Managed Care – PPO | Admitting: Professional

## 2021-10-15 NOTE — Progress Notes (Signed)
Hi Kristen Spence, your white blood cell count looks good so that is actually really reassuring.  Actually you are able to pick up the antibiotic and get started on it.  But keep a close eye and if in the next couple of days you are not feeling better or if you are still running a fever then please let us know.

## 2021-10-16 LAB — URINE CULTURE
MICRO NUMBER:: 13975106
Result:: NO GROWTH
SPECIMEN QUALITY:: ADEQUATE

## 2021-10-16 LAB — CBC WITH DIFFERENTIAL/PLATELET
Absolute Monocytes: 446 cells/uL (ref 200–950)
Basophils Absolute: 28 cells/uL (ref 0–200)
Basophils Relative: 0.3 %
Eosinophils Absolute: 93 cells/uL (ref 15–500)
Eosinophils Relative: 1 %
HCT: 37.1 % (ref 35.0–45.0)
Hemoglobin: 12.4 g/dL (ref 11.7–15.5)
Lymphs Abs: 1367 cells/uL (ref 850–3900)
MCH: 30.3 pg (ref 27.0–33.0)
MCHC: 33.4 g/dL (ref 32.0–36.0)
MCV: 90.7 fL (ref 80.0–100.0)
MPV: 10.3 fL (ref 7.5–12.5)
Monocytes Relative: 4.8 %
Neutro Abs: 7366 cells/uL (ref 1500–7800)
Neutrophils Relative %: 79.2 %
Platelets: 411 10*3/uL — ABNORMAL HIGH (ref 140–400)
RBC: 4.09 10*6/uL (ref 3.80–5.10)
RDW: 13.1 % (ref 11.0–15.0)
Total Lymphocyte: 14.7 %
WBC: 9.3 10*3/uL (ref 3.8–10.8)

## 2021-10-16 LAB — COMPLETE METABOLIC PANEL WITH GFR
AG Ratio: 1.1 (calc) (ref 1.0–2.5)
ALT: 25 U/L (ref 6–29)
AST: 30 U/L (ref 10–35)
Albumin: 3.9 g/dL (ref 3.6–5.1)
Alkaline phosphatase (APISO): 149 U/L (ref 37–153)
BUN: 12 mg/dL (ref 7–25)
CO2: 24 mmol/L (ref 20–32)
Calcium: 9.2 mg/dL (ref 8.6–10.4)
Chloride: 103 mmol/L (ref 98–110)
Creat: 0.85 mg/dL (ref 0.50–1.03)
Globulin: 3.4 g/dL (calc) (ref 1.9–3.7)
Glucose, Bld: 79 mg/dL (ref 65–99)
Potassium: 3.8 mmol/L (ref 3.5–5.3)
Sodium: 140 mmol/L (ref 135–146)
Total Bilirubin: 0.6 mg/dL (ref 0.2–1.2)
Total Protein: 7.3 g/dL (ref 6.1–8.1)
eGFR: 79 mL/min/{1.73_m2} (ref >=60)

## 2021-10-16 LAB — URINALYSIS, ROUTINE W REFLEX MICROSCOPIC
Bacteria, UA: NONE SEEN /HPF
Bilirubin Urine: NEGATIVE
Glucose, UA: NEGATIVE
Hyaline Cast: NONE SEEN /LPF
Ketones, ur: NEGATIVE
Nitrite: NEGATIVE
Specific Gravity, Urine: 1.019 (ref 1.001–1.035)
pH: 7.5 (ref 5.0–8.0)

## 2021-10-16 LAB — MICROSCOPIC MESSAGE

## 2021-10-16 NOTE — Progress Notes (Signed)
Hi Kristen Spence, great news!  Your urine culture is negative.  So it is possible the fever was coming from your sinuses.  I hope you are feeling a lot better but if not please let us know.

## 2021-10-17 ENCOUNTER — Other Ambulatory Visit: Payer: Self-pay | Admitting: Family Medicine

## 2021-10-17 ENCOUNTER — Telehealth: Payer: Self-pay | Admitting: Family Medicine

## 2021-10-17 MED ORDER — NYSTATIN 100000 UNIT/ML MT SUSP: 5.0000 mL | Freq: Four times a day (QID) | OROMUCOSAL | 0 refills | Status: DC

## 2021-10-17 NOTE — Progress Notes (Signed)
Sent over nystatin swish and swallow

## 2021-10-17 NOTE — Telephone Encounter (Signed)
Pt dropped off her signed FMLA papers to be filled out by PCP and I also noted on there her upcoming appt with Heart Dr and another Dr. Please Fax to pts work at [employer] number on form.  I gave forms to [place] and I also made sure patient was scheduled for her 2 week F/u on Sepsis with Dr.Metheney

## 2021-10-17 NOTE — Progress Notes (Signed)
I sent over nystatin. I don't have the magic mouthwash recipe with me

## 2021-10-21 NOTE — Telephone Encounter (Signed)
I have most of her paperwork completed but need to know when she plans to return.  In 2 weeks?

## 2021-10-21 NOTE — Telephone Encounter (Signed)
Kristen Spence states she has to follow up with Urology in 2 weeks and the Cardiologist in 3 weeks. She states she can not return until after the appointments. She is still getting short of breath with activity.

## 2021-10-23 ENCOUNTER — Encounter: Payer: Self-pay | Admitting: Physician Assistant

## 2021-10-23 ENCOUNTER — Ambulatory Visit (INDEPENDENT_AMBULATORY_CARE_PROVIDER_SITE_OTHER): Payer: BC Managed Care – PPO | Admitting: Physician Assistant

## 2021-10-23 VITALS — BP 124/75 | HR 105 | Ht 62.0 in | Wt 226.0 lb

## 2021-10-23 DIAGNOSIS — N179 Acute kidney failure, unspecified: Secondary | ICD-10-CM

## 2021-10-23 DIAGNOSIS — N12 Tubulo-interstitial nephritis, not specified as acute or chronic: Secondary | ICD-10-CM

## 2021-10-23 DIAGNOSIS — A4151 Sepsis due to Escherichia coli [E. coli]: Secondary | ICD-10-CM

## 2021-10-23 DIAGNOSIS — Z87828 Personal history of other (healed) physical injury and trauma: Secondary | ICD-10-CM | POA: Insufficient documentation

## 2021-10-23 DIAGNOSIS — R6521 Severe sepsis with septic shock: Secondary | ICD-10-CM

## 2021-10-23 DIAGNOSIS — L509 Urticaria, unspecified: Secondary | ICD-10-CM | POA: Diagnosis not present

## 2021-10-23 DIAGNOSIS — N171 Acute kidney failure with acute cortical necrosis: Secondary | ICD-10-CM

## 2021-10-23 MED ORDER — METHYLPREDNISOLONE SODIUM SUCC 125 MG IJ SOLR
125.0000 mg | Freq: Once | INTRAMUSCULAR | Status: AC
  Administered 2021-10-23: 125 mg via INTRAMUSCULAR

## 2021-10-23 MED ORDER — PREDNISONE 50 MG PO TABS: ORAL_TABLET | ORAL | 0 refills | Status: DC

## 2021-10-23 MED ORDER — HYDROXYZINE PAMOATE 25 MG PO CAPS: 25.0000 mg | ORAL_CAPSULE | Freq: Three times a day (TID) | ORAL | 0 refills | Status: DC | PRN

## 2021-10-23 NOTE — Progress Notes (Signed)
Acute Office Visit  Subjective:     Patient ID: Kristen Spence, female    DOB: 07-27-1960, 61 y.o.   MRN: [id number]  Chief Complaint  Patient presents with   Urticaria    HPI Patient is in today for hospital follow up and hives that started last night.   Pt was admitted into hospital on 9/17 and discharged on 9/24 for sepsis due to E.coli with acute renal failure and septic shock due to right kidney stone and pyleonephritis.   Hospital Course:  Sepsis due to Escherichia coli with acute renal failure, renal cortical necrosis, and septic shock (*) with Acute pyelonephritis with RIGHT urolithiasis with hydroureter and hydronephrosis with spontaneous passage of ureteral stone PTA 10/05/21, Lactic acidosis Presented with sepsis and septic shock with urinary outflow obstruction and ARF with stone, spontaneously passed from 9/16-9/17, noting subsequent improvement in renal indices, and hydronephrosis, while requiring vasopressor support and intensivist managing her critical illness. She was able to be weaned off of pressors, all blood cultures and urine culture are all demonstrating growth of Escherichia coli which is fairly susceptible. She was de-escalated from cefepime to ceftriaxone and is currently on day #8 of planned 2-week course of therapy. Urologic consultation was obtained, and recommended given the uric acid component of the ureterolithiasis, alkalinizing the patient's urine, she was initially on bicarb infusion while in the ICU, and this was later transitioned to oral bicarbonate. She seemed to be doing well upon transfer to the floor, however she on 9/22-9/23 was developing recurrent right flank pain, prompting repeat CT imaging. This demonstrated passage of previous renal pelvic stones into the ureter and there was noted at the distal right ureter with moderate degree of hydroureteronephrosis, but felt given the small size of the stones at most 4 mm of the to visualize stones, this  would likely pass with continued Flomax & sodium bicarb. Overnight 9/23 - 9/24 patient reported again recurrence of flank pain, and passage of stones in urine. Since that time she has been markedly pain-free, and has been quite improved. She is going to be transition to oral therapy with high-dose cefadroxil to complete her remaining 2-week course of therapy at this time, along with continuation of Flomax 0.4 mg daily, sodium bicarb 650 mg p.o. 3 times daily for at least the next 2 weeks, and patient is to follow-up with urology in the next 2 weeks for ongoing management. We will send her as well with p.o. oxycodone for severe pain if needed.    NEW medications  Details  amLODIPine besylate (NORVASC) 5 mg tablet Take one tablet (5 mg dose) by mouth daily. Start date: 10/13/2021   aspirin 81 mg chewable tablet Chew one tablet (81 mg dose) by mouth daily. Start date: 10/13/2021, End date: 10/13/2022   lidocaine (XYLOCAINE) 2% solution Take 5 mLs by mouth as needed for Pain for up to 10 days. Start date: 10/12/2021, End date: 10/22/2021   rosuvastatin calcium (CRESTOR) 10 mg tablet Take one tablet (10 mg dose) by mouth at bedtime. Start date: 10/12/2021   sodium bicarbonate 650 mg tablet Take one tablet (650 mg dose) by mouth 3 (three) times a day. Start date: 10/12/2021, End date: 10/12/2022   valACYclovir (VALTREX) 1000 mg tablet Take one tablet (1,000 mg dose) by mouth 2 (two) times daily for 6 days. Start date: 10/12/2021, End date: 10/18/2021    CHANGED medications  Details  ondansetron (ZOFRAN-ODT) 4 mg disintegrating tablet Take one tablet (4 mg dose) by mouth  every 6 (six) hours for 7 days. Start date: 10/12/2021, End date: 10/19/2021   oxyCODONE HCl (ROXICODONE) 5 mg immediate release tablet Take one tablet (5 mg dose) by mouth every 6 (six) hours as needed for Pain for up to 5 days. Max Daily Amount: 20 mg Start date: 10/12/2021, End date: 10/17/2021   tamsulosin (FLOMAX) 0.4 mg CAPS Take  one capsule (0.4 mg dose) by mouth daily for 30 days. Start date: 10/12/2021, End date: 11/11/2021    Last night she started itching and broke out in rash. She had just taken her last bactrim. She denies any SOB, tongue swelling, cough, problems swallowing. No fever, chills, body aches.    ROS  See HPI.     Objective:    BP 124/75   Pulse (!) 105   Ht 5\' 2"  (1.575 m)   Wt 226 lb (102.5 kg)   SpO2 98%   BMI 41.34 kg/m  BP Readings from Last 3 Encounters:  10/23/21 124/75  10/14/21 (!) 152/77  09/01/21 117/60   Wt Readings from Last 3 Encounters:  10/23/21 226 lb (102.5 kg)  10/14/21 234 lb (106.1 kg)  09/01/21 234 lb (106.1 kg)      Physical Exam Constitutional:      Appearance: Normal appearance. She is obese.  HENT:     Head: Normocephalic.  Cardiovascular:     Rate and Rhythm: Normal rate and regular rhythm.     Pulses: Normal pulses.  Pulmonary:     Effort: Pulmonary effort is normal.     Breath sounds: Normal breath sounds.  Abdominal:     Tenderness: There is no right CVA tenderness or left CVA tenderness.  Musculoskeletal:     Right lower leg: No edema.     Left lower leg: No edema.  Skin:    Comments: Scabbed lower left lip  Raised erythematous whelps over trunk, abdomen, groin.   Neurological:     General: No focal deficit present.     Mental Status: She is alert and oriented to person, place, and time.  Psychiatric:        Mood and Affect: Mood normal.           Assessment & Plan:  08/16/23Marland KitchenMerle was seen today for urticaria.  Diagnoses and all orders for this visit:  Hives -     COMPLETE METABOLIC PANEL WITH GFR -     CBC w/Diff/Platelet -     predniSONE (DELTASONE) 50 MG tablet; One tab PO daily for 5 days. -     hydrOXYzine (VISTARIL) 25 MG capsule; Take 1 capsule (25 mg total) by mouth 3 (three) times daily as needed for itching. -     methylPREDNISolone sodium succinate (SOLU-MEDROL) 125 mg/2 mL injection 125 mg  Pyelonephritis -      COMPLETE METABOLIC PANEL WITH GFR -     CBC w/Diff/Platelet  Sepsis due to Escherichia coli with acute renal failure, renal cortical necrosis, and septic shock (HCC) -     COMPLETE METABOLIC PANEL WITH GFR -     CBC w/Diff/Platelet  AKI (acute kidney injury) (HCC) -     COMPLETE METABOLIC PANEL WITH GFR -     CBC w/Diff/Platelet   Most likely cause of is bactrim drug reaction Bactrim added to allergy list Solumedrol given IM today Prednisone burst to start if hives re-appear Recheck kidney function and CBC Hydroxyzine for itching Follow up with PCP as needed or if symptoms worsen If hives reoccur then need to  consider the amlodipine or flomax that was added in the hospital  [facility], PA-C

## 2021-10-23 NOTE — Patient Instructions (Signed)
Hives Hives (urticaria) are itchy, red, swollen areas on the skin. Hives can appear on any part of the body. Hives often fade within 24 hours (acute hives). Sometimes, new hives appear after old ones fade and the cycle can continue for several days or weeks (chronic hives). Hives do not spread from person to person (are not contagious). Hives come from the body's reaction to something a person is allergic to (allergen), something that causes irritation, or various other triggers. When a person is exposed to a trigger, his or her body releases a chemical (histamine) that causes redness, itching, and swelling. Hives can appear right after exposure to a trigger or hours later. What are the causes? This condition may be caused by: Allergies to foods or ingredients. Insect bites or stings. Exposure to pollen or pets. Spending time in sunlight, heat, or cold (exposure). Exercise. Stress. You can also get hives from other medical conditions and treatments, such as: Viruses, including the common cold. Bacterial infections, such as urinary tract infections and strep throat. Certain medicines. Contact with latex or chemicals. Allergy shots. Blood transfusions. Sometimes, the cause of this condition is not known (idiopathic hives). What increases the risk? You are more likely to develop this condition if you: Are a woman. Have food allergies, especially to citrus fruits, milk, eggs, peanuts, tree nuts, or shellfish. Are allergic to: Medicines. Latex. Insects. Animals. Pollen. What are the signs or symptoms? Common symptoms of this condition include raised, itchy, red or white bumps or patches on your skin. These areas may: Become large and swollen (welts). Change in shape and location, quickly and repeatedly. Be separate hives or connect over a large area of skin. Sting or become painful. Turn white when pressed in the center (blanch). In severe cases, your hands, feet, and face may also  become swollen. This may occur if hives develop deeper in your skin. How is this diagnosed? This condition may be diagnosed by your symptoms, medical history, and physical exam. Your skin, urine, or blood may be tested to find out what is causing your hives and to rule out other health issues. Your health care provider may also remove a small sample of skin from the affected area and examine it under a microscope (biopsy). How is this treated? Treatment for this condition depends on the cause and severity of your symptoms. Your health care provider may recommend using cool, wet cloths (cool compresses) or taking cool showers to relieve itching. Treatment may include: Medicines that help: Relieve itching (antihistamines). Reduce swelling (corticosteroids). Treat infection (antibiotics). An injectable medicine (omalizumab). Your health care provider may prescribe this if you have chronic idiopathic hives and you continue to have symptoms even after treatment with antihistamines. Severe cases may require an emergency injection of adrenaline (epinephrine) to prevent a life-threatening allergic reaction (anaphylaxis). Follow these instructions at home: Medicines Take and apply over-the-counter and prescription medicines only as told by your health care provider. If you were prescribed an antibiotic medicine, take it as told by your health care provider. Do not stop using the antibiotic even if you start to feel better. Skin care Apply cool compresses to the affected areas. Do not scratch or rub your skin. General instructions Do not take hot showers or baths. This can make itching worse. Do not wear tight-fitting clothing. Use sunscreen and wear protective clothing when you are outside. Avoid any substances that cause your hives. Keep a journal to help track what causes your hives. Write down: What medicines you take.   What you eat and drink. What products you use on your skin. Keep all  follow-up visits as told by your health care provider. This is important. Contact a health care provider if: Your symptoms are not controlled with medicine. Your joints are painful or swollen. Get help right away if: You have a fever. You have pain in your abdomen. Your tongue or lips are swollen. Your eyelids are swollen. Your chest or throat feels tight. You have trouble breathing or swallowing. These symptoms may represent a serious problem that is an emergency. Do not wait to see if the symptoms will go away. Get medical help right away. Call your local emergency services (911 in the [place] Do not drive yourself to the hospital. Summary Hives (urticaria) are itchy, red, swollen areas on your skin. Hives come from the body's reaction to something a person is allergic to (allergen), something that causes irritation, or various other triggers. Treatment for this condition depends on the cause and severity of your symptoms. Avoid any substances that cause your hives. Keep a journal to help track what causes your hives. Take and apply over-the-counter and prescription medicines only as told by your health care provider. Get help right away if your chest or throat feels tight or if you have trouble breathing or swallowing. This information is not intended to replace advice given to you by your health care provider. Make sure you discuss any questions you have with your health care provider. Document Revised: 05/21/2021 Document Reviewed: 02/25/2020 Elsevier Patient Education  2023 [employer] Inc.  

## 2021-10-24 LAB — COMPLETE METABOLIC PANEL WITH GFR
AG Ratio: 1.3 (calc) (ref 1.0–2.5)
ALT: 36 U/L — ABNORMAL HIGH (ref 6–29)
AST: 27 U/L (ref 10–35)
Albumin: 4.4 g/dL (ref 3.6–5.1)
Alkaline phosphatase (APISO): 90 U/L (ref 37–153)
BUN/Creatinine Ratio: 14 (calc) (ref 6–22)
BUN: 16 mg/dL (ref 7–25)
CO2: 21 mmol/L (ref 20–32)
Calcium: 10.2 mg/dL (ref 8.6–10.4)
Chloride: 107 mmol/L (ref 98–110)
Creat: 1.16 mg/dL — ABNORMAL HIGH (ref 0.50–1.03)
Globulin: 3.3 g/dL (calc) (ref 1.9–3.7)
Glucose, Bld: 141 mg/dL — ABNORMAL HIGH (ref 65–99)
Potassium: 4.2 mmol/L (ref 3.5–5.3)
Sodium: 138 mmol/L (ref 135–146)
Total Bilirubin: 0.5 mg/dL (ref 0.2–1.2)
Total Protein: 7.7 g/dL (ref 6.1–8.1)
eGFR: 54 mL/min/{1.73_m2} — ABNORMAL LOW (ref >=60)

## 2021-10-24 LAB — CBC WITH DIFFERENTIAL/PLATELET
Absolute Monocytes: 413 cells/uL (ref 200–950)
Basophils Absolute: 10 cells/uL (ref 0–200)
Basophils Relative: 0.1 %
Eosinophils Absolute: 58 cells/uL (ref 15–500)
Eosinophils Relative: 0.6 %
HCT: 39.9 % (ref 35.0–45.0)
Hemoglobin: 13.5 g/dL (ref 11.7–15.5)
Lymphs Abs: 1680 cells/uL (ref 850–3900)
MCH: 30.8 pg (ref 27.0–33.0)
MCHC: 33.8 g/dL (ref 32.0–36.0)
MCV: 90.9 fL (ref 80.0–100.0)
MPV: 9.5 fL (ref 7.5–12.5)
Monocytes Relative: 4.3 %
Neutro Abs: 7440 cells/uL (ref 1500–7800)
Neutrophils Relative %: 77.5 %
Platelets: 673 10*3/uL — ABNORMAL HIGH (ref 140–400)
RBC: 4.39 10*6/uL (ref 3.80–5.10)
RDW: 13.5 % (ref 11.0–15.0)
Total Lymphocyte: 17.5 %
WBC: 9.6 10*3/uL (ref 3.8–10.8)

## 2021-10-24 NOTE — Progress Notes (Signed)
Normal WBC which is reassuring for infection cleared.  Your kidney function dropped some again. Push fluids. Avoid any NSAIDS. Repeat kidney function on Monday.

## 2021-10-28 ENCOUNTER — Telehealth: Payer: Self-pay | Admitting: Neurology

## 2021-10-28 ENCOUNTER — Ambulatory Visit (INDEPENDENT_AMBULATORY_CARE_PROVIDER_SITE_OTHER): Payer: BC Managed Care – PPO | Admitting: Family Medicine

## 2021-10-28 VITALS — BP 130/74 | HR 77 | Ht 62.0 in | Wt 225.0 lb

## 2021-10-28 DIAGNOSIS — D509 Iron deficiency anemia, unspecified: Secondary | ICD-10-CM | POA: Diagnosis not present

## 2021-10-28 DIAGNOSIS — A4151 Sepsis due to Escherichia coli [E. coli]: Secondary | ICD-10-CM

## 2021-10-28 DIAGNOSIS — R6521 Severe sepsis with septic shock: Secondary | ICD-10-CM | POA: Diagnosis not present

## 2021-10-28 DIAGNOSIS — N171 Acute kidney failure with acute cortical necrosis: Secondary | ICD-10-CM

## 2021-10-28 DIAGNOSIS — I10 Essential (primary) hypertension: Secondary | ICD-10-CM

## 2021-10-28 MED ORDER — AMLODIPINE BESYLATE 5 MG PO TABS: 5.0000 mg | ORAL_TABLET | Freq: Every day | ORAL | 3 refills | Status: DC

## 2021-10-28 NOTE — Telephone Encounter (Signed)
[place] forms along with note from Dr. Madilyn Fireman faxed to [phone] with confirmation received.

## 2021-10-28 NOTE — Telephone Encounter (Signed)
Were completed.  Faxed today on 1010.  Also sent a letter stating for why there was a delay in returning her form since I was out sick personally.

## 2021-10-28 NOTE — Progress Notes (Unsigned)
   Established Patient Office Visit  Subjective   Patient ID: Kristen Spence, female    DOB: 1960/04/10  Age: 61 y.o. MRN: [id number]  Chief Complaint  Patient presents with   Hospitalization Follow-up    Sepsis dx in hospital - still weak and fatigued - was told by kidney specialist to drink 128oz of lemon water per day - she will see the specialist tomorrow  at f/u appt and will discuss with him as she is having difficulty drinking this amount.    HPI  {History (Optional):23778}  ROS    Objective:     BP 130/74   Pulse 77   Ht 5\' 2"  (1.575 m)   Wt 225 lb 0.6 oz (102.1 kg)   SpO2 98%   BMI 41.16 kg/m  {Vitals History (Optional):23777}  Physical Exam Vitals and nursing note reviewed.  Constitutional:      Appearance: She is well-developed.  HENT:     Head: Normocephalic and atraumatic.  Cardiovascular:     Rate and Rhythm: Normal rate and regular rhythm.     Heart sounds: Normal heart sounds.  Pulmonary:     Effort: Pulmonary effort is normal.     Breath sounds: Normal breath sounds.  Skin:    General: Skin is warm and dry.  Neurological:     Mental Status: She is alert and oriented to person, place, and time.  Psychiatric:        Behavior: Behavior normal.      No results found for any visits on 10/28/21.  {Labs (Optional):23779}  The ASCVD Risk score (Arnett DK, et al., 2019) failed to calculate for the following reasons:   Unable to determine if patient is Non-Hispanic African American    Assessment & Plan:   Problem List Items Addressed This Visit       Cardiovascular and Mediastinum   HYPERTENSION, BENIGN - Primary   Relevant Medications   amLODipine (NORVASC) 5 MG tablet     Genitourinary   Sepsis due to Escherichia coli with acute renal failure, renal cortical necrosis, and septic shock (HCC)    No follow-ups on file.    Beatrice Lecher, MD

## 2021-10-29 DIAGNOSIS — I10 Essential (primary) hypertension: Secondary | ICD-10-CM | POA: Diagnosis not present

## 2021-10-29 DIAGNOSIS — N1330 Unspecified hydronephrosis: Secondary | ICD-10-CM | POA: Diagnosis not present

## 2021-10-29 DIAGNOSIS — N200 Calculus of kidney: Secondary | ICD-10-CM | POA: Diagnosis not present

## 2021-10-29 NOTE — Assessment & Plan Note (Signed)
Pressure overall looks pretty good today.  She has lost a fair amount of weight since being quite ill.  Her goal is to try to maintain her current weight.

## 2021-10-29 NOTE — Assessment & Plan Note (Signed)
She is getting much better overall still weak and exhausted.  And still not able to drive.  Again forms were completed for work I will see her back at the end of months just to make sure that she is safe to return to full duty.  She has follow-up with urology tomorrow to address the kidney stones.  Continue pushing lots of fluids.

## 2021-10-29 NOTE — Assessment & Plan Note (Signed)
Plan to recheck iron levels at the end of the month.

## 2021-11-04 DIAGNOSIS — N200 Calculus of kidney: Secondary | ICD-10-CM | POA: Diagnosis not present

## 2021-11-04 DIAGNOSIS — N1330 Unspecified hydronephrosis: Secondary | ICD-10-CM | POA: Diagnosis not present

## 2021-11-04 DIAGNOSIS — N2889 Other specified disorders of kidney and ureter: Secondary | ICD-10-CM | POA: Diagnosis not present

## 2021-11-06 ENCOUNTER — Other Ambulatory Visit: Payer: Self-pay | Admitting: Family Medicine

## 2021-11-13 ENCOUNTER — Encounter: Payer: Self-pay | Admitting: Family Medicine

## 2021-11-13 ENCOUNTER — Ambulatory Visit (INDEPENDENT_AMBULATORY_CARE_PROVIDER_SITE_OTHER): Payer: BC Managed Care – PPO | Admitting: Family Medicine

## 2021-11-13 VITALS — BP 135/72 | HR 89 | Ht 62.0 in | Wt 224.0 lb

## 2021-11-13 DIAGNOSIS — D509 Iron deficiency anemia, unspecified: Secondary | ICD-10-CM | POA: Diagnosis not present

## 2021-11-13 DIAGNOSIS — I2489 Other forms of acute ischemic heart disease: Secondary | ICD-10-CM | POA: Diagnosis not present

## 2021-11-13 DIAGNOSIS — N171 Acute kidney failure with acute cortical necrosis: Secondary | ICD-10-CM | POA: Diagnosis not present

## 2021-11-13 DIAGNOSIS — A4151 Sepsis due to Escherichia coli [E. coli]: Secondary | ICD-10-CM

## 2021-11-13 DIAGNOSIS — N179 Acute kidney failure, unspecified: Secondary | ICD-10-CM | POA: Diagnosis not present

## 2021-11-13 DIAGNOSIS — R6521 Severe sepsis with septic shock: Secondary | ICD-10-CM

## 2021-11-13 DIAGNOSIS — B372 Candidiasis of skin and nail: Secondary | ICD-10-CM

## 2021-11-13 DIAGNOSIS — I10 Essential (primary) hypertension: Secondary | ICD-10-CM | POA: Diagnosis not present

## 2021-11-13 DIAGNOSIS — G43109 Migraine with aura, not intractable, without status migrainosus: Secondary | ICD-10-CM

## 2021-11-13 DIAGNOSIS — F4321 Adjustment disorder with depressed mood: Secondary | ICD-10-CM

## 2021-11-13 MED ORDER — ALPRAZOLAM 0.25 MG PO TABS: 0.2500 mg | ORAL_TABLET | Freq: Every day | ORAL | 0 refills | Status: DC | PRN

## 2021-11-13 MED ORDER — NYSTATIN 100000 UNIT/GM EX POWD: 1.0000 | Freq: Two times a day (BID) | CUTANEOUS | 1 refills | Status: DC

## 2021-11-13 MED ORDER — AMLODIPINE BESYLATE 10 MG PO TABS: 5.0000 mg | ORAL_TABLET | Freq: Every day | ORAL | 1 refills | Status: DC

## 2021-11-13 NOTE — Progress Notes (Signed)
Established Patient Office Visit  Subjective   Patient ID: Kristen Spence, female    DOB: 30-Oct-1960  Age: 61 y.o. MRN: [id number]  Chief Complaint  Patient presents with   Follow-up         HPI  Here today for follow-up.  I saw her last time for a posthospitalization follow-up since then she has consulted with urology for her uric acid stones.  They have recommended adding lemon juice to 2 L of water daily to drink.  And given her some nutritional information to reduce the risk for developing new uric acid stones.  Dr. Daine Floras had also ordered a renal ultrasound to follow-up on right hydronephrosis.  Iron deficiency-plan to recheck iron levels.  She reports that she still really struggling with sleep so she is been taking half a tab of the alprazolam as needed if she is not having to use it as often as she was before.  She will usually wait at least an hour if she cannot sleep before she takes it.  She still noticing quite elevated blood pressures at home.  She is currently taking 5 mg of amlodipine.  She is feels like is not controlling her blood pressure as well as her prior medication that was stopped when she had acute kidney injury.   Would also like a refill on the nystatin powder.  He notices that she gets nauseated when she gets really tired.    ROS    Objective:     BP 135/72   Pulse 89   Ht 5\' 2"  (1.575 m)   Wt 224 lb (101.6 kg)   SpO2 99%   BMI 40.97 kg/m    Physical Exam Vitals and nursing note reviewed.  Constitutional:      Appearance: She is well-developed.  HENT:     Head: Normocephalic and atraumatic.  Cardiovascular:     Rate and Rhythm: Normal rate and regular rhythm.     Heart sounds: Normal heart sounds.  Pulmonary:     Effort: Pulmonary effort is normal.     Breath sounds: Normal breath sounds.  Skin:    General: Skin is warm and dry.  Neurological:     Mental Status: She is alert and oriented to person, place, and time.  Psychiatric:         Behavior: Behavior normal.      No results found for any visits on 11/13/21.    The ASCVD Risk score (Arnett DK, et al., 2019) failed to calculate for the following reasons:   Unable to determine if patient is Non-Hispanic African American    Assessment & Plan:   Problem List Items Addressed This Visit       Cardiovascular and Mediastinum   Migraine headache    She has been having more frequent headaches some of which are just more tension type headaches since being hospitalized.  Not sure if it is related to her blood pressure not being maximally controlled or other factors.  She still feels weak and tired but is trying to push herself to increase her stamina and energy.      Relevant Medications   amLODipine (NORVASC) 10 MG tablet   HYPERTENSION, BENIGN    Not at goal.  Increase amlodipine to 10 mg daily.      Relevant Medications   amLODipine (NORVASC) 10 MG tablet     Genitourinary   Sepsis due to Escherichia coli with acute renal failure, renal cortical necrosis, and septic shock (  [place])    This point she is still recovering still struggling with fatigue's and frequent headaches and were still try to get her blood pressure under control.  We will extend work leave until December 1.  She will need to get his new and updated paperwork.      Relevant Medications   nystatin (MYCOSTATIN/NYSTOP) powder   Other Relevant Orders   Fe+TIBC+Fer   BASIC METABOLIC PANEL WITH GFR   CBC   AKI (acute kidney injury) ([place])   Relevant Orders   Fe+TIBC+Fer   BASIC METABOLIC PANEL WITH GFR   CBC     Other   Complicated grief    Still really struggling with sleep quality and has been using half a tab of alprazolam.  Refill sent to pharmacy just reminded to use sparingly.      Relevant Medications   ALPRAZolam (XANAX) 0.25 MG tablet   ANEMIA, IRON DEFICIENCY - Primary   Relevant Orders   Fe+TIBC+Fer   BASIC METABOLIC PANEL WITH GFR   CBC   Other Visit Diagnoses      Skin yeast infection       Relevant Medications   nystatin (MYCOSTATIN/NYSTOP) powder      Acute kidney injury-plan to recheck serum creatinine from hospitalization.  We will go ahead and refill nystatin powder for her recurrent skin yeast infections that she gets.  She is continuing to lose weight which is great BMI is now down to 40.  Return in about 1 month (around 12/14/2021) for bp.    Beatrice Lecher, MD

## 2021-11-13 NOTE — Assessment & Plan Note (Signed)
She has been having more frequent headaches some of which are just more tension type headaches since being hospitalized.  Not sure if it is related to her blood pressure not being maximally controlled or other factors.  She still feels weak and tired but is trying to push herself to increase her stamina and energy.

## 2021-11-13 NOTE — Assessment & Plan Note (Addendum)
Not at goal.  Increase amlodipine to 10 mg daily  

## 2021-11-13 NOTE — Assessment & Plan Note (Signed)
Still really struggling with sleep quality and has been using half a tab of alprazolam.  Refill sent to pharmacy just reminded to use sparingly.

## 2021-11-13 NOTE — Assessment & Plan Note (Signed)
This point she is still recovering still struggling with fatigue's and frequent headaches and were still try to get her blood pressure under control.  We will extend work leave until December 1.  She will need to get his new and updated paperwork.

## 2021-11-14 LAB — CBC
HCT: 38.2 % (ref 35.0–45.0)
Hemoglobin: 12.6 g/dL (ref 11.7–15.5)
MCH: 30.1 pg (ref 27.0–33.0)
MCHC: 33 g/dL (ref 32.0–36.0)
MCV: 91.4 fL (ref 80.0–100.0)
MPV: 9.9 fL (ref 7.5–12.5)
Platelets: 295 10*3/uL (ref 140–400)
RBC: 4.18 10*6/uL (ref 3.80–5.10)
RDW: 13.8 % (ref 11.0–15.0)
WBC: 6.3 10*3/uL (ref 3.8–10.8)

## 2021-11-14 LAB — BASIC METABOLIC PANEL WITH GFR
BUN: 8 mg/dL (ref 7–25)
CO2: 27 mmol/L (ref 20–32)
Calcium: 9.8 mg/dL (ref 8.6–10.4)
Chloride: 108 mmol/L (ref 98–110)
Creat: 0.95 mg/dL (ref 0.50–1.03)
Glucose, Bld: 130 mg/dL — ABNORMAL HIGH (ref 65–99)
Potassium: 3.4 mmol/L — ABNORMAL LOW (ref 3.5–5.3)
Sodium: 143 mmol/L (ref 135–146)
eGFR: 69 mL/min/{1.73_m2} (ref >=60)

## 2021-11-14 LAB — IRON,TIBC AND FERRITIN PANEL
%SAT: 18 % (calc) (ref 16–45)
Ferritin: 103 ng/mL (ref 16–232)
Iron: 53 ug/dL (ref 45–160)
TIBC: 289 mcg/dL (calc) (ref 250–450)

## 2021-11-14 NOTE — Telephone Encounter (Signed)
Forms completed and placed in Tonya B box 

## 2021-11-14 NOTE — Progress Notes (Signed)
Hi Adreanna, kidney function looks much better it is back down to 0.9.  Potassium is just a little bit borderline low so just make sure you are trying to eat some potassium rich foods.  Things like bananas can be helpful as well as green leafy vegetables.  Iron level is normal but on the lower end so just continue to work on eating some iron rich foods.  Hemoglobin is back to normal.

## 2021-11-17 ENCOUNTER — Ambulatory Visit (INDEPENDENT_AMBULATORY_CARE_PROVIDER_SITE_OTHER): Payer: BC Managed Care – PPO | Admitting: Professional

## 2021-11-17 ENCOUNTER — Encounter: Payer: Self-pay | Admitting: Professional

## 2021-11-17 DIAGNOSIS — F321 Major depressive disorder, single episode, moderate: Secondary | ICD-10-CM | POA: Diagnosis not present

## 2021-11-17 DIAGNOSIS — F4322 Adjustment disorder with anxiety: Secondary | ICD-10-CM

## 2021-11-17 NOTE — Progress Notes (Signed)
[place] Counselor/Therapist Progress Note  Patient ID: Kristen Spence, MRN: [id number],    Date: 11/17/2021  Time Spent: 47 minutes 103-150pm  Treatment Type: Individual Therapy  Risk Assessment: Danger to Self:  No Self-injurious Behavior: No Danger to Others: No  Subjective: This session was held via audio teletherapy. The patient consented to audio and was located at her home during this session. She is aware it is the responsibility of the patient to secure confidentiality on her end of the session. The provider was in a private home office for the duration of this session.   The patient arrived late for her appointment due to computer issues. The session was held via telephone.  Issues addressed: 1- physical a-pt was in severe pain and believed she had a kidney stone -pt tried to give urine sample and she was only able to provide 4 drops -she was in severe pain and asked for something for pain -she did not feel well attended by staff -waited for about two hours and got two shots and discharged home b-pt drove home and went in to septic shock -pt had temp of 106, son took her to hospital -pt reports her kidney stopped, heart and lungs in bad shape -she was in for about 10 days prior to being discharged -the kidney MD did not initially remember her   -he opened her record and said he didn't think she was going to make it -it impacted her son a lot   -he was told two minutes longer and she would have been gone 2-mood -pt feels emotionally drained -thankful to be alive -grateful for her son's support  Treatment Plan Problems Addressed  Anxiety, Childhood Trauma, Grief / Loss Unresolved, Unipolar Depression  Goals 1. Begin a healthy grieving process around the loss. Objective Verbalize resolution of feelings of guilt and regret associated with the loss. Target Date: 2022-08-14 Frequency: Biweekly  Progress: 30 Modality: individual  Related  Interventions Assign the client to make a list of all the regrets associated with actions toward or relationship with the deceased; process the list content toward resolution of these feelings. Objective Begin verbalizing feelings associated with the loss. Target Date: 2022-08-14 Frequency: Biweekly  Progress: 50 Modality: individual  Related Interventions Assist the client in identifying and expressing feelings connected with his/her loss. Ask the client to bring pictures or mementos connected with his/her loss to a session and talk about them (or assign "Creating a [employer]" in the Adult Psychotherapy Homework Planner by Bryn Gulling). Objective Verbalize and resolve feelings of anger or guilt focused on self or deceased loved one (in-laws) that interfere with the grieving process. Target Date: 2022-08-14 Frequency: Biweekly  Progress: 30 Modality: individual  Related Interventions Encourage the client to forgive self and/or deceased to resolve his/her feelings of guilt or anger; recommend books on forgiveness (e.g., Forgive and Forget by Tomie China). Use nondirective techniques (e.g., active listening, clarification, summarization, reflection) to allow the client to express and process angry feelings connected to his/her loss. Objective Reengage in activities with family, friends, coworkers, and others. Target Date: 2022-08-14 Frequency: Biweekly  Progress: 40 Modality: individual  Related Interventions Assist the client in recommitting and reengaging in the primary social positive roles in which he/she has functioned prior to the loss. Promote behavioral activation by assisting the client in listing activities which he/she previously enjoyed but has not engaged in since experiencing the loss and then encourage reengagement in these activities (or assign "Identify and Schedule Pleasant Activities" in the  Adult Psychotherapy Doctor, general practice by Bryn Gulling). Objective Acknowledge dependency on  lost loved one and begin to refocus life on independent actions to meet emotional needs. Target Date: 2022-08-14 Frequency: Biweekly  Progress: 50 Modality: individual  Related Interventions Assist the client in identifying how he/she depended upon the significant other, expressing and resolving the accompanying feelings of abandonment and of being left alone. Explore the feelings of anger or guilt that surround the loss, helping the client understand the sources for such feelings. 2. Complete the process of letting go of the lost significant other. 3. Develop an awareness of how childhood issues have affected and continue to affect one's family life. 4. Develop an awareness of how the avoidance of grieving has affected life and begin the healing process. 5. Develop healthy interpersonal relationships that lead to the alleviation and help prevent the relapse of depression. Objective Learn and implement relapse prevention skills. Target Date: 2022-08-14 Frequency: Biweekly  Progress: 20 Modality: individual  Related Interventions Discuss with the client the distinction between a lapse and relapse, associating a lapse with a rather common, temporary setback that may involve, for example, re-experiencing a depressive thought and/or urge to withdraw or avoid (perhaps as related to some loss or conflict) and a relapse as a sustained return to a pattern of depressive thinking and feeling usually accompanied by interpersonal withdrawal and/or avoidance. Identify and rehearse with the client the management of future situations or circumstances in which lapses could occur. Build the client's relapse prevention skills by helping him/her identify early warning signs of relapse and rehearsing the use of skills learned during therapy to manage them. Objective Learn and implement behavioral strategies to overcome depression. Target Date: 2022-08-14 Frequency: Biweekly  Progress: 20 Modality: individual   Related Interventions Assist the client in developing skills that increase the likelihood of deriving pleasure from behavioral activation (e.g., assertiveness skills, developing an exercise plan, less internal/more external focus, increased social involvement); reinforce success. 6. Enhance ability to effectively cope with the full variety of life's worries and anxieties. 7. Learn and implement coping skills that result in a reduction of anxiety and worry, and improved daily functioning. Objective Learn and implement relapse prevention strategies for managing possible future anxiety symptoms. Target Date: 2022-08-14 Frequency: Biweekly  Progress: 20 Modality: individual  Related Interventions Discuss with the client the distinction between a lapse and relapse, associating a lapse with an initial and reversible return of worry, anxiety symptoms, or urges to avoid, and relapse with the decision to continue the fearful and avoidant patterns. Identify and rehearse with the client the management of future situations or circumstances in which lapses could occur. Instruct the client to routinely use new therapeutic skills (e.g., relaxation, cognitive restructuring, exposure, and problem-solving) in daily life to address emergent worries, anxiety, and avoidant tendencies. Develop a "coping card" on which coping strategies and other important information (e.g., "Breathe deeply and relax," "Challenge unrealistic worries," "Use problem-solving") are written for the client's later use. Objective Maintain involvement in work, family, and social activities. Target Date: 2022-08-14 Frequency: Biweekly  Progress: 25 Modality: individual  Related Interventions Support the client in following through with work, family, and social activities rather than escaping or avoiding them to focus on anxiety. Objective Learn to accept limitations in life and commit to tolerating, rather than avoiding, unpleasant emotions  while accomplishing meaningful goals. Target Date: 2022-08-14 Frequency: Biweekly  Progress: 30 Modality: individual  Related Interventions Use techniques from Acceptance and Commitment Therapy to help client accept uncomfortable realities such as  lack of complete control, imperfections, and uncertainty and tolerate unpleasant emotions and thoughts in order to accomplish value-consistent goals. Objective Identify and engage in pleasant activities on a daily basis. Target Date: 2022-08-14 Frequency: Biweekly  Progress: 15 Modality: individual  Related Interventions Engage the client in behavioral activation, increasing the client's contact with sources of reward, identifying processes that inhibit activation, and teaching skills to solve life problems (or assign "Identify and Schedule Pleasant Activities" in the Adult Psychotherapy Homework Planner by Jongsma); use behavioral techniques such as instruction, rehearsal, role-playing, role reversal as needed to assist adoption into the client's daily life; reinforce success. 8. Recognize, accept, and cope with feelings of depression. 9. Release the emotions associated with past childhood/family issues, resulting in less resentment and more serenity. Objective Describe each family member and identify the role each played within the family. Target Date: 2022-08-14 Frequency: Biweekly  Progress: 0 Modality: individual  Related Interventions Assist the client in clarifying his/her role within the family and his/her feelings connected to that role. Objective Identify feelings associated with major traumatic incidents in childhood and with parental child-rearing patterns. Target Date: 2022-08-14 Frequency: Biweekly  Progress: 0 Modality: individual  Related Interventions Support and encourage the client when he/she begins to express feelings of rage, sadness, fear, and rejection relating to family abuse or neglect. 10. Resolve the loss, reengaging in  old relationships and initiating new contacts with others. 11. Stabilize anxiety level while increasing ability to function on a daily basis.  Diagnosis:Current moderate episode of major depressive disorder without prior episode ([place])  Adjustment disorder with anxiety  Plan:  -next appointment will be Monday, December 08, 2021 at [facility], [facility]

## 2021-12-01 ENCOUNTER — Other Ambulatory Visit: Payer: Self-pay | Admitting: Family Medicine

## 2021-12-08 ENCOUNTER — Encounter: Payer: Self-pay | Admitting: Professional

## 2021-12-08 ENCOUNTER — Ambulatory Visit (INDEPENDENT_AMBULATORY_CARE_PROVIDER_SITE_OTHER): Payer: BC Managed Care – PPO | Admitting: Professional

## 2021-12-08 DIAGNOSIS — F321 Major depressive disorder, single episode, moderate: Secondary | ICD-10-CM

## 2021-12-08 DIAGNOSIS — F4322 Adjustment disorder with anxiety: Secondary | ICD-10-CM

## 2021-12-08 NOTE — Progress Notes (Signed)
[facility] Counselor/Therapist Progress Note  Patient ID: Kristen Spence, MRN: [id number],    Date: 12/08/2021  Time Spent: 38 minutes 102-140pm  Treatment Type: Individual Therapy  Risk Assessment: Danger to Self:  No Self-injurious Behavior: No Danger to Others: No  Subjective: This session was held via video teletherapy. The patient consented to video and was located at her home during this session. She is aware it is the responsibility of the patient to secure confidentiality on her end of the session. The provider was in a private home office for the duration of this session.   The patient arrived on time for her webex appointment.  Issues addressed: 1- physical a-pt still struggling with getting her health back -still feels weak -unsure of kidney functioning 2-mood a-pt admits that she has been feeling depressed but is fighting it hard b-she use redirection when she feels depressed and works to think about something else c-she believes it has been going on since September -since she has started getting well -increased worry about the potential to get sick again -pt feels afraid of people because of her fear of getting sick 3-triggers a-death of spouse on 2019/11/30 b-holidays c-her birthday on [birth year] d-her recent e-coli infection and kidney failure   -fear that she will get sick again   -avoiding people 3-Thanksgiving a-pt unsure she wants to go to son's home b-pt admits that she worries about being around others -discussed with pt potential for her to wear a mask and go for as long as she feels comfortable -pt thinks she will try because she knows it will make her son happy and he has done so much for her  Treatment Plan Problems Addressed  Anxiety, Childhood Trauma, Grief / Loss Unresolved, Unipolar Depression  Goals 1. Begin a healthy grieving process around the loss. Objective Verbalize resolution of feelings of guilt and regret  associated with the loss. Target Date: 2022-08-14 Frequency: Biweekly  Progress: 30 Modality: individual  Related Interventions Assign the client to make a list of all the regrets associated with actions toward or relationship with the deceased; process the list content toward resolution of these feelings. Objective Begin verbalizing feelings associated with the loss. Target Date: 2022-08-14 Frequency: Biweekly  Progress: 50 Modality: individual  Related Interventions Assist the client in identifying and expressing feelings connected with his/her loss. Ask the client to bring pictures or mementos connected with his/her loss to a session and talk about them (or assign "Creating a  [place]" in the Adult Psychotherapy Homework Planner by Stephannie Li). Objective Verbalize and resolve feelings of anger or guilt focused on self or deceased loved one (in-laws) that interfere with the grieving process. Target Date: 2022-08-14 Frequency: Biweekly  Progress: 30 Modality: individual  Related Interventions Encourage the client to forgive self and/or deceased to resolve his/her feelings of guilt or anger; recommend books on forgiveness (e.g., Forgive and Forget by Francina Ames). Use nondirective techniques (e.g., active listening, clarification, summarization, reflection) to allow the client to express and process angry feelings connected to his/her loss. Objective Reengage in activities with family, friends, coworkers, and others. Target Date: 2022-08-14 Frequency: Biweekly  Progress: 40 Modality: individual  Related Interventions Assist the client in recommitting and reengaging in the primary social positive roles in which he/she has functioned prior to the loss. Promote behavioral activation by assisting the client in listing activities which he/she previously enjoyed but has not engaged in since experiencing the loss and then encourage reengagement in these activities (or assign "  Identify and Schedule  Pleasant Activities" in the Adult Psychotherapy Homework Planner by Stephannie Li). Objective Acknowledge dependency on lost loved one and begin to refocus life on independent actions to meet emotional needs. Target Date: 2022-08-14 Frequency: Biweekly  Progress: 50 Modality: individual  Related Interventions Assist the client in identifying how he/she depended upon the significant other, expressing and resolving the accompanying feelings of abandonment and of being left alone. Explore the feelings of anger or guilt that surround the loss, helping the client understand the sources for such feelings. 2. Complete the process of letting go of the lost significant other. 3. Develop an awareness of how childhood issues have affected and continue to affect one's family life. 4. Develop an awareness of how the avoidance of grieving has affected life and begin the healing process. 5. Develop healthy interpersonal relationships that lead to the alleviation and help prevent the relapse of depression. Objective Learn and implement relapse prevention skills. Target Date: 2022-08-14 Frequency: Biweekly  Progress: 20 Modality: individual  Related Interventions Discuss with the client the distinction between a lapse and relapse, associating a lapse with a rather common, temporary setback that may involve, for example, re-experiencing a depressive thought and/or urge to withdraw or avoid (perhaps as related to some loss or conflict) and a relapse as a sustained return to a pattern of depressive thinking and feeling usually accompanied by interpersonal withdrawal and/or avoidance. Identify and rehearse with the client the management of future situations or circumstances in which lapses could occur. Build the client's relapse prevention skills by helping him/her identify early warning signs of relapse and rehearsing the use of skills learned during therapy to manage them. Objective Learn and implement behavioral  strategies to overcome depression. Target Date: 2022-08-14 Frequency: Biweekly  Progress: 20 Modality: individual  Related Interventions Assist the client in developing skills that increase the likelihood of deriving pleasure from behavioral activation (e.g., assertiveness skills, developing an exercise plan, less internal/more external focus, increased social involvement); reinforce success. 6. Enhance ability to effectively cope with the full variety of life's worries and anxieties. 7. Learn and implement coping skills that result in a reduction of anxiety and worry, and improved daily functioning. Objective Learn and implement relapse prevention strategies for managing possible future anxiety symptoms. Target Date: 2022-08-14 Frequency: Biweekly  Progress: 20 Modality: individual  Related Interventions Discuss with the client the distinction between a lapse and relapse, associating a lapse with an initial and reversible return of worry, anxiety symptoms, or urges to avoid, and relapse with the decision to continue the fearful and avoidant patterns. Identify and rehearse with the client the management of future situations or circumstances in which lapses could occur. Instruct the client to routinely use new therapeutic skills (e.g., relaxation, cognitive restructuring, exposure, and problem-solving) in daily life to address emergent worries, anxiety, and avoidant tendencies. Develop a "coping card" on which coping strategies and other important information (e.g., "Breathe deeply and relax," "Challenge unrealistic worries," "Use problem-solving") are written for the client's later use. Objective Maintain involvement in work, family, and social activities. Target Date: 2022-08-14 Frequency: Biweekly  Progress: 25 Modality: individual  Related Interventions Support the client in following through with work, family, and social activities rather than escaping or avoiding them to focus on  anxiety. Objective Learn to accept limitations in life and commit to tolerating, rather than avoiding, unpleasant emotions while accomplishing meaningful goals. Target Date: 2022-08-14 Frequency: Biweekly  Progress: 30 Modality: individual  Related Interventions Use techniques from Acceptance and Commitment Therapy to  help client accept uncomfortable realities such as lack of complete control, imperfections, and uncertainty and tolerate unpleasant emotions and thoughts in order to accomplish value-consistent goals. Objective Identify and engage in pleasant activities on a daily basis. Target Date: 2022-08-14 Frequency: Biweekly  Progress: 15 Modality: individual  Related Interventions Engage the client in behavioral activation, increasing the client's contact with sources of reward, identifying processes that inhibit activation, and teaching skills to solve life problems (or assign "Identify and Schedule Pleasant Activities" in the Adult Psychotherapy Homework Planner by Jongsma); use behavioral techniques such as instruction, rehearsal, role-playing, role reversal as needed to assist adoption into the client's daily life; reinforce success. 8. Recognize, accept, and cope with feelings of depression. 9. Release the emotions associated with past childhood/family issues, resulting in less resentment and more serenity. Objective Describe each family member and identify the role each played within the family. Target Date: 2022-08-14 Frequency: Biweekly  Progress: 0 Modality: individual  Related Interventions Assist the client in clarifying his/her role within the family and his/her feelings connected to that role. Objective Identify feelings associated with major traumatic incidents in childhood and with parental child-rearing patterns. Target Date: 2022-08-14 Frequency: Biweekly  Progress: 0 Modality: individual  Related Interventions Support and encourage the client when he/she begins to  express feelings of rage, sadness, fear, and rejection relating to family abuse or neglect. 10. Resolve the loss, reengaging in old relationships and initiating new contacts with others. 11. Stabilize anxiety level while increasing ability to function on a daily basis.  Diagnosis:Current moderate episode of major depressive disorder without prior episode ([facility])  Adjustment disorder with anxiety  Plan:  -next appointment will be Monday, December 08, 2021 at [facility]

## 2021-12-09 ENCOUNTER — Encounter: Payer: Self-pay | Admitting: Family Medicine

## 2021-12-09 ENCOUNTER — Ambulatory Visit: Payer: BC Managed Care – PPO | Admitting: Family Medicine

## 2021-12-09 VITALS — BP 132/82 | HR 76 | Ht 62.0 in | Wt 220.0 lb

## 2021-12-09 DIAGNOSIS — N200 Calculus of kidney: Secondary | ICD-10-CM | POA: Diagnosis not present

## 2021-12-09 DIAGNOSIS — N1330 Unspecified hydronephrosis: Secondary | ICD-10-CM | POA: Diagnosis not present

## 2021-12-09 DIAGNOSIS — J019 Acute sinusitis, unspecified: Secondary | ICD-10-CM | POA: Diagnosis not present

## 2021-12-09 DIAGNOSIS — R8290 Unspecified abnormal findings in urine: Secondary | ICD-10-CM | POA: Diagnosis not present

## 2021-12-09 MED ORDER — IPRATROPIUM BROMIDE 0.03 % NA SOLN: 2.0000 | Freq: Two times a day (BID) | NASAL | 1 refills | Status: DC

## 2021-12-09 MED ORDER — HYDROCODONE BIT-HOMATROP MBR 5-1.5 MG/5ML PO SOLN: 5.0000 mL | Freq: Three times a day (TID) | ORAL | 0 refills | Status: DC | PRN

## 2021-12-09 MED ORDER — CVS ASPIRIN ADULT LOW DOSE 81 MG PO CHEW: 81.0000 mg | CHEWABLE_TABLET | Freq: Every day | ORAL | 3 refills | Status: AC

## 2021-12-09 MED ORDER — AZITHROMYCIN 250 MG PO TABS: ORAL_TABLET | ORAL | 0 refills | Status: AC

## 2021-12-09 NOTE — Progress Notes (Signed)
   Established Patient Office Visit  Subjective   Patient ID: Kristen Spence, female    DOB: 20-Jun-1960  Age: 61 y.o. MRN: [id number]  Chief Complaint  Patient presents with   Sinus Problem    HPI She started with nasal congestion headache facial pressure and fatigue on Saturday about 4 days ago.  She said she really was not around anybody has been sick she is mostly been at home.  But her neighbor has been burning leaves and she was around it outdoors and that is when she started feeling bad.  She now has laryngitis and has lost her voice she is also had a sore throat.  Has had tons of drainage that is causing her to cough.  Is been keeping her awake at night.     ROS    Objective:     BP 132/82 (BP Location: Left Arm, Patient Position: Sitting, Cuff Size: Large)   Pulse 76   Ht 5\' 2"  (1.575 m)   Wt 220 lb 0.6 oz (99.8 kg)   SpO2 94%   BMI 40.25 kg/m    Physical Exam Constitutional:      Appearance: She is well-developed.  HENT:     Head: Normocephalic and atraumatic.     Right Ear: Tympanic membrane, ear canal and external ear normal.     Left Ear: Tympanic membrane, ear canal and external ear normal.     Nose: Nose normal.     Mouth/Throat:     Comments: Mild erythema and cobblestoning. Eyes:     Conjunctiva/sclera: Conjunctivae normal.     Pupils: Pupils are equal, round, and reactive to light.  Neck:     Thyroid: No thyromegaly.  Cardiovascular:     Rate and Rhythm: Normal rate and regular rhythm.     Heart sounds: Normal heart sounds.  Pulmonary:     Effort: Pulmonary effort is normal.     Breath sounds: Normal breath sounds. No wheezing.  Musculoskeletal:     Cervical back: Neck supple.  Lymphadenopathy:     Cervical: No cervical adenopathy.  Skin:    General: Skin is warm and dry.  Neurological:     Mental Status: She is alert and oriented to person, place, and time.      No results found for any visits on 12/09/21.    The ASCVD Risk score  (Arnett DK, et al., 2019) failed to calculate for the following reasons:   Unable to determine if patient is Non-Hispanic African American    Assessment & Plan:   Problem List Items Addressed This Visit   None Visit Diagnoses     Acute non-recurrent sinusitis, unspecified location    -  Primary   Relevant Medications   azithromycin (ZITHROMAX) 250 MG tablet   HYDROcodone bit-homatropine (HYCODAN) 5-1.5 MG/5ML syrup   ipratropium (ATROVENT) 0.03 % nasal spray      Acute sinusitis-we will treat with azithromycin and also given a prescription for cough medicine to use as needed.  She would like something for the excess drainage so we can try ipratropium nasal spray.  If it is overly drying please discontinue the nasal spray.  Okay to continue over-the-counter symptomatic care as well.  No follow-ups on file.    2020, MD

## 2021-12-16 ENCOUNTER — Ambulatory Visit (INDEPENDENT_AMBULATORY_CARE_PROVIDER_SITE_OTHER): Payer: BC Managed Care – PPO | Admitting: Family Medicine

## 2021-12-16 ENCOUNTER — Encounter: Payer: Self-pay | Admitting: Family Medicine

## 2021-12-16 VITALS — BP 148/77 | HR 70 | Ht 62.0 in | Wt 223.0 lb

## 2021-12-16 DIAGNOSIS — E876 Hypokalemia: Secondary | ICD-10-CM | POA: Diagnosis not present

## 2021-12-16 DIAGNOSIS — I10 Essential (primary) hypertension: Secondary | ICD-10-CM

## 2021-12-16 DIAGNOSIS — Z7689 Persons encountering health services in other specified circumstances: Secondary | ICD-10-CM

## 2021-12-16 DIAGNOSIS — R3129 Other microscopic hematuria: Secondary | ICD-10-CM

## 2021-12-16 LAB — POCT URINALYSIS DIP (CLINITEK)
Bilirubin, UA: NEGATIVE
Glucose, UA: NEGATIVE mg/dL
Ketones, POC UA: NEGATIVE mg/dL
Leukocytes, UA: NEGATIVE
Nitrite, UA: NEGATIVE
Spec Grav, UA: 1.025 (ref 1.010–1.025)
Urobilinogen, UA: 0.2 E.U./dL
pH, UA: 5.5 (ref 5.0–8.0)

## 2021-12-16 MED ORDER — WEGOVY 0.25 MG/0.5ML ~~LOC~~ SOAJ: 0.2500 mg | SUBCUTANEOUS | 0 refills | Status: DC

## 2021-12-16 NOTE — Assessment & Plan Note (Addendum)
Discussed the option of Wegovy in the side effects of the medication and how it works.  Visit #: 1 Starting Weight: 223 lbs   Current weight: 223 lbs Previous weight: Change in weight: Goal weight: 150 lbs  Dietary goals: continue to avoid soda and work on inc protein and veggies intake.  Exercise goals:Stay active  Medication: Start [facility] 0.25mg   Follow-up and referrals: 8 weeks.

## 2021-12-16 NOTE — Progress Notes (Signed)
Established Patient Office Visit  Subjective   Patient ID: Kristen Spence, female    DOB: 08/05/1960  Age: 61 y.o. MRN: [id number]  Chief Complaint  Patient presents with   Hypertension    HPI  Hypertension-we increased her amlodipine about a month ago.  She did come in about a week ago for upper respiratory infection.  She is feeling better from her recent bout with sinusitis.  She is feeling some better but still has a persistent cough particularly at night.  She still getting some occasional mucus.  He says still raspy.  Ports that she has been sleeping a lot lately since she came home from the hospital.  She just has not quite got her energy level yet.  But she does feel like she is okay to return to work on Monday.  Really wants to work on weight loss her goal is to lose about 100 pounds.  She did check with her insurance and they report that they will cover Wegovy, Saxenda and Qsymia with prior authorization.  She is cut out soda since her hospitalization and has lost some weight since then.  She says she just has not had much of an appetite in general.  Follow-up with the urologist.  They did not make any changes they still want her to push lots of fluids to reduce her risk for uric acid stones.  She still has some swelling/hydronephrosis of the right kidney there can follow her up again in about 3 months.    ROS    Objective:     BP (!) 148/77   Pulse 70   Ht 5\' 2"  (1.575 m)   Wt 223 lb (101.2 kg)   SpO2 95%   BMI 40.79 kg/m    Physical Exam Vitals and nursing note reviewed.  Constitutional:      Appearance: She is well-developed.  HENT:     Head: Normocephalic and atraumatic.  Cardiovascular:     Rate and Rhythm: Normal rate and regular rhythm.     Heart sounds: Normal heart sounds.  Pulmonary:     Effort: Pulmonary effort is normal.     Breath sounds: Normal breath sounds.  Skin:    General: Skin is warm and dry.  Neurological:     Mental Status:  She is alert and oriented to person, place, and time.  Psychiatric:        Behavior: Behavior normal.     Results for orders placed or performed in visit on 12/16/21  POCT URINALYSIS DIP (CLINITEK)  Result Value Ref Range   Color, UA yellow yellow   Clarity, UA cloudy (A) clear   Glucose, UA negative negative mg/dL   Bilirubin, UA negative negative   Ketones, POC UA negative negative mg/dL   Spec Grav, UA 12/18/21 8.502 - 1.025   Blood, UA small (A) negative   pH, UA 5.5 5.0 - 8.0   POC PROTEIN,UA trace negative, trace   Urobilinogen, UA 0.2 0.2 or 1.0 E.U./dL   Nitrite, UA Negative Negative   Leukocytes, UA Negative Negative      The ASCVD Risk score (Arnett DK, et al., 2019) failed to calculate for the following reasons:   Unable to determine if patient is Non-Hispanic African American    Assessment & Plan:   Problem List Items Addressed This Visit       Cardiovascular and Mediastinum   HYPERTENSION, BENIGN - Primary    Blood pressure is up a little bit today but  look great when she was here a couple of weeks ago so we will continue to monitor for now if it still elevated at next visit then we will need to make an adjustment to her blood pressure regimen.      Relevant Orders   BASIC METABOLIC PANEL WITH GFR     Other   Encounter for weight management    Discussed the option of Wegovy in the side effects of the medication and how it works.  Visit #: 1 Starting Weight: 223 lbs   Current weight: 223 lbs Previous weight: Change in weight: Goal weight: 150 lbs  Dietary goals: continue to avoid soda and work on inc protein and veggies intake.  Exercise goals:Stay active  Medication: Start Wegovy 0.25mg   Follow-up and referrals: 8 weeks.        Relevant Medications   Semaglutide-Weight Management (WEGOVY) 0.25 MG/0.5ML SOAJ   Other Visit Diagnoses     Hypokalemia       Relevant Orders   BASIC METABOLIC PANEL WITH GFR   Other microscopic hematuria        Relevant Orders   POCT URINALYSIS DIP (CLINITEK) (Completed)      Hypokalemia-due to recheck potassium level today.  We just encouraged her to try to increase potassium naturally through her diet.  Return in about 7 weeks (around 02/03/2022) for Weight Mangement.  Nani Gasser, MD

## 2021-12-16 NOTE — Assessment & Plan Note (Signed)
Blood pressure is up a little bit today but look great when she was here a couple of weeks ago so we will continue to monitor for now if it still elevated at next visit then we will need to make an adjustment to her blood pressure regimen.

## 2021-12-17 LAB — BASIC METABOLIC PANEL WITH GFR
BUN: 10 mg/dL (ref 7–25)
CO2: 23 mmol/L (ref 20–32)
Calcium: 9.7 mg/dL (ref 8.6–10.4)
Chloride: 108 mmol/L (ref 98–110)
Creat: 0.9 mg/dL (ref 0.50–1.05)
Glucose, Bld: 73 mg/dL (ref 65–99)
Potassium: 3.9 mmol/L (ref 3.5–5.3)
Sodium: 143 mmol/L (ref 135–146)
eGFR: 73 mL/min/{1.73_m2} (ref >=60)

## 2021-12-17 NOTE — Progress Notes (Signed)
Hi Calani, potassium looks great!

## 2021-12-18 ENCOUNTER — Telehealth: Payer: Self-pay

## 2021-12-18 NOTE — Telephone Encounter (Addendum)
Initiated Prior authorization DTH:YHOOIL 0.25MG /0.5ML auto-injectors Via: Covermymeds Case/Key:[id number] Status: approved  as of 12/18/21 Reason: Notified Pt via: Mychart

## 2021-12-20 ENCOUNTER — Other Ambulatory Visit: Payer: Self-pay | Admitting: Family Medicine

## 2021-12-20 DIAGNOSIS — K2100 Gastro-esophageal reflux disease with esophagitis, without bleeding: Secondary | ICD-10-CM

## 2021-12-20 DIAGNOSIS — I10 Essential (primary) hypertension: Secondary | ICD-10-CM

## 2021-12-25 ENCOUNTER — Other Ambulatory Visit: Payer: Self-pay | Admitting: Family Medicine

## 2021-12-25 DIAGNOSIS — F4321 Adjustment disorder with depressed mood: Secondary | ICD-10-CM

## 2021-12-25 DIAGNOSIS — K2100 Gastro-esophageal reflux disease with esophagitis, without bleeding: Secondary | ICD-10-CM

## 2021-12-25 MED ORDER — DULOXETINE HCL 60 MG PO CPEP: ORAL_CAPSULE | ORAL | 0 refills | Status: DC

## 2021-12-31 ENCOUNTER — Other Ambulatory Visit: Payer: Self-pay | Admitting: Family Medicine

## 2022-01-05 ENCOUNTER — Ambulatory Visit: Payer: BC Managed Care – PPO | Admitting: Professional

## 2022-02-02 ENCOUNTER — Ambulatory Visit: Payer: BC Managed Care – PPO | Admitting: Professional

## 2022-02-03 ENCOUNTER — Ambulatory Visit (INDEPENDENT_AMBULATORY_CARE_PROVIDER_SITE_OTHER): Payer: BC Managed Care – PPO | Admitting: Professional

## 2022-02-03 ENCOUNTER — Encounter: Payer: Self-pay | Admitting: Professional

## 2022-02-03 DIAGNOSIS — F321 Major depressive disorder, single episode, moderate: Secondary | ICD-10-CM

## 2022-02-03 DIAGNOSIS — F4322 Adjustment disorder with anxiety: Secondary | ICD-10-CM | POA: Diagnosis not present

## 2022-02-03 NOTE — Progress Notes (Signed)
[facility] Counselor/Therapist Progress Note  Patient ID: Kristen Spence, MRN: [id number],    Date: 02/03/2022  Time Spent:  45 minutes [id number]  Treatment Type: Individual Therapy  Risk Assessment: Danger to Self:  No Self-injurious Behavior: No Danger to Others: No  Subjective: This session was held via video teletherapy. The patient consented to video and was located at her home during this session. She is aware it is the responsibility of the patient to secure confidentiality on her end of the session. The provider was in a private home office for the duration of this session.   The patient arrived on time for her webex appointment.  Issues addressed: 1- death of mother-in-law a-funeral went fine b-will have to interface with her brother-in-law since her spouse was on the house -has her attorney from when her spouse passed who is going to manage the estate issues 2-professional a-pt tried going back to the office -went on two occasions and pushed through -reports she was on the verge of a panic attack but was able to control -was exhausted when she came home b-pt would like to be able to have herself "back again" -one day she hopes to be there 3-coping -educated -examples -anxiety cycle -grounding  Treatment Plan Problems Addressed  Anxiety, Childhood Trauma, Grief / Loss Unresolved, Unipolar Depression  Goals 1. Begin a healthy grieving process around the loss. Objective Verbalize resolution of feelings of guilt and regret associated with the loss. Target Date: 2022-08-14 Frequency: Biweekly  Progress: 30 Modality: individual  Related Interventions Assign the client to make a list of all the regrets associated with actions toward or relationship with the deceased; process the list content toward resolution of these feelings. Objective Begin verbalizing feelings associated with the loss. Target Date: 2022-08-14 Frequency: Biweekly  Progress: 50  Modality: individual  Related Interventions Assist the client in identifying and expressing feelings connected with his/her loss. Ask the client to bring pictures or mementos connected with his/her loss to a session and talk about them (or assign "Creating a [place] [employer] [place]" in the Adult Psychotherapy Homework Planner by Stephannie Li). Objective Verbalize and resolve feelings of anger or guilt focused on self or deceased loved one (in-laws) that interfere with the grieving process. Target Date: 2022-08-14 Frequency: Biweekly  Progress: 30 Modality: individual  Related Interventions Encourage the client to forgive self and/or deceased to resolve his/her feelings of guilt or anger; recommend books on forgiveness (e.g., Forgive and Forget by Francina Ames). Use nondirective techniques (e.g., active listening, clarification, summarization, reflection) to allow the client to express and process angry feelings connected to his/her loss. Objective Reengage in activities with family, friends, coworkers, and others. Target Date: 2022-08-14 Frequency: Biweekly  Progress: 40 Modality: individual  Related Interventions Assist the client in recommitting and reengaging in the primary social positive roles in which he/she has functioned prior to the loss. Promote behavioral activation by assisting the client in listing activities which he/she previously enjoyed but has not engaged in since experiencing the loss and then encourage reengagement in these activities (or assign "Identify and Schedule Pleasant Activities" in the Adult Psychotherapy Homework Planner by Stephannie Li). Objective Acknowledge dependency on lost loved one and begin to refocus life on independent actions to meet emotional needs. Target Date: 2022-08-14 Frequency: Biweekly  Progress: 50 Modality: individual  Related Interventions Assist the client in identifying how he/she depended upon the significant other, expressing and resolving the accompanying  feelings of abandonment and of being left alone. Explore the feelings of anger or  guilt that surround the loss, helping the client understand the sources for such feelings. 2. Complete the process of letting go of the lost significant other. 3. Develop an awareness of how childhood issues have affected and continue to affect one's family life. 4. Develop an awareness of how the avoidance of grieving has affected life and begin the healing process. 5. Develop healthy interpersonal relationships that lead to the alleviation and help prevent the relapse of depression. Objective Learn and implement relapse prevention skills. Target Date: 2022-08-14 Frequency: Biweekly  Progress: 20 Modality: individual  Related Interventions Discuss with the client the distinction between a lapse and relapse, associating a lapse with a rather common, temporary setback that may involve, for example, re-experiencing a depressive thought and/or urge to withdraw or avoid (perhaps as related to some loss or conflict) and a relapse as a sustained return to a pattern of depressive thinking and feeling usually accompanied by interpersonal withdrawal and/or avoidance. Identify and rehearse with the client the management of future situations or circumstances in which lapses could occur. Build the client's relapse prevention skills by helping him/her identify early warning signs of relapse and rehearsing the use of skills learned during therapy to manage them. Objective Learn and implement behavioral strategies to overcome depression. Target Date: 2022-08-14 Frequency: Biweekly  Progress: 20 Modality: individual  Related Interventions Assist the client in developing skills that increase the likelihood of deriving pleasure from behavioral activation (e.g., assertiveness skills, developing an exercise plan, less internal/more external focus, increased social involvement); reinforce success. 6. Enhance ability to effectively cope  with the full variety of life's worries and anxieties. 7. Learn and implement coping skills that result in a reduction of anxiety and worry, and improved daily functioning. Objective Learn and implement relapse prevention strategies for managing possible future anxiety symptoms. Target Date: 2022-08-14 Frequency: Biweekly  Progress: 20 Modality: individual  Related Interventions Discuss with the client the distinction between a lapse and relapse, associating a lapse with an initial and reversible return of worry, anxiety symptoms, or urges to avoid, and relapse with the decision to continue the fearful and avoidant patterns. Identify and rehearse with the client the management of future situations or circumstances in which lapses could occur. Instruct the client to routinely use new therapeutic skills (e.g., relaxation, cognitive restructuring, exposure, and problem-solving) in daily life to address emergent worries, anxiety, and avoidant tendencies. Develop a "coping card" on which coping strategies and other important information (e.g., "Breathe deeply and relax," "Challenge unrealistic worries," "Use problem-solving") are written for the client's later use. Objective Maintain involvement in work, family, and social activities. Target Date: 2022-08-14 Frequency: Biweekly  Progress: 25 Modality: individual  Related Interventions Support the client in following through with work, family, and social activities rather than escaping or avoiding them to focus on anxiety. Objective Learn to accept limitations in life and commit to tolerating, rather than avoiding, unpleasant emotions while accomplishing meaningful goals. Target Date: 2022-08-14 Frequency: Biweekly  Progress: 30 Modality: individual  Related Interventions Use techniques from Acceptance and Commitment Therapy to help client accept uncomfortable realities such as lack of complete control, imperfections, and uncertainty and tolerate  unpleasant emotions and thoughts in order to accomplish value-consistent goals. Objective Identify and engage in pleasant activities on a daily basis. Target Date: 2022-08-14 Frequency: Biweekly  Progress: 15 Modality: individual  Related Interventions Engage the client in behavioral activation, increasing the client's contact with sources of reward, identifying processes that inhibit activation, and teaching skills to solve life problems (or  assign "Identify and Schedule Pleasant Activities" in the Adult Psychotherapy Homework Planner by [facility]); use behavioral techniques such as instruction, rehearsal, role-playing, role reversal as needed to assist adoption into the client's daily life; reinforce success. 8. Recognize, accept, and cope with feelings of depression. 9. Release the emotions associated with past childhood/family issues, resulting in less resentment and more serenity. Objective Describe each family member and identify the role each played within the family. Target Date: 2022-08-14 Frequency: Biweekly  Progress: 0 Modality: individual  Related Interventions Assist the client in clarifying his/her role within the family and his/her feelings connected to that role. Objective Identify feelings associated with major traumatic incidents in childhood and with parental child-rearing patterns. Target Date: 2022-08-14 Frequency: Biweekly  Progress: 0 Modality: individual  Related Interventions Support and encourage the client when he/she begins to express feelings of rage, sadness, fear, and rejection relating to family abuse or neglect. 10. Resolve the loss, reengaging in old relationships and initiating new contacts with others. 11. Stabilize anxiety level while increasing ability to function on a daily basis.  Diagnosis:Current moderate episode of major depressive disorder without prior episode ([place])  Adjustment disorder with anxiety  Plan:  -next appointment will be Tuesday,  March 03, 2022 at 8am.

## 2022-02-04 ENCOUNTER — Ambulatory Visit: Payer: BC Managed Care – PPO | Admitting: Family Medicine

## 2022-02-04 ENCOUNTER — Encounter: Payer: Self-pay | Admitting: Family Medicine

## 2022-02-04 VITALS — BP 149/86 | HR 69 | Ht 62.0 in | Wt 221.0 lb

## 2022-02-04 DIAGNOSIS — F4322 Adjustment disorder with anxiety: Secondary | ICD-10-CM

## 2022-02-04 DIAGNOSIS — Z7689 Persons encountering health services in other specified circumstances: Secondary | ICD-10-CM

## 2022-02-04 DIAGNOSIS — F4321 Adjustment disorder with depressed mood: Secondary | ICD-10-CM

## 2022-02-04 MED ORDER — DULOXETINE HCL 60 MG PO CPEP: ORAL_CAPSULE | ORAL | 3 refills | Status: DC

## 2022-02-04 NOTE — Progress Notes (Signed)
Established Patient Office Visit  Subjective   Patient ID: Kristen Spence, female    DOB: [birth year]  Age: 62 y.o. MRN: [id number]  Chief Complaint  Patient presents with   Weight Check    HPI Weight Check -unfortunately she was unable to get the [facility].  She is called around to multiple pharmacies and no one has it in stock.  She has not given up quite yet.  She says that Minette [facility] is also covered on her insurance plan and so is willing to consider that as an option as well.  He is already started making some changes she is cut out soda.  And is trying to be more conscious about her eating habits.  She is not currently exercising.  Follow-up depression/anxiety-she is currently going to therapy.  On Cymbalta 60 mg twice a day.  She did try going back to work in person and just felt really anxious and really struggled especially just having a lot of people close to her and really made her anxious about getting sick and picking up an illness.     ROS    Objective:     BP (!) 149/86   Pulse 69   Ht 5\' 2"  (1.575 m)   Wt 221 lb (100.2 kg)   SpO2 95%   BMI 40.42 kg/m    Physical Exam Vitals and nursing note reviewed.  Constitutional:      Appearance: She is well-developed.  HENT:     Head: Normocephalic and atraumatic.  Cardiovascular:     Rate and Rhythm: Normal rate and regular rhythm.     Heart sounds: Normal heart sounds.  Pulmonary:     Effort: Pulmonary effort is normal.     Breath sounds: Normal breath sounds.  Skin:    General: Skin is warm and dry.  Neurological:     Mental Status: She is alert and oriented to person, place, and time.  Psychiatric:        Behavior: Behavior normal.      No results found for any visits on 02/04/22.    The ASCVD Risk score (Arnett DK, et al., 2019) failed to calculate for the following reasons:   Unable to determine if patient is Non-Hispanic African American    Assessment & Plan:   Problem List Items Addressed  This Visit       Other   Encounter for weight management - Primary    Visit #: 2 Starting Weight: 223 lbs    Current weight: 221 lbs Previous weight:223 lbs Change in weight: down 2 lbs  Goal weight: 150 lbs  Dietary goals: continue to avoid soda and work on inc protein and veggies intake.  Exercise goals:Stay active, start doing some band exercises for strengthening. Medication: Start [facility] 0.25mg  Follow-up and referrals: 8 weeks   And to consider Contrave if we cannot get the [facility].      Complicated grief   Relevant Medications   DULoxetine (CYMBALTA) 60 MG capsule   Adjustment disorder with anxiety    We should be getting forms from her work.  I recommend that she be able to continue to work from home for now.  Will continue to work on addressing her anxiety.  She normally is more extroverted and really a people person but she is really been struggling since having some traumatic health events.       If she is not able to get the [facility] we could consider Contrave which I believe is  covered by her insurance.  Return in about 6 weeks (around 03/18/2022) for weight management.   I spent 25 minutes on the day of the encounter to include pre-visit record review, face-to-face time with the patient and post visit ordering of test.  Beatrice Lecher, MD

## 2022-02-04 NOTE — Assessment & Plan Note (Signed)
We should be getting forms from her work.  I recommend that she be able to continue to work from home for now.  Will continue to work on addressing her anxiety.  She normally is more extroverted and really a people person but she is really been struggling since having some traumatic health events.

## 2022-02-04 NOTE — Assessment & Plan Note (Signed)
Visit #: 2 Starting Weight: 223 lbs    Current weight: 221 lbs Previous weight:223 lbs Change in weight: down 2 lbs  Goal weight: 150 lbs  Dietary goals: continue to avoid soda and work on inc protein and veggies intake.  Exercise goals:Stay active, start doing some band exercises for strengthening. Medication: Start Wegovy 0.25mg  Follow-up and referrals: 8 weeks   And to consider Contrave if we cannot get the [facility].

## 2022-02-04 NOTE — Progress Notes (Signed)
Pt stated that she hasn't been able to fill the [facility]. She has called around to multiple pharmacies and none of them have the medication in stock.

## 2022-02-26 ENCOUNTER — Ambulatory Visit: Payer: BC Managed Care – PPO | Admitting: Family Medicine

## 2022-02-26 ENCOUNTER — Encounter: Payer: Self-pay | Admitting: Family Medicine

## 2022-02-26 VITALS — BP 132/75 | HR 71

## 2022-02-26 DIAGNOSIS — Z713 Dietary counseling and surveillance: Secondary | ICD-10-CM | POA: Diagnosis not present

## 2022-02-26 DIAGNOSIS — R8290 Unspecified abnormal findings in urine: Secondary | ICD-10-CM

## 2022-02-26 DIAGNOSIS — Z7689 Persons encountering health services in other specified circumstances: Secondary | ICD-10-CM

## 2022-02-26 LAB — POCT URINALYSIS DIP (CLINITEK)
Bilirubin, UA: NEGATIVE
Glucose, UA: NEGATIVE mg/dL
Ketones, POC UA: NEGATIVE mg/dL
Leukocytes, UA: NEGATIVE
Nitrite, UA: NEGATIVE
POC PROTEIN,UA: NEGATIVE
Spec Grav, UA: >=1.03 — AB (ref 1.010–1.025)
Urobilinogen, UA: 0.2 E.U./dL
pH, UA: 5.5 (ref 5.0–8.0)

## 2022-02-26 MED ORDER — WEGOVY 0.25 MG/0.5ML ~~LOC~~ SOAJ: 0.2500 mg | SUBCUTANEOUS | 0 refills | Status: DC

## 2022-02-26 NOTE — Progress Notes (Signed)
Acute Office Visit  Subjective:     Patient ID: Kristen Spence, female    DOB: 17-Jun-1960, 62 y.o.   MRN: [id number]  No chief complaint on file.   HPI Patient is in today for odor to her urine she is very worried about getting another UTI she noticed that her start about 6 days ago she has had a little bit of low back discomfort with it no pelvic pain no hematuria no fever.  Follow-up weight management-she still having difficulty getting the [facility] but found a pharmacy that said they might get it in this week so she wants to see if she can get a printed prescription [facility]  Also her paperwork for work to be able to work from home is coming up this month and is due.  She did try to go back to work for couple days in December and started to experience panic attack feelings she is just been very fearful about being around large numbers of people and getting sick again and ending up hospitalized.  She is also noticed since then that she has had more difficult time with her memory.  ROS      Objective:    BP (!) 142/78   Pulse 72   SpO2 93%    Physical Exam Vitals reviewed.  Constitutional:      Appearance: She is well-developed.  HENT:     Head: Normocephalic and atraumatic.  Eyes:     Conjunctiva/sclera: Conjunctivae normal.  Cardiovascular:     Rate and Rhythm: Normal rate.  Pulmonary:     Effort: Pulmonary effort is normal.  Skin:    General: Skin is dry.     Coloration: Skin is not pale.  Neurological:     Mental Status: She is alert and oriented to person, place, and time.  Psychiatric:        Behavior: Behavior normal.     Results for orders placed or performed in visit on 02/26/22  POCT URINALYSIS DIP (CLINITEK)  Result Value Ref Range   Color, UA yellow yellow   Clarity, UA clear clear   Glucose, UA negative negative mg/dL   Bilirubin, UA negative negative   Ketones, POC UA negative negative mg/dL   Spec Grav, UA >=1.030  (A) 1.010 - 1.025   Blood, UA small (A) negative   pH, UA 5.5 5.0 - 8.0   POC PROTEIN,UA negative negative, trace   Urobilinogen, UA 0.2 0.2 or 1.0 E.U./dL   Nitrite, UA Negative Negative   Leukocytes, UA Negative Negative        Assessment & Plan:   Problem List Items Addressed This Visit       Other   Encounter for weight management - Primary   Relevant Medications   Semaglutide-Weight Management (WEGOVY) 0.25 MG/0.5ML SOAJ   Other Visit Diagnoses     Abnormal urine odor       Relevant Orders   POCT URINALYSIS DIP (CLINITEK) (Completed)      Abnormal urine-urinalysis is negative for sign of acute infection there is just a small amount of trace blood.  Will send for culture for confirmation.  Will call with results once available.  Did get a printed prescription for Wegovy so that she can take it to the local pharmacy in case they do get in a new shipment this week.  If not we can still consider switching to Contrave.  Will look out for paperwork to complete so that  she can continue to work from home due to her anxiety.   Meds ordered this encounter  Medications   Semaglutide-Weight Management (WEGOVY) 0.25 MG/0.5ML SOAJ    Sig: Inject 0.25 mg into the skin every 7 (seven) days.    Dispense:  2 mL    Refill:  0    Return if symptoms worsen or fail to improve.  Beatrice Lecher, MD

## 2022-02-26 NOTE — Addendum Note (Signed)
Addended by: Teddy Spike on: 02/26/2022 11:23 AM   Modules accepted: Orders

## 2022-02-27 LAB — URINE CULTURE
MICRO NUMBER:: 14538951
SPECIMEN QUALITY:: ADEQUATE

## 2022-03-02 NOTE — Progress Notes (Signed)
Hi Kristen Spence, urine culture is negative no sign of UTI.  If you are still having symptoms then please let us know.

## 2022-03-03 ENCOUNTER — Other Ambulatory Visit: Payer: Self-pay | Admitting: Family Medicine

## 2022-03-03 ENCOUNTER — Ambulatory Visit (INDEPENDENT_AMBULATORY_CARE_PROVIDER_SITE_OTHER): Payer: BC Managed Care – PPO | Admitting: Professional

## 2022-03-03 ENCOUNTER — Encounter: Payer: Self-pay | Admitting: Professional

## 2022-03-03 DIAGNOSIS — F4322 Adjustment disorder with anxiety: Secondary | ICD-10-CM | POA: Diagnosis not present

## 2022-03-03 DIAGNOSIS — F321 Major depressive disorder, single episode, moderate: Secondary | ICD-10-CM

## 2022-03-03 DIAGNOSIS — F4321 Adjustment disorder with depressed mood: Secondary | ICD-10-CM

## 2022-03-03 DIAGNOSIS — B372 Candidiasis of skin and nail: Secondary | ICD-10-CM
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
## 2022-03-04 NOTE — Telephone Encounter (Signed)
Last written 11/13/2021 #90 with no refills Last appt 02/26/2021

## 2022-03-05 ENCOUNTER — Other Ambulatory Visit: Payer: Self-pay | Admitting: Family Medicine

## 2022-03-05 DIAGNOSIS — F4321 Adjustment disorder with depressed mood: Secondary | ICD-10-CM

## 2022-03-05 DIAGNOSIS — K2100 Gastro-esophageal reflux disease with esophagitis, without bleeding: Secondary | ICD-10-CM

## 2022-03-05 MED ORDER — OMEPRAZOLE 20 MG PO CPDR: 20.0000 mg | DELAYED_RELEASE_CAPSULE | Freq: Every day | ORAL | 0 refills | Status: DC

## 2022-03-06 ENCOUNTER — Encounter: Payer: Self-pay | Admitting: Family Medicine

## 2022-03-09 ENCOUNTER — Encounter: Payer: Self-pay | Admitting: Family Medicine

## 2022-03-09 NOTE — Telephone Encounter (Signed)
Hi Angela, I am so sorry but I cannot find her forms.  I think she had originally emailed them to [place] directly because she was having a hard time getting them to attach to my chart.  But I know Kenney Houseman is out today if there is any way that she could get those to [place] today I will promise I will get them filled out by tomorrow morning.  I just do not know where they are.

## 2022-03-17 DIAGNOSIS — N200 Calculus of kidney: Secondary | ICD-10-CM | POA: Diagnosis not present

## 2022-03-25 NOTE — Telephone Encounter (Signed)
Printed original form and added correction.  Fax placed in [place]ke to be re-sent.

## 2022-03-26 NOTE — Telephone Encounter (Signed)
Faxed form.

## 2022-04-01 ENCOUNTER — Ambulatory Visit: Payer: BC Managed Care – PPO | Admitting: Professional

## 2022-05-31 ENCOUNTER — Other Ambulatory Visit: Payer: Self-pay | Admitting: Family Medicine

## 2022-05-31 DIAGNOSIS — K2100 Gastro-esophageal reflux disease with esophagitis, without bleeding: Secondary | ICD-10-CM

## 2022-06-05 ENCOUNTER — Other Ambulatory Visit: Payer: Self-pay | Admitting: Family Medicine

## 2022-06-05 DIAGNOSIS — F4321 Adjustment disorder with depressed mood: Secondary | ICD-10-CM

## 2022-06-08 ENCOUNTER — Ambulatory Visit: Payer: BC Managed Care – PPO | Admitting: Medical-Surgical

## 2022-06-08 ENCOUNTER — Encounter: Payer: Self-pay | Admitting: Medical-Surgical

## 2022-06-08 ENCOUNTER — Other Ambulatory Visit: Payer: Self-pay | Admitting: Family Medicine

## 2022-06-08 VITALS — BP 131/74 | HR 72 | Resp 20 | Ht 62.0 in | Wt 225.4 lb

## 2022-06-08 DIAGNOSIS — M5416 Radiculopathy, lumbar region: Secondary | ICD-10-CM | POA: Diagnosis not present

## 2022-06-08 DIAGNOSIS — K047 Periapical abscess without sinus: Secondary | ICD-10-CM

## 2022-06-08 MED ORDER — PREDNISONE 50 MG PO TABS: 50.0000 mg | ORAL_TABLET | Freq: Every day | ORAL | 0 refills | Status: DC

## 2022-06-08 MED ORDER — AMOXICILLIN-POT CLAVULANATE 875-125 MG PO TABS: 1.0000 | ORAL_TABLET | Freq: Two times a day (BID) | ORAL | 0 refills | Status: DC

## 2022-06-08 NOTE — Progress Notes (Signed)
        Established patient visit  History, exam, impression, and plan:  1. Lumbar radiculopathy Pleasant 62 year old female presenting today with reports of left lower leg pain along the lateral calf extending down into the left ankle and the fourth and fifth toes.  This started a couple of weeks ago without provocation but over the past weekend, got much worse.  While sitting and relaxing, she has no discomfort but when she stands, especially for long periods of time, the pain starts.  Has had some mild swelling of that leg but no erythema or excessive warmth.  Known history of left knee problems but this feels different than her previous symptoms.  On evaluation, imaging approximately 4 years showed disc space narrowing and facet arthropathy at L5-S1.  On exam, mild nonpitting edema on the left side from the knee down.  Neurovascularly intact with good pulses and normal skin temperature/color.  Small area of point tenderness on the left posterior lateral calf but otherwise negative exam.  Negative anterior drawer, posterior drawer.  No pain reproducible with varus/valgus maneuvers.  Given the location of symptoms, strongly suspect this is related to known L5-S1 concerns.  Offered updating x-ray today however she would like to proceed with a prednisone burst to see if this provides any relief.  Prednisone 50 mg daily x 5 days sent to pharmacy.  2. Tooth infection Has a cracked tooth on the right upper jaw that she needs dental care for.  Has been unable to get in with her dentist to date and reports it is going to be at least 2 weeks if not longer before she gets an appointment.  Has started to notice a foul taste in her mouth and feels that the tooth is getting infected with some significant pain over the last 4 days.  Treating with Augmentin twice daily x 7 days and recommended contacting her dentist regarding a sooner appointment given the concern for infection.  Procedures performed this  visit: None.  Return if symptoms worsen or fail to improve.  __________________________________ Thayer Ohm, DNP, APRN, FNP-BC Primary Care and Sports Medicine [facility] [place]

## 2022-07-21 ENCOUNTER — Telehealth: Payer: Self-pay | Admitting: Family Medicine

## 2022-07-21 DIAGNOSIS — N83202 Unspecified ovarian cyst, left side: Secondary | ICD-10-CM | POA: Diagnosis not present

## 2022-07-21 DIAGNOSIS — R319 Hematuria, unspecified: Secondary | ICD-10-CM | POA: Diagnosis not present

## 2022-07-21 DIAGNOSIS — R1032 Left lower quadrant pain: Secondary | ICD-10-CM | POA: Diagnosis not present

## 2022-07-21 DIAGNOSIS — N200 Calculus of kidney: Secondary | ICD-10-CM | POA: Diagnosis not present

## 2022-07-21 DIAGNOSIS — I10 Essential (primary) hypertension: Secondary | ICD-10-CM | POA: Diagnosis not present

## 2022-07-21 DIAGNOSIS — N1330 Unspecified hydronephrosis: Secondary | ICD-10-CM | POA: Diagnosis not present

## 2022-07-21 DIAGNOSIS — Z9049 Acquired absence of other specified parts of digestive tract: Secondary | ICD-10-CM | POA: Diagnosis not present

## 2022-07-21 DIAGNOSIS — K760 Fatty (change of) liver, not elsewhere classified: Secondary | ICD-10-CM | POA: Diagnosis not present

## 2022-07-21 DIAGNOSIS — Z79899 Other long term (current) drug therapy: Secondary | ICD-10-CM | POA: Diagnosis not present

## 2022-07-21 DIAGNOSIS — R112 Nausea with vomiting, unspecified: Secondary | ICD-10-CM | POA: Diagnosis not present

## 2022-07-21 DIAGNOSIS — I517 Cardiomegaly: Secondary | ICD-10-CM | POA: Diagnosis not present

## 2022-07-21 DIAGNOSIS — Z888 Allergy status to other drugs, medicaments and biological substances status: Secondary | ICD-10-CM | POA: Diagnosis not present

## 2022-07-21 DIAGNOSIS — N132 Hydronephrosis with renal and ureteral calculous obstruction: Secondary | ICD-10-CM | POA: Diagnosis not present

## 2022-07-24 NOTE — Telephone Encounter (Signed)
Made in error

## 2022-08-06 DIAGNOSIS — N200 Calculus of kidney: Secondary | ICD-10-CM | POA: Diagnosis not present

## 2022-08-06 DIAGNOSIS — N1330 Unspecified hydronephrosis: Secondary | ICD-10-CM | POA: Diagnosis not present

## 2022-08-06 DIAGNOSIS — Z9049 Acquired absence of other specified parts of digestive tract: Secondary | ICD-10-CM | POA: Diagnosis not present

## 2022-08-06 DIAGNOSIS — I10 Essential (primary) hypertension: Secondary | ICD-10-CM | POA: Diagnosis not present

## 2022-08-18 DIAGNOSIS — N1330 Unspecified hydronephrosis: Secondary | ICD-10-CM | POA: Diagnosis not present

## 2022-08-18 DIAGNOSIS — N200 Calculus of kidney: Secondary | ICD-10-CM | POA: Diagnosis not present

## 2022-08-20 DIAGNOSIS — N200 Calculus of kidney: Secondary | ICD-10-CM | POA: Diagnosis not present

## 2022-10-12 ENCOUNTER — Other Ambulatory Visit: Payer: Self-pay | Admitting: Family Medicine

## 2022-10-12 DIAGNOSIS — F4321 Adjustment disorder with depressed mood: Secondary | ICD-10-CM

## 2022-11-10 ENCOUNTER — Encounter: Payer: Self-pay | Admitting: Family Medicine

## 2022-11-10 ENCOUNTER — Other Ambulatory Visit: Payer: Self-pay | Admitting: Family Medicine

## 2022-11-12 MED ORDER — FLUCONAZOLE 150 MG PO TABS: 150.0000 mg | ORAL_TABLET | Freq: Once | ORAL | 0 refills | Status: AC

## 2022-12-05 ENCOUNTER — Other Ambulatory Visit: Payer: Self-pay | Admitting: Family Medicine

## 2022-12-05 DIAGNOSIS — K2100 Gastro-esophageal reflux disease with esophagitis, without bleeding: Secondary | ICD-10-CM

## 2023-01-04 ENCOUNTER — Encounter: Payer: Self-pay | Admitting: Family Medicine

## 2023-01-04 ENCOUNTER — Ambulatory Visit (INDEPENDENT_AMBULATORY_CARE_PROVIDER_SITE_OTHER): Payer: BC Managed Care – PPO | Admitting: Family Medicine

## 2023-01-04 VITALS — BP 139/77 | HR 64 | Ht 61.0 in | Wt 227.0 lb

## 2023-01-04 DIAGNOSIS — Z0000 Encounter for general adult medical examination without abnormal findings: Secondary | ICD-10-CM | POA: Diagnosis not present

## 2023-01-04 DIAGNOSIS — Z1231 Encounter for screening mammogram for malignant neoplasm of breast: Secondary | ICD-10-CM

## 2023-01-04 DIAGNOSIS — J22 Unspecified acute lower respiratory infection: Secondary | ICD-10-CM | POA: Diagnosis not present

## 2023-01-04 DIAGNOSIS — F4321 Adjustment disorder with depressed mood: Secondary | ICD-10-CM | POA: Diagnosis not present

## 2023-01-04 DIAGNOSIS — I10 Essential (primary) hypertension: Secondary | ICD-10-CM

## 2023-01-04 MED ORDER — AMLODIPINE BESYLATE 5 MG PO TABS: 5.0000 mg | ORAL_TABLET | Freq: Every day | ORAL | 3 refills | Status: DC

## 2023-01-04 MED ORDER — MELOXICAM 15 MG PO TABS: 15.0000 mg | ORAL_TABLET | Freq: Every day | ORAL | 1 refills | Status: DC | PRN

## 2023-01-04 MED ORDER — ALBUTEROL SULFATE HFA 108 (90 BASE) MCG/ACT IN AERS
2.0000 | INHALATION_SPRAY | Freq: Four times a day (QID) | RESPIRATORY_TRACT | 1 refills | Status: DC | PRN
Start: 2023-01-04 — End: 2023-03-08

## 2023-01-04 MED ORDER — LISINOPRIL-HYDROCHLOROTHIAZIDE 20-25 MG PO TABS: 1.0000 | ORAL_TABLET | Freq: Every day | ORAL | 3 refills | Status: DC

## 2023-01-04 NOTE — Progress Notes (Signed)
Complete physical exam  Patient: Kristen Spence   DOB: 03/09/1960   62 y.o. Female  MRN: [id number]  Subjective:    Chief Complaint  Patient presents with   Annual Exam    Kristen Spence is a 62 y.o. female who presents today for a complete physical exam. She reports consuming a general diet. The patient does not participate in regular exercise at present. She generally feels fairly well. She reports sleeping fairly well. She does not have additional problems to discuss today.   Let me know that she has been dealing with kidney stones again.  She was able to pass 1 recently she has a prescription for Flomax to take if she feels like she needs to pass 1.   Most recent fall risk assessment:    02/04/2022    8:15 AM  Fall Risk   Falls in the past year? 0  Number falls in past yr: 0  Injury with Fall? 0  Risk for fall due to : No Fall Risks  Follow up Falls evaluation completed     Most recent depression screenings:    12/16/2021    2:39 PM 12/09/2021   11:50 AM  PHQ 2/9 Scores  PHQ - 2 Score 2 2  PHQ- 9 Score 7 6         Patient Care Team: Agapito Games, MD as PCP - General   Outpatient Medications Prior to Visit  Medication Sig   buPROPion (WELLBUTRIN XL) 150 MG 24 hr tablet TAKE 1 TABLET BY MOUTH EVERY DAY   cholecalciferol (VITAMIN D) 1000 units tablet Take 2,000 Units by mouth daily.   [facility] ASPIRIN ADULT LOW DOSE 81 MG chewable tablet Chew 1 tablet (81 mg total) by mouth daily.   DULoxetine (CYMBALTA) 60 MG capsule TAKE 2 CAPSULES BY MOUTH EVERY DAY   furosemide (LASIX) 20 MG tablet TAKE 1 TABLET BY MOUTH EVERY DAY AS NEEDED   hydroxychloroquine (PLAQUENIL) 200 MG tablet Take 200 mg by mouth 2 (two) times daily.   hydrOXYzine (VISTARIL) 25 MG capsule Take 1 capsule (25 mg total) by mouth 3 (three) times daily as needed for itching.   omeprazole (PRILOSEC) 20 MG capsule TAKE 1 CAPSULE BY MOUTH EVERY DAY   rizatriptan (MAXALT) 10 MG tablet Take 1  tablet (10 mg total) by mouth as needed. May repeat in 2 hours if needed   rosuvastatin (CRESTOR) 10 MG tablet Take 10 mg by mouth at bedtime.   tacrolimus (PROTOPIC) 0.1 % ointment Apply 1 application topically 2 (two) times daily.   tamsulosin (FLOMAX) 0.4 MG CAPS capsule Take 0.4 mg by mouth daily.   topiramate (TOPAMAX) 25 MG tablet Take 25 mg by mouth 2 (two) times daily.   valACYclovir (VALTREX) 1000 MG tablet Take 1 tablet (1,000 mg total) by mouth 2 (two) times daily. When needed for outbreatk   [DISCONTINUED] albuterol (VENTOLIN HFA) 108 (90 Base) MCG/ACT inhaler Inhale 2 puffs into the lungs every 6 (six) hours as needed for wheezing or shortness of breath.   [DISCONTINUED] amLODipine (NORVASC) 10 MG tablet Take 0.5 tablets (5 mg total) by mouth daily.   [DISCONTINUED] diclofenac Sodium (VOLTAREN) 1 % GEL APPLY 4 G TOPICALLY 4 TIMES A DAY   [DISCONTINUED] ipratropium (ATROVENT) 0.03 % nasal spray PLACE 2 SPRAYS INTO BOTH NOSTRILS EVERY 12 (TWELVE) HOURS.   [DISCONTINUED] lisinopril-hydrochlorothiazide (ZESTORETIC) 20-25 MG tablet TAKE 1 TABLET BY MOUTH EVERY DAY   [DISCONTINUED] meloxicam (MOBIC) 15 MG tablet TAKE 1  TABLET BY MOUTH DAILY AS NEEDED.   [DISCONTINUED] montelukast (SINGULAIR) 10 MG tablet TAKE 1 TABLET BY MOUTH EVERYDAY AT BEDTIME   [DISCONTINUED] nystatin (MYCOSTATIN/NYSTOP) powder APPLY TO AFFECTED AREA TWICE A DAY   [DISCONTINUED] Semaglutide-Weight Management (WEGOVY) 0.25 MG/0.5ML SOAJ Inject 0.25 mg into the skin every 7 (seven) days.   [DISCONTINUED] ACCU-CHEK GUIDE test strip USE TO CHECK BLOOD SUGAR ONCE A DAY   [DISCONTINUED] Accu-Chek Softclix Lancets lancets USE TO TEST SUGAR ONCE A DAY   [DISCONTINUED] ALPRAZolam (XANAX) 0.25 MG tablet TAKE 1 TABLET BY MOUTH DAILY AS NEEDED FOR ANXIETY   [DISCONTINUED] amoxicillin-clavulanate (AUGMENTIN) 875-125 MG tablet Take 1 tablet by mouth 2 (two) times daily.   [DISCONTINUED] blood glucose meter kit and supplies Dispense  based on patient and insurance preference. Check blood sugar once as directed. R73.03   [DISCONTINUED] predniSONE (DELTASONE) 50 MG tablet Take 1 tablet (50 mg total) by mouth daily.   No facility-administered medications prior to visit.    ROS        Objective:     BP 139/77   Pulse 64   Ht 5\' 1"  (1.549 m)   Wt 227 lb (103 kg)   SpO2 100%   BMI 42.89 kg/m     Physical Exam Constitutional:      Appearance: Normal appearance.  HENT:     Head: Normocephalic and atraumatic.     Right Ear: Tympanic membrane, ear canal and external ear normal.     Left Ear: Tympanic membrane, ear canal and external ear normal.     Nose: Nose normal.     Mouth/Throat:     Pharynx: Oropharynx is clear.  Eyes:     Extraocular Movements: Extraocular movements intact.     Conjunctiva/sclera: Conjunctivae normal.     Pupils: Pupils are equal, round, and reactive to light.  Neck:     Thyroid: No thyromegaly.  Cardiovascular:     Rate and Rhythm: Normal rate and regular rhythm.  Pulmonary:     Effort: Pulmonary effort is normal.     Breath sounds: Normal breath sounds.  Abdominal:     General: Bowel sounds are normal.     Palpations: Abdomen is soft.     Tenderness: There is no abdominal tenderness.  Musculoskeletal:        General: No swelling.     Cervical back: Neck supple.  Skin:    General: Skin is warm and dry.  Neurological:     Mental Status: She is oriented to person, place, and time.  Psychiatric:        Mood and Affect: Mood normal.        Behavior: Behavior normal.      Results for orders placed or performed in visit on 01/04/23  CMP14+EGFR  Result Value Ref Range   Glucose 102 (H) 70 - 99 mg/dL   BUN 14 8 - 27 mg/dL   Creatinine, Ser 9.52 (H) 0.57 - 1.00 mg/dL   eGFR 60 >84 XL/KGM/0.10   BUN/Creatinine Ratio 13 12 - 28   Sodium 140 134 - 144 mmol/L   Potassium 4.2 3.5 - 5.2 mmol/L   Chloride 102 96 - 106 mmol/L   CO2 21 20 - 29 mmol/L   Calcium 9.6 8.7 - 10.3  mg/dL   Total Protein 6.7 6.0 - 8.5 g/dL   Albumin 4.1 3.9 - 4.9 g/dL   Globulin, Total 2.6 1.5 - 4.5 g/dL   Bilirubin Total 0.5 0.0 - 1.2 mg/dL  Alkaline Phosphatase 84 44 - 121 IU/L   AST 41 (H) 0 - 40 IU/L   ALT 49 (H) 0 - 32 IU/L  Lipid panel  Result Value Ref Range   Cholesterol, Total 173 100 - 199 mg/dL   Triglycerides 132 0 - 149 mg/dL   HDL 44 >44 mg/dL   VLDL Cholesterol Cal 21 5 - 40 mg/dL   LDL Chol Calc (NIH) 010 (H) 0 - 99 mg/dL   Chol/HDL Ratio 3.9 0.0 - 4.4 ratio  CBC  Result Value Ref Range   WBC 8.1 3.4 - 10.8 x10E3/uL   RBC 4.75 3.77 - 5.28 x10E6/uL   Hemoglobin 14.2 11.1 - 15.9 g/dL   Hematocrit 27.2 53.6 - 46.6 %   MCV 91 79 - 97 fL   MCH 29.9 26.6 - 33.0 pg   MCHC 32.7 31.5 - 35.7 g/dL   RDW 64.4 03.4 - 74.2 %   Platelets 347 150 - 450 x10E3/uL       Assessment & Plan:    Routine Health Maintenance and Physical Exam  Immunization History  Administered Date(s) Administered   Influenza Inj Mdck Quad Pf 12/06/2020   Influenza-Unspecified 10/20/2019   PFIZER(Purple Top)SARS-COV-2 Vaccination 04/14/2019, 05/04/2019   Tdap 01/19/2018   Zoster Recombinant(Shingrix) 04/03/2019, 10/29/2019    Health Maintenance  Topic Date Due   Colonoscopy  02/20/2015   COVID-19 Vaccine (3 - 2024-25 season) 09/20/2022   MAMMOGRAM  11/08/2022   INFLUENZA VACCINE  04/19/2023 (Originally 08/20/2022)   Hepatitis C Screening  04/12/2028 (Originally 12/16/1979)   HIV Screening  04/12/2028 (Originally 12/15/1976)   DTaP/Tdap/Td (2 - Td or Tdap) 01/20/2028   Zoster Vaccines- Shingrix  Completed   HPV VACCINES  Aged Out    Discussed health benefits of physical activity, and encouraged her to engage in regular exercise appropriate for her age and condition.  Problem List Items Addressed This Visit       Cardiovascular and Mediastinum   HYPERTENSION, BENIGN   Pressure a little borderline elevated today we will keep a close eye on it continue with amlodipine we will  make sure that there are refills.      Relevant Medications   amLODipine (NORVASC) 5 MG tablet   lisinopril-hydrochlorothiazide (ZESTORETIC) 20-25 MG tablet     Other   Complicated grief   Will refill alprazolam.  Continue to use sparingly.      Relevant Medications   ALPRAZolam (XANAX) 0.25 MG tablet   Other Visit Diagnoses       Wellness examination    -  Primary   Relevant Orders   CMP14+EGFR (Completed)   Lipid panel (Completed)   CBC (Completed)     Screening mammogram for breast cancer       Relevant Orders   MM 3D SCREENING MAMMOGRAM BILATERAL BREAST     Lower resp. tract infection       Relevant Medications   albuterol (VENTOLIN HFA) 108 (90 Base) MCG/ACT inhaler       Keep up a regular exercise program and make sure you are eating a healthy diet Try to eat 4 servings of dairy a day, or if you are lactose intolerant take a calcium with vitamin D daily.  Your vaccines are up to date.   Return in about 6 months (around 07/05/2023) for Hypertension.     Nani Gasser, MD

## 2023-01-04 NOTE — Assessment & Plan Note (Signed)
Will refill alprazolam.  Continue to use sparingly.

## 2023-01-04 NOTE — Assessment & Plan Note (Signed)
Pressure a little borderline elevated today we will keep a close eye on it continue with amlodipine we will make sure that there are refills.

## 2023-01-05 LAB — CBC
Hematocrit: 43.4 % (ref 34.0–46.6)
Hemoglobin: 14.2 g/dL (ref 11.1–15.9)
MCH: 29.9 pg (ref 26.6–33.0)
MCHC: 32.7 g/dL (ref 31.5–35.7)
MCV: 91 fL (ref 79–97)
Platelets: 347 10*3/uL (ref 150–450)
RBC: 4.75 x10E6/uL (ref 3.77–5.28)
RDW: 12.7 % (ref 11.7–15.4)
WBC: 8.1 10*3/uL (ref 3.4–10.8)

## 2023-01-05 LAB — LIPID PANEL
Chol/HDL Ratio: 3.9 {ratio} (ref 0.0–4.4)
Cholesterol, Total: 173 mg/dL (ref 100–199)
HDL: 44 mg/dL (ref >39)
LDL Chol Calc (NIH): 108 mg/dL — ABNORMAL HIGH (ref 0–99)
Triglycerides: 116 mg/dL (ref 0–149)
VLDL Cholesterol Cal: 21 mg/dL (ref 5–40)

## 2023-01-05 LAB — CMP14+EGFR
ALT: 49 [IU]/L — ABNORMAL HIGH (ref 0–32)
AST: 41 [IU]/L — ABNORMAL HIGH (ref 0–40)
Albumin: 4.1 g/dL (ref 3.9–4.9)
Alkaline Phosphatase: 84 [IU]/L (ref 44–121)
BUN/Creatinine Ratio: 13 (ref 12–28)
BUN: 14 mg/dL (ref 8–27)
Bilirubin Total: 0.5 mg/dL (ref 0.0–1.2)
CO2: 21 mmol/L (ref 20–29)
Calcium: 9.6 mg/dL (ref 8.7–10.3)
Chloride: 102 mmol/L (ref 96–106)
Creatinine, Ser: 1.06 mg/dL — ABNORMAL HIGH (ref 0.57–1.00)
Globulin, Total: 2.6 g/dL (ref 1.5–4.5)
Glucose: 102 mg/dL — ABNORMAL HIGH (ref 70–99)
Potassium: 4.2 mmol/L (ref 3.5–5.2)
Sodium: 140 mmol/L (ref 134–144)
Total Protein: 6.7 g/dL (ref 6.0–8.5)
eGFR: 60 mL/min/{1.73_m2} (ref >59)

## 2023-01-05 MED ORDER — ALPRAZOLAM 0.25 MG PO TABS
0.2500 mg | ORAL_TABLET | Freq: Every day | ORAL | 0 refills | Status: DC | PRN
Start: 2023-01-05 — End: 2023-04-02

## 2023-01-05 NOTE — Progress Notes (Signed)
Hi Kristen Spence, kidney functions pretty stable here usually between 0.9 and 1.0.  Your liver enzymes are back up a little bit.  Just really want you to work on cutting back on any excess Tylenol products and working on improving diet try to eat less processed or packaged foods this can cause a lot of inflammation in the liver would like to recheck it again in about 1 to 2 months.  LDL cholesterol is just mildly elevated but your triglycerides look good.  Blood count is normal.  I did order your mammogram so feel free to reach out to them to get scheduled.  You can also schedule over your MyChart it will actually let you pick out a mammogram appointment time.  The 10-year ASCVD risk score (Arnett DK, et al., 2019) is: 6%   Values used to calculate the score:     Age: 62 years     Sex: Female     Is Non-Hispanic African American: No     Diabetic: No     Tobacco smoker: No     Systolic Blood Pressure: 139 mmHg     Is BP treated: Yes     HDL Cholesterol: 44 mg/dL     Total Cholesterol: 173 mg/dL

## 2023-02-14 ENCOUNTER — Other Ambulatory Visit: Payer: Self-pay | Admitting: Family Medicine

## 2023-02-14 DIAGNOSIS — F4321 Adjustment disorder with depressed mood: Secondary | ICD-10-CM

## 2023-02-20 ENCOUNTER — Other Ambulatory Visit: Payer: Self-pay

## 2023-02-20 ENCOUNTER — Ambulatory Visit
Admission: EM | Admit: 2023-02-20 | Discharge: 2023-02-20 | Disposition: A | Discharge status: Home / Self Care | Payer: BC Managed Care – PPO | Attending: Nurse Practitioner | Admitting: Nurse Practitioner

## 2023-02-20 DIAGNOSIS — R399 Unspecified symptoms and signs involving the genitourinary system: Secondary | ICD-10-CM

## 2023-02-20 DIAGNOSIS — N390 Urinary tract infection, site not specified: Secondary | ICD-10-CM | POA: Insufficient documentation

## 2023-02-20 LAB — POCT URINALYSIS DIP (MANUAL ENTRY)
Bilirubin, UA: NEGATIVE
Glucose, UA: NEGATIVE mg/dL
Ketones, POC UA: NEGATIVE mg/dL
Leukocytes, UA: NEGATIVE
Nitrite, UA: NEGATIVE
Protein Ur, POC: NEGATIVE mg/dL
Spec Grav, UA: <=1.005 — AB (ref 1.010–1.025)
Urobilinogen, UA: 0.2 U/dL
pH, UA: 6 (ref 5.0–8.0)

## 2023-02-20 MED ORDER — CEPHALEXIN 500 MG PO CAPS: 500.0000 mg | ORAL_CAPSULE | Freq: Two times a day (BID) | ORAL | 0 refills | Status: DC

## 2023-02-20 NOTE — ED Provider Notes (Signed)
[facility]    CSN: [id number] Arrival date & time: 02/20/23  1526      History   Chief Complaint No chief complaint on file.   HPI Kristen Spence is a 63 y.o. female.   Patient presents today for 1 day history of pressure and pain with urination, increased urinary frequency and urgency, low back pain, and suprapubic pain/pressure.  She denies new urinary incontinence, hematuria, abdominal pain, flank pain, fever, nausea/vomiting.  No vaginal discharge.  She reports history of UTIs in the past, last year had "urosepsis from E. coli."  Has not taken anything for symptoms so far.  Drinks quite a bit of water at baseline to prevent UTIs.  At the end of the visit, the patient states her ears are crackling and she wants me to check them today.  Denies drainage from the ears or decreased hearing.    Past Medical History:  Diagnosis Date   Benign positional vertigo    Dizziness    Edema    Fatigue    GERD (gastroesophageal reflux disease)    hiatal hernia   Heart palpitations    Hiatal hernia    Hypertension    Insomnia    Mental disorder    Migraine     Patient Active Problem List   Diagnosis Date Noted   Uric acid nephrolithiasis 12/09/2021   AKI (acute kidney injury) (HCC) 10/23/2021   Left foot pain 03/21/2021   Left lateral ankle pain 03/21/2021   Encounter for weight management 02/18/2021   LVH (left ventricular hypertrophy) 02/18/2021   Abnormal glucose 01/21/2021   Hepatic steatosis 01/10/2021   PTSD (post-traumatic stress disorder) 03/01/2020   Complicated grief 11/16/2019   Left leg pain 10/05/2019   Seasonal allergic rhinitis due to pollen 04/13/2019   Oral herpes 09/12/2018   Gastroesophageal reflux disease 03/14/2018   Family history of ovarian cancer 11/20/2015   Seasonal allergies 03/11/2015   Allergic conjunctivitis and rhinitis 03/04/2015   Morbid obesity (HCC) 01/08/2014   Panic disorder with agoraphobia 12/23/2012   Generalized  anxiety disorder 12/13/2012   Adjustment disorder with anxiety 12/09/2012   Major depression, single episode 12/09/2012   Depression 06/12/2010   BENIGN POSITIONAL VERTIGO 01/15/2010   HYPERTENSION, BENIGN 03/11/2009   Other insomnia 03/11/2009   Diaphragmatic hernia 04/23/2008   ANEMIA, IRON DEFICIENCY 12/09/2006   Migraine headache 12/09/2006   PALPITATIONS 12/09/2006    Past Surgical History:  Procedure Laterality Date   BREAST BIOPSY     BREAST EXCISIONAL BIOPSY Left 2000   CESAREAN SECTION  1982   CHOLECYSTECTOMY  1987   ENDOMETRIAL ABLATION  09/30/2006   ESOPHAGOGASTRODUODENOSCOPY     Showed grade D. reflux esophagitis   TOTAL VAGINAL HYSTERECTOMY  09/27/2008   Dr. Lavon Paganini    OB History     Gravida  4   Para  2   Term  2   Preterm      AB  2   Living         SAB  2   IAB      Ectopic      Multiple      Live Births  2            Home Medications    Prior to Admission medications   Medication Sig Start Date End Date Taking? Authorizing Provider  cephALEXin (KEFLEX) 500 MG capsule Take 1 capsule (500 mg total) by mouth 2 (two) times daily for 5 days. 02/20/23  02/25/23 Yes Valentino Nose, NP  albuterol (VENTOLIN HFA) 108 (90 Base) MCG/ACT inhaler Inhale 2 puffs into the lungs every 6 (six) hours as needed for wheezing or shortness of breath. 01/04/23   Agapito Games, MD  ALPRAZolam Prudy Feeler) 0.25 MG tablet Take 1 tablet (0.25 mg total) by mouth daily as needed. for anxiety 01/05/23   Agapito Games, MD  amLODipine (NORVASC) 5 MG tablet Take 1 tablet (5 mg total) by mouth daily. 01/04/23   Agapito Games, MD  buPROPion (WELLBUTRIN XL) 150 MG 24 hr tablet TAKE 1 TABLET BY MOUTH EVERY DAY 06/08/22   Agapito Games, MD  cholecalciferol (VITAMIN D) 1000 units tablet Take 2,000 Units by mouth daily.    [provider]  [facility] ASPIRIN ADULT LOW DOSE 81 MG chewable tablet Chew 1 tablet (81 mg total) by mouth daily.  12/09/21   Agapito Games, MD  DULoxetine (CYMBALTA) 60 MG capsule Take 2 capsules (120 mg total) by mouth daily. 02/15/23   Agapito Games, MD  furosemide (LASIX) 20 MG tablet TAKE 1 TABLET BY MOUTH EVERY DAY AS NEEDED 12/22/21   Agapito Games, MD  hydroxychloroquine (PLAQUENIL) 200 MG tablet Take 200 mg by mouth 2 (two) times daily. 12/28/20   [provider]  hydrOXYzine (VISTARIL) 25 MG capsule Take 1 capsule (25 mg total) by mouth 3 (three) times daily as needed for itching. 10/23/21   Breeback, Jade L, PA-C  lisinopril-hydrochlorothiazide (ZESTORETIC) 20-25 MG tablet Take 1 tablet by mouth daily. 01/04/23   Agapito Games, MD  meloxicam (MOBIC) 15 MG tablet Take 1 tablet (15 mg total) by mouth daily as needed. 01/04/23   Agapito Games, MD  omeprazole (PRILOSEC) 20 MG capsule TAKE 1 CAPSULE BY MOUTH EVERY DAY 12/07/22   Agapito Games, MD  rizatriptan (MAXALT) 10 MG tablet Take 1 tablet (10 mg total) by mouth as needed. May repeat in 2 hours if needed 01/15/21   Agapito Games, MD  rosuvastatin (CRESTOR) 10 MG tablet Take 10 mg by mouth at bedtime. 10/12/21   [provider]  tacrolimus (PROTOPIC) 0.1 % ointment Apply 1 application topically 2 (two) times daily. 12/19/20   [provider]  tamsulosin (FLOMAX) 0.4 MG CAPS capsule Take 0.4 mg by mouth daily.    [provider]  topiramate (TOPAMAX) 25 MG tablet Take 25 mg by mouth 2 (two) times daily. 01/17/21   [provider]  valACYclovir (VALTREX) 1000 MG tablet Take 1 tablet (1,000 mg total) by mouth 2 (two) times daily. When needed for outbreatk 01/04/20   Early, Sung Amabile, NP    Family History Family History  Problem Relation Age of Onset   Alcohol abuse Father    Stroke Father    Hypertension Father    Ovarian cancer Mother    Depression Mother    Diabetes Mother    Hyperlipidemia Mother    Hypertension Mother    COPD Mother    Heart attack  Sister 54   Breast cancer Sister     Social History Social History   Tobacco Use   Smoking status: Never   Smokeless tobacco: Never  Substance Use Topics   Alcohol use: No   Drug use: No     Allergies   Banana, Clonazepam, Flonase [fluticasone], Zolpidem, and Bactrim [sulfamethoxazole-trimethoprim]   Review of Systems Review of Systems Per HPI  Physical Exam Triage Vital Signs ED Triage Vitals  Encounter Vitals Group  BP 02/20/23 1531 (!) 142/82     Systolic BP Percentile --      Diastolic BP Percentile --      Pulse Rate 02/20/23 1531 97     Resp 02/20/23 1531 16     Temp 02/20/23 1531 97.7 F (36.5 C)     Temp Source 02/20/23 1531 Oral     SpO2 02/20/23 1531 98 %     Weight --      Height --      Head Circumference --      Peak Flow --      Pain Score 02/20/23 1536 6     Pain Loc --      Pain Education --      Exclude from Growth Chart --    No data found.  Updated Vital Signs BP (!) 142/82   Pulse 97   Temp 97.7 F (36.5 C) (Oral)   Resp 16   SpO2 98%   Visual Acuity Right Eye Distance:   Left Eye Distance:   Bilateral Distance:    Right Eye Near:   Left Eye Near:    Bilateral Near:     Physical Exam Vitals and nursing note reviewed.  Constitutional:      General: She is not in acute distress.    Appearance: She is not toxic-appearing.  HENT:     Right Ear: Tympanic membrane, ear canal and external ear normal.     Left Ear: Tympanic membrane, ear canal and external ear normal.  Pulmonary:     Effort: Pulmonary effort is normal. No respiratory distress.  Abdominal:     General: Abdomen is flat. Bowel sounds are normal. There is no distension.     Palpations: Abdomen is soft. There is no mass.     Tenderness: There is no abdominal tenderness. There is no right CVA tenderness, left CVA tenderness or guarding.  Skin:    General: Skin is warm and dry.     Coloration: Skin is not jaundiced or pale.     Findings: No erythema.   Neurological:     Mental Status: She is alert and oriented to person, place, and time.     Motor: No weakness.     Gait: Gait normal.  Psychiatric:        Behavior: Behavior is cooperative.      UC Treatments / Results  Labs (all labs ordered are listed, but only abnormal results are displayed) Labs Reviewed  POCT URINALYSIS DIP (MANUAL ENTRY) - Abnormal; Notable for the following components:      Result Value   Spec Grav, UA <=1.005 (*)    Blood, UA small (*)    All other components within normal limits  URINE CULTURE    EKG   Radiology No results found.  Procedures Procedures (including critical care time)  Medications Ordered in UC Medications - No data to display  Initial Impression / Assessment and Plan / UC Course  I have reviewed the triage vital signs and the nursing notes.  Pertinent labs & imaging results that were available during my care of the patient were reviewed by me and considered in my medical decision making (see chart for details).   Patient is well-appearing, normotensive, afebrile, not tachycardic, not tachypneic, oxygenating well on room air.    1. UTI symptoms Urinalysis today shows small amount of blood, this is baseline for patient Urine is very well-hydrated Given symptoms, will send for urine culture and treat with Keflex  twice daily for 5 days Strict ER precautions discussed with patient  The patient was given the opportunity to ask questions.  All questions answered to their satisfaction.  The patient is in agreement to this plan.    Final Clinical Impressions(s) / UC Diagnoses   Final diagnoses:  UTI symptoms     Discharge Instructions      We are treating you for a UTI today with Keflex twice daily for 5 days.  We will contact you if the urine culture shows we need to change the antibiotic in a couple of days.  Continue to drink plenty of water.  If you develop fever, nausea/vomiting and are not able to keep fluids down, or  severe pain or high fever, please seek care emergently.     ED Prescriptions     Medication Sig Dispense Auth. Provider   cephALEXin (KEFLEX) 500 MG capsule Take 1 capsule (500 mg total) by mouth 2 (two) times daily for 5 days. 10 capsule Valentino Nose, NP      PDMP not reviewed this encounter.   Valentino Nose, NP 02/20/23 [phone]

## 2023-02-20 NOTE — ED Triage Notes (Addendum)
X 2 days has had urinary frequency, urgency, bladder pressure, some pain with urination, low back pain. Reports hx of urosepsis last year d/t e coli.

## 2023-02-20 NOTE — Discharge Instructions (Signed)
We are treating you for a UTI today with Keflex twice daily for 5 days.  We will contact you if the urine culture shows we need to change the antibiotic in a couple of days.  Continue to drink plenty of water.  If you develop fever, nausea/vomiting and are not able to keep fluids down, or severe pain or high fever, please seek care emergently.

## 2023-02-22 LAB — URINE CULTURE: Culture: NO GROWTH

## 2023-02-25 ENCOUNTER — Encounter: Payer: Self-pay | Admitting: Family Medicine

## 2023-02-25 ENCOUNTER — Ambulatory Visit: Payer: BC Managed Care – PPO | Admitting: Family Medicine

## 2023-02-25 VITALS — BP 125/75 | HR 73 | Wt 233.0 lb

## 2023-02-25 DIAGNOSIS — K2100 Gastro-esophageal reflux disease with esophagitis, without bleeding: Secondary | ICD-10-CM

## 2023-02-25 DIAGNOSIS — G43109 Migraine with aura, not intractable, without status migrainosus: Secondary | ICD-10-CM | POA: Diagnosis not present

## 2023-02-25 DIAGNOSIS — F321 Major depressive disorder, single episode, moderate: Secondary | ICD-10-CM

## 2023-02-25 DIAGNOSIS — I10 Essential (primary) hypertension: Secondary | ICD-10-CM | POA: Diagnosis not present

## 2023-02-25 DIAGNOSIS — F4001 Agoraphobia with panic disorder: Secondary | ICD-10-CM

## 2023-02-25 MED ORDER — MELOXICAM 15 MG PO TABS: 15.0000 mg | ORAL_TABLET | Freq: Every day | ORAL | 1 refills | Status: DC | PRN

## 2023-02-25 NOTE — Assessment & Plan Note (Signed)
 She needs updated paperwork for work she still rarely leaves the house except for maybe to go to the grocery store.  She has a significant fear and phobia of germs and getting ill ever since she was severely hospitalized.  She says sometimes even just getting out will trigger her anxiety and panic attacks.  She has a history of panic disorder with agoraphobia.

## 2023-02-25 NOTE — Assessment & Plan Note (Signed)
 Rarely leaves the house except for maybe going to the grocery store and doctors appointments.

## 2023-02-25 NOTE — Assessment & Plan Note (Addendum)
 ON Norvasc , lisinopril  hct, and prn lasix .  Pressure looks fantastic today.

## 2023-02-25 NOTE — Assessment & Plan Note (Signed)
 Overall they have been fairly manageable though she has had a few more headaches in the last couple of weeks with the weather change.  She is currently on Topamax .  Has Maxalt  as needed for rescue.

## 2023-02-25 NOTE — Progress Notes (Signed)
   Established Patient Office Visit  Subjective  Patient ID: Kristen Spence, female    DOB: [birth year]  Age: 63 y.o. MRN: [id number]  Chief Complaint  Patient presents with   Hypertension    HPI  Right upper arm pain for a couple of weeks with occ numb tingling down into forearm. Doesn't affect the hand.     ROS    Objective:     BP 125/75   Pulse 73   Wt 233 lb (105.7 kg)   SpO2 97%   BMI 44.02 kg/m    Physical Exam Vitals and nursing note reviewed.  Constitutional:      Appearance: Normal appearance.  HENT:     Head: Normocephalic and atraumatic.  Eyes:     Conjunctiva/sclera: Conjunctivae normal.  Cardiovascular:     Rate and Rhythm: Normal rate and regular rhythm.  Pulmonary:     Effort: Pulmonary effort is normal.     Breath sounds: Normal breath sounds.  Skin:    General: Skin is warm and dry.  Neurological:     Mental Status: She is alert.  Psychiatric:        Mood and Affect: Mood normal.      No results found for any visits on 02/25/23.    The 10-year ASCVD risk score (Arnett DK, et al., 2019) is: 4.8%    Assessment & Plan:   Problem List Items Addressed This Visit       Cardiovascular and Mediastinum   Migraine headache   Overall they have been fairly manageable though she has had a few more headaches in the last couple of weeks with the weather change.  She is currently on Topamax .  Has Maxalt  as needed for rescue.      Relevant Medications   meloxicam  (MOBIC ) 15 MG tablet   HYPERTENSION, BENIGN - Primary   ON Norvasc , lisinopril  hct, and prn lasix .  Pressure looks fantastic today.      Relevant Orders   CMP14+EGFR     Digestive   Gastroesophageal reflux disease   Continue omeprazole .        Other   Panic disorder with agoraphobia   Rarely leaves the house except for maybe going to the grocery store and doctors appointments.      Major depression, single episode   She needs updated paperwork for work she still  rarely leaves the house except for maybe to go to the grocery store.  She has a significant fear and phobia of germs and getting ill ever since she was severely hospitalized.  She says sometimes even just getting out will trigger her anxiety and panic attacks.  She has a history of panic disorder with agoraphobia.       Return in about 6 months (around 08/25/2023) for Hypertension.    Dorothyann Byars, MD

## 2023-02-25 NOTE — Assessment & Plan Note (Signed)
 Continue omeprazole

## 2023-02-26 ENCOUNTER — Encounter: Payer: Self-pay | Admitting: Family Medicine

## 2023-02-26 DIAGNOSIS — Z7689 Persons encountering health services in other specified circumstances: Secondary | ICD-10-CM

## 2023-02-26 LAB — CMP14+EGFR
ALT: 49 [IU]/L — ABNORMAL HIGH (ref 0–32)
AST: 40 [IU]/L (ref 0–40)
Albumin: 4.1 g/dL (ref 3.9–4.9)
Alkaline Phosphatase: 88 [IU]/L (ref 44–121)
BUN/Creatinine Ratio: 10 — ABNORMAL LOW (ref 12–28)
BUN: 11 mg/dL (ref 8–27)
Bilirubin Total: 0.4 mg/dL (ref 0.0–1.2)
CO2: 25 mmol/L (ref 20–29)
Calcium: 10.1 mg/dL (ref 8.7–10.3)
Chloride: 106 mmol/L (ref 96–106)
Creatinine, Ser: 1.11 mg/dL — ABNORMAL HIGH (ref 0.57–1.00)
Globulin, Total: 2.7 g/dL (ref 1.5–4.5)
Glucose: 110 mg/dL — ABNORMAL HIGH (ref 70–99)
Potassium: 4.5 mmol/L (ref 3.5–5.2)
Sodium: 145 mmol/L — ABNORMAL HIGH (ref 134–144)
Total Protein: 6.8 g/dL (ref 6.0–8.5)
eGFR: 57 mL/min/{1.73_m2} — ABNORMAL LOW (ref >59)

## 2023-02-26 NOTE — Progress Notes (Signed)
 Hi Maren, kidney function is fairly stable you tend to bounce between 0.9 up to 1.1.  The AST liver enzyme came down by 1 point which is good the ALT is still similar to what it was before.  Continue to work on healthy diet and increasing vegetable intake this will help with the inflammation in the liver.

## 2023-03-03 NOTE — Telephone Encounter (Signed)
Does she know which ones her insurance will cover?  Try to look back through my notes to see if I had documented anything.  But if she could clarify that that would save [place] some time at the pharmacy.

## 2023-03-05 ENCOUNTER — Telehealth: Payer: Self-pay | Admitting: Family Medicine

## 2023-03-05 NOTE — Telephone Encounter (Signed)
Copied from CRM [id number]. Topic: Clinical - Medical Advice >> Mar 05, 2023  9:57 AM Nila Nephew wrote: Reason for CRM: Patient is calling to demand a call from [place] [employer] personally. Attempted to inquire what [place]a B would need her to call back about and she states papers, refusing to provide any more detail. Please call patient back at number on file.

## 2023-03-05 NOTE — Telephone Encounter (Signed)
Per provider's last visit note -   "She needs updated paperwork for work she still rarely leaves the house except for maybe to go to the grocery store. She has a significant fear and phobia of germs and getting ill ever since she was severely hospitalized. She says sometimes even just getting out will trigger her anxiety and panic attacks. She has a history of panic disorder with agoraphobia".   Patient is on her way to the clinic to get the letter.

## 2023-03-08 ENCOUNTER — Other Ambulatory Visit: Payer: Self-pay | Admitting: Family Medicine

## 2023-03-08 DIAGNOSIS — J22 Unspecified acute lower respiratory infection: Secondary | ICD-10-CM

## 2023-03-09 MED ORDER — TIRZEPATIDE-WEIGHT MANAGEMENT 2.5 MG/0.5ML ~~LOC~~ SOLN
2.5000 mg | SUBCUTANEOUS | 0 refills | Status: DC
Start: 2023-03-09 — End: 2023-06-21

## 2023-03-09 NOTE — Telephone Encounter (Signed)
Okay, she would only qualify for Zepbound if she does not have diabetes.  I go ahead and send a prescription  Zepbound Prior Authorization is required 1 [id number]   Meds ordered this encounter  Medications   tirzepatide (ZEPBOUND) 2.5 MG/0.5ML injection vial    Sig: Inject 2.5 mg into the skin once a week.    Dispense:  2 mL    Refill:  0

## 2023-03-10 NOTE — Telephone Encounter (Signed)
He may have to call her I do not think she understands what the situation is.  If I write for [facility] then I am telling her insurance company that she has diabetes which she does not.  So I am not sure why [employer] is telling her this.  I agree, they are the same thing but if her insurance thinks that she has diabetes there can I want her on a statin, and ACE inhibitor, yearly eye exams, foot exams and everything that comes with being a diabetic.

## 2023-03-10 NOTE — Telephone Encounter (Signed)
Patient called this morning. She was told by [employer] that Zepbound and Greggory Keen is the same drug.  Mounjaro does not require a PA. She also said she asked what medication would they cover for weight loss and she was told Mounjaro. . Patient wants to be be prescribed the [facility]. Telephone number for PA [phone]

## 2023-03-11 NOTE — Telephone Encounter (Signed)
Pt's forms were completed and given to her and faxed to her employer and scanned into her chart.

## 2023-03-11 NOTE — Telephone Encounter (Signed)
Kristen Spence is completely fine with Dr Shelah Lewandowsky recommendations of the Zepbound.

## 2023-03-17 ENCOUNTER — Ambulatory Visit: Payer: Self-pay | Admitting: Family Medicine

## 2023-03-17 ENCOUNTER — Telehealth: Payer: Self-pay | Admitting: Family Medicine

## 2023-03-17 DIAGNOSIS — K2100 Gastro-esophageal reflux disease with esophagitis, without bleeding: Secondary | ICD-10-CM

## 2023-03-17 NOTE — Telephone Encounter (Signed)
 Copied from CRM [id number]. Topic: Clinical - Red Word Triage >> Mar 17, 2023 12:06 PM Alvino Blood C wrote: Red Word that prompted transfer to Nurse Triage: Patient believes she may have a sinus infection. She woke up with a sever headache states her face feels tight. She is also coughing up phlegm   Chief Complaint: Cough Symptoms: Cough, sinus pressure, mild chest tightness  Frequency: Frequent cough Pertinent Negatives: Patient denies shortness of breath, fever Disposition: [] ED /[] Urgent Care (no appt availability in office) / [x] Appointment(In office/virtual)/ []  [facility]/ [] Home Care/ [] Refused Recommended Disposition /[] [facility]/ []  Follow-up with PCP Additional Notes: Patient reports that she has had a cough for the last 2-3 days that is productive of yellow sputum. She states that with her cough she is now experiencing sinus pressure and headache. She states that she is also experiencing some mild middle chest tightness that is consistent with previous sinus infections and drainage. Appointment made for the patient tomorrow for evaluation and treatment.    Reason for Disposition  SEVERE coughing spells (e.g., whooping sound after coughing, vomiting after coughing)  Answer Assessment - Initial Assessment Questions 1. ONSET: "When did the cough begin?"      2-3 days 2. SEVERITY: "How bad is the cough today?"      Moderate to severe 3. SPUTUM: "Describe the color of your sputum" (none, dry cough; clear, white, yellow, green)     Yellow 4. HEMOPTYSIS: "Are you coughing up any blood?" If so ask: "How much?" (flecks, streaks, tablespoons, etc.)     No 5. DIFFICULTY BREATHING: "Are you having difficulty breathing?" If Yes, ask: "How bad is it?" (e.g., mild, moderate, severe)    - MILD: No SOB at rest, mild SOB with walking, speaks normally in sentences, can lie down, no retractions, pulse < 100.    - MODERATE: SOB at rest, SOB with minimal exertion and prefers to  sit, cannot lie down flat, speaks in phrases, mild retractions, audible wheezing, pulse 100-120.    - SEVERE: Very SOB at rest, speaks in single words, struggling to breathe, sitting hunched forward, retractions, pulse > 120      No 6. FEVER: "Do you have a fever?" If Yes, ask: "What is your temperature, how was it measured, and when did it start?"     No 7. CARDIAC HISTORY: "Do you have any history of heart disease?" (e.g., heart attack, congestive heart failure)      No 8. LUNG HISTORY: "Do you have any history of lung disease?"  (e.g., pulmonary embolus, asthma, emphysema)     No 9. PE RISK FACTORS: "Do you have a history of blood clots?" (or: recent major surgery, recent prolonged travel, bedridden)     No 10. OTHER SYMPTOMS: "Do you have any other symptoms?" (e.g., runny nose, wheezing, chest pain)       Sinus pressure,  mild chest tightness  11. PREGNANCY: "Is there any chance you are pregnant?" "When was your last menstrual period?"       No 12. TRAVEL: "Have you traveled out of the country in the last month?" (e.g., travel history, exposures)       No  Protocols used: Cough - Acute Productive-A-AH

## 2023-03-17 NOTE — Telephone Encounter (Signed)
Mychart send.  

## 2023-03-18 ENCOUNTER — Ambulatory Visit: Payer: BC Managed Care – PPO | Admitting: Family Medicine

## 2023-03-18 VITALS — BP 125/75 | HR 62 | Temp 97.8°F | Ht 61.0 in | Wt 233.0 lb

## 2023-03-18 DIAGNOSIS — R051 Acute cough: Secondary | ICD-10-CM

## 2023-03-18 DIAGNOSIS — J019 Acute sinusitis, unspecified: Secondary | ICD-10-CM

## 2023-03-18 LAB — POC COVID19 BINAXNOW: SARS Coronavirus 2 Ag: NEGATIVE

## 2023-03-18 LAB — POCT INFLUENZA A/B
Influenza A, POC: NEGATIVE
Influenza B, POC: NEGATIVE

## 2023-03-18 MED ORDER — AMOXICILLIN 875 MG PO TABS: 875.0000 mg | ORAL_TABLET | Freq: Two times a day (BID) | ORAL | 0 refills | Status: DC

## 2023-03-18 MED ORDER — HYDROCODONE BIT-HOMATROP MBR 5-1.5 MG/5ML PO SOLN: 5.0000 mL | Freq: Three times a day (TID) | ORAL | 0 refills | Status: DC | PRN

## 2023-03-18 NOTE — Telephone Encounter (Signed)
 Patient seen in office today by Dr. Linford Arnold

## 2023-03-18 NOTE — Progress Notes (Signed)
 Pt reports that her sxs began 2 days ago when the weather changed. She states that she has been taking Mucinex and sinus relief.

## 2023-03-18 NOTE — Progress Notes (Signed)
 Acute Office Visit  Subjective:     Patient ID: Kristen Spence, female    DOB: 03-03-1960, 63 y.o.   MRN: [id number]  Chief Complaint  Patient presents with   Sinusitis   Cough    HPI Patient is in today for 2 days cough, sinus drainage (yellow), fever and laringitis. Lots of sinus pressure.  Fever to 101.  Left ear feels full and popping.  Feels like the cough is from the drainage.   ROS      Objective:    BP 125/75   Pulse 62   Temp 97.8 F (36.6 C) (Oral)   Ht 5\' 1"  (1.549 m)   Wt 233 lb (105.7 kg)   SpO2 97%   BMI 44.02 kg/m    Physical Exam Constitutional:      Appearance: Normal appearance.  HENT:     Head: Normocephalic and atraumatic.     Right Ear: Tympanic membrane, ear canal and external ear normal. There is no impacted cerumen.     Left Ear: Tympanic membrane, ear canal and external ear normal. There is no impacted cerumen.     Nose: Nose normal.     Mouth/Throat:     Pharynx: Oropharynx is clear.  Eyes:     Conjunctiva/sclera: Conjunctivae normal.  Cardiovascular:     Rate and Rhythm: Normal rate and regular rhythm.  Pulmonary:     Effort: Pulmonary effort is normal.     Breath sounds: Normal breath sounds.  Musculoskeletal:     Cervical back: Neck supple. No tenderness.  Lymphadenopathy:     Cervical: No cervical adenopathy.  Skin:    General: Skin is warm and dry.  Neurological:     Mental Status: She is alert and oriented to person, place, and time.  Psychiatric:        Mood and Affect: Mood normal.     Results for orders placed or performed in visit on 03/18/23  POC COVID-19  Result Value Ref Range   SARS Coronavirus 2 Ag Negative Negative  POCT Influenza A/B  Result Value Ref Range   Influenza A, POC Negative Negative   Influenza B, POC Negative Negative        Assessment & Plan:   Problem List Items Addressed This Visit   None Visit Diagnoses       Acute non-recurrent sinusitis, unspecified location    -  Primary    Relevant Medications   amoxicillin (AMOXIL) 875 MG tablet   HYDROcodone bit-homatropine (HYCODAN) 5-1.5 MG/5ML syrup   Other Relevant Orders   POC COVID-19 (Completed)   POCT Influenza A/B (Completed)     Acute cough       Relevant Orders   POC COVID-19 (Completed)   POCT Influenza A/B (Completed)      Acute sinusitis-we discussed that this still could be viral she is negative for flu and for COVID which is great.  I am going to go ahead and give her a prescription to fill if she gets worse over the weekend or if she is just not improving.  Also prescribe hydrocodone cough syrup for bedtime since the cough is been keeping her awake.  Recommend running humidifier and continue with symptomatic care.  Okay to use her Robitussin during the day.  Meds ordered this encounter  Medications   amoxicillin (AMOXIL) 875 MG tablet    Sig: Take 1 tablet (875 mg total) by mouth 2 (two) times daily.    Dispense:  14 tablet  Refill:  0   HYDROcodone bit-homatropine (HYCODAN) 5-1.5 MG/5ML syrup    Sig: Take 5 mLs by mouth every 8 (eight) hours as needed for cough.    Dispense:  75 mL    Refill:  0    No follow-ups on file.  Nani Gasser, MD

## 2023-03-19 ENCOUNTER — Encounter: Payer: Self-pay | Admitting: Family Medicine

## 2023-03-29 ENCOUNTER — Other Ambulatory Visit: Payer: Self-pay | Admitting: Family Medicine

## 2023-03-29 DIAGNOSIS — F4321 Adjustment disorder with depressed mood: Secondary | ICD-10-CM

## 2023-04-01 ENCOUNTER — Other Ambulatory Visit: Payer: Self-pay | Admitting: Family Medicine

## 2023-04-01 DIAGNOSIS — F4321 Adjustment disorder with depressed mood: Secondary | ICD-10-CM

## 2023-04-01 NOTE — Telephone Encounter (Signed)
 Prior auth for: [facility]  Determination: APPROVED Auth #Maryan Puls / [id number] Valid from: 04/01/23 - 11/27/23 Patient notified via MyChart

## 2023-04-15 MED ORDER — OMEPRAZOLE 20 MG PO CPDR: 20.0000 mg | DELAYED_RELEASE_CAPSULE | Freq: Two times a day (BID) | ORAL | 1 refills | Status: DC

## 2023-04-15 NOTE — Addendum Note (Signed)
 Addended by: Ernest Mallick A on: 04/15/2023 11:14 AM   Modules accepted: Orders

## 2023-04-15 NOTE — Addendum Note (Signed)
 Addended by: Nani Gasser D on: 04/15/2023 02:33 PM   Modules accepted: Orders

## 2023-05-04 DIAGNOSIS — R82998 Other abnormal findings in urine: Secondary | ICD-10-CM | POA: Diagnosis not present

## 2023-05-04 DIAGNOSIS — N200 Calculus of kidney: Secondary | ICD-10-CM | POA: Diagnosis not present

## 2023-05-05 DIAGNOSIS — Z90710 Acquired absence of both cervix and uterus: Secondary | ICD-10-CM | POA: Diagnosis not present

## 2023-05-05 DIAGNOSIS — N200 Calculus of kidney: Secondary | ICD-10-CM | POA: Diagnosis not present

## 2023-05-05 DIAGNOSIS — N838 Other noninflammatory disorders of ovary, fallopian tube and broad ligament: Secondary | ICD-10-CM | POA: Diagnosis not present

## 2023-05-05 DIAGNOSIS — Z9049 Acquired absence of other specified parts of digestive tract: Secondary | ICD-10-CM | POA: Diagnosis not present

## 2023-06-15 ENCOUNTER — Other Ambulatory Visit: Payer: Self-pay | Admitting: Family Medicine

## 2023-06-19 ENCOUNTER — Other Ambulatory Visit: Payer: Self-pay | Admitting: Family Medicine

## 2023-06-19 DIAGNOSIS — Z7689 Persons encountering health services in other specified circumstances: Secondary | ICD-10-CM

## 2023-07-05 ENCOUNTER — Ambulatory Visit: Payer: BC Managed Care – PPO | Admitting: Family Medicine

## 2023-07-05 ENCOUNTER — Encounter: Payer: Self-pay | Admitting: Family Medicine

## 2023-07-05 VITALS — BP 133/74 | HR 68 | Ht 61.0 in | Wt 229.2 lb

## 2023-07-05 DIAGNOSIS — K047 Periapical abscess without sinus: Secondary | ICD-10-CM

## 2023-07-05 DIAGNOSIS — F4321 Adjustment disorder with depressed mood: Secondary | ICD-10-CM

## 2023-07-05 DIAGNOSIS — F4322 Adjustment disorder with anxiety: Secondary | ICD-10-CM | POA: Diagnosis not present

## 2023-07-05 DIAGNOSIS — K760 Fatty (change of) liver, not elsewhere classified: Secondary | ICD-10-CM | POA: Diagnosis not present

## 2023-07-05 DIAGNOSIS — I10 Essential (primary) hypertension: Secondary | ICD-10-CM

## 2023-07-05 DIAGNOSIS — G4709 Other insomnia: Secondary | ICD-10-CM

## 2023-07-05 DIAGNOSIS — Z7689 Persons encountering health services in other specified circumstances: Secondary | ICD-10-CM

## 2023-07-05 MED ORDER — ALPRAZOLAM 0.25 MG PO TABS
0.2500 mg | ORAL_TABLET | Freq: Every day | ORAL | 0 refills | Status: DC | PRN
Start: 2023-07-05 — End: 2023-10-05

## 2023-07-05 MED ORDER — ZEPBOUND 5 MG/0.5ML ~~LOC~~ SOAJ: 5.0000 mg | SUBCUTANEOUS | 0 refills | Status: DC

## 2023-07-05 MED ORDER — AMOXICILLIN-POT CLAVULANATE 875-125 MG PO TABS: 1.0000 | ORAL_TABLET | Freq: Two times a day (BID) | ORAL | 0 refills | Status: DC

## 2023-07-05 NOTE — Assessment & Plan Note (Signed)
 Visit #: 2 Starting Weight: 233 lbs    Current weight: 229 lbs Previous weight:233 lbs Change in weight: down 4 lbs  Goal weight: 150 lbs  Dietary goals: continue to avoid soda and work on inc protein and veggies intake.  Exercise goals:Stay active, start doing some band exercises for strengthening. Medication: Continue Zepbound  2.5mg , then inc to 5 mg   Follow-up and referrals: 8-12 weeks

## 2023-07-05 NOTE — Assessment & Plan Note (Signed)
 Encouraged her to use alprazolam  sparingly and not use it nightly as she can become dependent on it for sleep.  Usually just uses that when her mind really starts racing about things.

## 2023-07-05 NOTE — Assessment & Plan Note (Signed)
 Can you with current regimen.  Call if any problems or concerns.  Currently on Cymbalta  60 mg daily, Wellbutrin  and Xanax  as needed.

## 2023-07-05 NOTE — Assessment & Plan Note (Signed)
 Still working through her grief and having some good days between the bad days but overall doing well.

## 2023-07-05 NOTE — Progress Notes (Signed)
 Established Patient Office Visit  Subjective  Patient ID: Kristen Spence, female    DOB: 1960-08-15  Age: 63 y.o. MRN: [id number]  Chief Complaint  Patient presents with   Hypertension    6 month f/u visit     HPI  Hypertension- Pt denies chest pain, SOB, dizziness, or heart palpitations.  Taking meds as directed w/o problems.  Denies medication side effects.    She is doing well overall.  Still having some open day down emotionally.  She says she still pretty much stays in her house rarely gets out.  She really has not let anybody into her home.  More recently she cleaned out her car which was a big step for her.  Just emotionally she just has not had the energy to do it at some point she plans on starting to clean out her husband's room and go through his close again emotionally she just has not been there at a place where she felt like she could do it but that is a goal for her.  Still grieving.  She uses alprazolam  at bedtime to sleep.  More often than not but she does not use it nightly.  Is finally able to get the Zepbound  approved she is currently on 2.5 mg but is almost 2 for a new prescription so far she has been tolerating it well.  Also think she might have a dental infection she has a tooth that is cracked and has some pain along the right upper jawline.  She has a dentist appointment but it is not for 2 more weeks was originally scheduled a month out and they were able to move her up 2 weeks.    ROS    Objective:     BP 133/74   Pulse 68   Ht 5' 1 (1.549 m)   Wt 229 lb 4 oz (104 kg)   SpO2 98%   BMI 43.32 kg/m    Physical Exam   No results found for any visits on 07/05/23.    The 10-year ASCVD risk score (Arnett DK, et al., 2019) is: 5.5%    Assessment & Plan:   Problem List Items Addressed This Visit       Cardiovascular and Mediastinum   HYPERTENSION, BENIGN - Primary   Relevant Orders   CMP14+EGFR   Lipid panel   CBC     Digestive    Hepatic steatosis   Relevant Orders   CMP14+EGFR   Lipid panel   CBC     Other   Other insomnia   Encouraged her to use alprazolam  sparingly and not use it nightly as she can become dependent on it for sleep.  Usually just uses that when her mind really starts racing about things.      Encounter for weight management   Visit #: 2 Starting Weight: 233 lbs    Current weight: 229 lbs Previous weight:233 lbs Change in weight: down 4 lbs  Goal weight: 150 lbs  Dietary goals: continue to avoid soda and work on inc protein and veggies intake.  Exercise goals:Stay active, start doing some band exercises for strengthening. Medication: Continue Zepbound  2.5mg , then inc to 5 mg   Follow-up and referrals: 8-12 weeks      Relevant Medications   tirzepatide  (ZEPBOUND ) 5 MG/0.5ML Pen   Complicated grief   Still working through her grief and having some good days between the bad days but overall doing well.      Relevant  Medications   ALPRAZolam  (XANAX ) 0.25 MG tablet   Adjustment disorder with anxiety   Can you with current regimen.  Call if any problems or concerns.  Currently on Cymbalta  60 mg daily, Wellbutrin  and Xanax  as needed.      Other Visit Diagnoses       Dental infection       Relevant Medications   amoxicillin -clavulanate (AUGMENTIN ) 875-125 MG tablet       dental infection-will go ahead and treat with Augmentin  until she can get in with her dentist  Return in about 3 months (around 10/05/2023) for Weight mgt .    Duaine German, MD

## 2023-07-06 ENCOUNTER — Ambulatory Visit: Payer: Self-pay | Admitting: Family Medicine

## 2023-07-06 LAB — CBC
Hematocrit: 47.7 % — ABNORMAL HIGH (ref 34.0–46.6)
Hemoglobin: 15.2 g/dL (ref 11.1–15.9)
MCH: 30.1 pg (ref 26.6–33.0)
MCHC: 31.9 g/dL (ref 31.5–35.7)
MCV: 95 fL (ref 79–97)
Platelets: 349 10*3/uL (ref 150–450)
RBC: 5.05 x10E6/uL (ref 3.77–5.28)
RDW: 13 % (ref 11.7–15.4)
WBC: 10 10*3/uL (ref 3.4–10.8)

## 2023-07-06 LAB — LIPID PANEL
Chol/HDL Ratio: 3.7 ratio (ref 0.0–4.4)
Cholesterol, Total: 170 mg/dL (ref 100–199)
HDL: 46 mg/dL (ref >39)
LDL Chol Calc (NIH): 103 mg/dL — ABNORMAL HIGH (ref 0–99)
Triglycerides: 114 mg/dL (ref 0–149)
VLDL Cholesterol Cal: 21 mg/dL (ref 5–40)

## 2023-07-06 LAB — CMP14+EGFR
ALT: 56 IU/L — ABNORMAL HIGH (ref 0–32)
AST: 44 IU/L — ABNORMAL HIGH (ref 0–40)
Albumin: 4.4 g/dL (ref 3.9–4.9)
Alkaline Phosphatase: 86 IU/L (ref 44–121)
BUN/Creatinine Ratio: 12 (ref 12–28)
BUN: 12 mg/dL (ref 8–27)
Bilirubin Total: 0.4 mg/dL (ref 0.0–1.2)
CO2: 21 mmol/L (ref 20–29)
Calcium: 9.9 mg/dL (ref 8.7–10.3)
Chloride: 103 mmol/L (ref 96–106)
Creatinine, Ser: 1.01 mg/dL — ABNORMAL HIGH (ref 0.57–1.00)
Globulin, Total: 2.8 g/dL (ref 1.5–4.5)
Glucose: 104 mg/dL — ABNORMAL HIGH (ref 70–99)
Potassium: 4.1 mmol/L (ref 3.5–5.2)
Sodium: 141 mmol/L (ref 134–144)
Total Protein: 7.2 g/dL (ref 6.0–8.5)
eGFR: 63 mL/min/{1.73_m2} (ref >59)

## 2023-07-06 NOTE — Progress Notes (Signed)
 Hi Kristen Spence, it was great seeing you yesterday.  Kidney function is stable at 1.0 you tend to bounce between 0.9-1.1 so I think that is kind of your baseline.  Your liver function is elevated similar to what it has been over the last 6 months so no significant changes there so again stable continue to work on healthy diet more Mediterranean style with nuts lean meats, using olive oil etc. this can make a big difference in inflammation in the liver.  LDL is just borderline but almost at goal.  And blood count looks great no sign of anemia.

## 2023-07-15 ENCOUNTER — Telehealth: Payer: Self-pay

## 2023-07-15 NOTE — Telephone Encounter (Signed)
 [facility] sent a letter stating Zepbound  will no longer covered through her plan.   I called and she has agreed to switch to Wegovy . I did advise it will probably need a prior authorization.

## 2023-07-16 MED ORDER — WEGOVY 1 MG/0.5ML ~~LOC~~ SOAJ: 1.0000 mg | SUBCUTANEOUS | 0 refills | Status: DC

## 2023-07-16 NOTE — Telephone Encounter (Signed)
 Prescription sent for Wegovy  in place of Zepbound .  Meds ordered this encounter  Medications   Semaglutide -Weight Management (WEGOVY ) 1 MG/0.5ML SOAJ    Sig: Inject 1 mg into the skin every 7 (seven) days.    Dispense:  6 mL    Refill:  0

## 2023-07-27 DIAGNOSIS — R1900 Intra-abdominal and pelvic swelling, mass and lump, unspecified site: Secondary | ICD-10-CM | POA: Diagnosis not present

## 2023-07-27 DIAGNOSIS — N83202 Unspecified ovarian cyst, left side: Secondary | ICD-10-CM | POA: Diagnosis not present

## 2023-08-09 DIAGNOSIS — N83202 Unspecified ovarian cyst, left side: Secondary | ICD-10-CM | POA: Diagnosis not present

## 2023-08-09 DIAGNOSIS — R1900 Intra-abdominal and pelvic swelling, mass and lump, unspecified site: Secondary | ICD-10-CM | POA: Diagnosis not present

## 2023-08-30 ENCOUNTER — Ambulatory Visit: Admitting: Family Medicine

## 2023-09-02 ENCOUNTER — Encounter: Payer: Self-pay | Admitting: Family Medicine

## 2023-09-02 ENCOUNTER — Ambulatory Visit: Admitting: Family Medicine

## 2023-09-02 VITALS — BP 129/76 | HR 64 | Ht 61.0 in | Wt 230.0 lb

## 2023-09-02 DIAGNOSIS — R21 Rash and other nonspecific skin eruption: Secondary | ICD-10-CM | POA: Diagnosis not present

## 2023-09-02 DIAGNOSIS — Z01818 Encounter for other preprocedural examination: Secondary | ICD-10-CM | POA: Diagnosis not present

## 2023-09-02 DIAGNOSIS — I10 Essential (primary) hypertension: Secondary | ICD-10-CM

## 2023-09-02 DIAGNOSIS — K760 Fatty (change of) liver, not elsewhere classified: Secondary | ICD-10-CM | POA: Diagnosis not present

## 2023-09-02 MED ORDER — TRIAMCINOLONE ACETONIDE 0.1 % EX CREA: 1.0000 | TOPICAL_CREAM | Freq: Two times a day (BID) | CUTANEOUS | 0 refills | Status: DC

## 2023-09-02 NOTE — Progress Notes (Signed)
 Pt stated that she has a mass on her ovary and was informed that the phone call was her pre-op. She stated that she need to speak to her doctor about this. Her concern is because of her previous Hx of sepsis.

## 2023-09-02 NOTE — Assessment & Plan Note (Signed)
 Pressure looks great today.

## 2023-09-02 NOTE — Assessment & Plan Note (Signed)
 At updated liver enzymes.  She did hold her GLP-1 which she has been taking

## 2023-09-02 NOTE — Progress Notes (Signed)
 Established Patient Office Visit  Subjective  Patient ID: Kristen Spence, female    DOB: [birth year]  Age: 63 y.o. MRN: [id number]  Chief Complaint  Patient presents with   Medical Management of Chronic Issues    HPI She has had an intermittent itchy rash on her forearms and upper chest she wonders if it is just triggered by stress no other specific changes that she thinks may have caused it it is a little itchy.  She originally had some imaging for a kidney stone in on May 05, 2023 and it showed interval increase in left adnexal  cyst and was measuring approximately 4.6 cm.  She did follow-up with GYN and they ended up doing some labs, including a CA125.  They ended up referring her to Dr. Almarie Barrio for further evaluation and possible removal.  She did do a consultation and they have recommended surgery she just wanted to make sure that she is safe for surgery before proceeding.  She would like to get labs beforehand as well instead of waiting until the day of surgery.  They are planning on doing it laparoscopically.     ROS    Objective:     BP 129/76   Pulse 64   Ht 5' 1 (1.549 m)   Wt 230 lb 0.6 oz (104.3 kg)   SpO2 98%   BMI 43.47 kg/m    Physical Exam Vitals and nursing note reviewed.  Constitutional:      Appearance: Normal appearance.  HENT:     Head: Normocephalic and atraumatic.  Eyes:     Conjunctiva/sclera: Conjunctivae normal.  Cardiovascular:     Rate and Rhythm: Normal rate and regular rhythm.  Pulmonary:     Effort: Pulmonary effort is normal.     Breath sounds: Normal breath sounds.  Skin:    General: Skin is warm and dry.  Neurological:     Mental Status: She is alert.  Psychiatric:        Mood and Affect: Mood normal.      No results found for any visits on 09/02/23.    The 10-year ASCVD risk score (Arnett DK, et al., 2019) is: 5%    Assessment & Plan:   Problem List Items Addressed This Visit       Cardiovascular  and Mediastinum   HYPERTENSION, BENIGN - Primary   Pressure looks great today.      Relevant Orders   CMP14+EGFR   Lipid panel   CBC     Digestive   Hepatic steatosis   At updated liver enzymes.  She did hold her GLP-1 which she has been taking      Relevant Orders   CMP14+EGFR   Lipid panel   CBC   Other Visit Diagnoses       Preop examination         Rash       Relevant Medications   triamcinolone  cream (KENALOG ) 0.1 %       Preoperatively-I think she is overall low risk for surgery blood pressure is at goal we will get some up-to-date labs to make sure that she has no residual anemia she has had a prior history of iron deficiency anemia and to make sure electrolytes etc. are well within normal limits.  She has already stopped the GLP-1 for now.  Will call with results once available we will also call [facility] to see if we can get those labs that were drawn when she had  further workup there.  Rash--Will treat with topical triamcinolone cream.  If not improving please let us  know.  No follow-ups on file.    Dorothyann Byars, MD

## 2023-09-03 ENCOUNTER — Other Ambulatory Visit: Payer: Self-pay | Admitting: Family Medicine

## 2023-09-03 ENCOUNTER — Ambulatory Visit: Payer: Self-pay | Admitting: Family Medicine

## 2023-09-03 DIAGNOSIS — Z1231 Encounter for screening mammogram for malignant neoplasm of breast: Secondary | ICD-10-CM

## 2023-09-03 LAB — CMP14+EGFR
ALT: 67 IU/L — ABNORMAL HIGH (ref 0–32)
AST: 62 IU/L — ABNORMAL HIGH (ref 0–40)
Albumin: 4.4 g/dL (ref 3.9–4.9)
Alkaline Phosphatase: 90 IU/L (ref 44–121)
BUN/Creatinine Ratio: 11 — ABNORMAL LOW (ref 12–28)
BUN: 11 mg/dL (ref 8–27)
Bilirubin Total: 0.7 mg/dL (ref 0.0–1.2)
CO2: 21 mmol/L (ref 20–29)
Calcium: 10.4 mg/dL — ABNORMAL HIGH (ref 8.7–10.3)
Chloride: 102 mmol/L (ref 96–106)
Creatinine, Ser: 0.98 mg/dL (ref 0.57–1.00)
Globulin, Total: 2.9 g/dL (ref 1.5–4.5)
Glucose: 74 mg/dL (ref 70–99)
Potassium: 4.4 mmol/L (ref 3.5–5.2)
Sodium: 140 mmol/L (ref 134–144)
Total Protein: 7.3 g/dL (ref 6.0–8.5)
eGFR: 66 mL/min/1.73 (ref >59)

## 2023-09-03 LAB — CBC
Hematocrit: 45.4 % (ref 34.0–46.6)
Hemoglobin: 14.9 g/dL (ref 11.1–15.9)
MCH: 29.7 pg (ref 26.6–33.0)
MCHC: 32.8 g/dL (ref 31.5–35.7)
MCV: 91 fL (ref 79–97)
Platelets: 354 x10E3/uL (ref 150–450)
RBC: 5.01 x10E6/uL (ref 3.77–5.28)
RDW: 12.8 % (ref 11.7–15.4)
WBC: 9.6 x10E3/uL (ref 3.4–10.8)

## 2023-09-03 LAB — LIPID PANEL
Chol/HDL Ratio: 4.1 ratio (ref 0.0–4.4)
Cholesterol, Total: 199 mg/dL (ref 100–199)
HDL: 48 mg/dL (ref >39)
LDL Chol Calc (NIH): 128 mg/dL — ABNORMAL HIGH (ref 0–99)
Triglycerides: 127 mg/dL (ref 0–149)
VLDL Cholesterol Cal: 23 mg/dL (ref 5–40)

## 2023-09-03 NOTE — Progress Notes (Signed)
 Overall labs ok. Liver enzymes are still mildly elevated.  Similar to past levels. At some point in the fall I would like to get an US  of your liver if we can.

## 2023-09-06 ENCOUNTER — Ambulatory Visit: Payer: Self-pay | Admitting: *Deleted

## 2023-09-08 ENCOUNTER — Encounter

## 2023-09-08 DIAGNOSIS — Z1231 Encounter for screening mammogram for malignant neoplasm of breast: Secondary | ICD-10-CM

## 2023-09-14 DIAGNOSIS — D282 Benign neoplasm of uterine tubes and ligaments: Secondary | ICD-10-CM | POA: Diagnosis not present

## 2023-09-14 DIAGNOSIS — N858 Other specified noninflammatory disorders of uterus: Secondary | ICD-10-CM | POA: Diagnosis not present

## 2023-09-14 DIAGNOSIS — D271 Benign neoplasm of left ovary: Secondary | ICD-10-CM | POA: Diagnosis not present

## 2023-09-14 DIAGNOSIS — D270 Benign neoplasm of right ovary: Secondary | ICD-10-CM | POA: Diagnosis not present

## 2023-09-14 DIAGNOSIS — R1900 Intra-abdominal and pelvic swelling, mass and lump, unspecified site: Secondary | ICD-10-CM | POA: Diagnosis not present

## 2023-09-29 ENCOUNTER — Other Ambulatory Visit: Payer: Self-pay | Admitting: Family Medicine

## 2023-09-29 DIAGNOSIS — R1900 Intra-abdominal and pelvic swelling, mass and lump, unspecified site: Secondary | ICD-10-CM | POA: Diagnosis not present

## 2023-09-29 DIAGNOSIS — R300 Dysuria: Secondary | ICD-10-CM | POA: Diagnosis not present

## 2023-10-01 ENCOUNTER — Other Ambulatory Visit: Payer: Self-pay | Admitting: Family Medicine

## 2023-10-01 DIAGNOSIS — F4321 Adjustment disorder with depressed mood: Secondary | ICD-10-CM

## 2023-10-05 ENCOUNTER — Other Ambulatory Visit: Payer: Self-pay | Admitting: Family Medicine

## 2023-10-05 DIAGNOSIS — F4321 Adjustment disorder with depressed mood: Secondary | ICD-10-CM

## 2023-10-05 DIAGNOSIS — H00025 Hordeolum internum left lower eyelid: Secondary | ICD-10-CM | POA: Diagnosis not present

## 2023-10-05 NOTE — Telephone Encounter (Signed)
 Copied from CRM [id number]. Topic: Clinical - Medication Refill >> Oct 05, 2023 11:50 AM Corin V wrote: Medication: ALPRAZolam  (XANAX ) 0.25 MG tablet Request from pharmacy received 10/01/23  Has the patient contacted their pharmacy? Yes (Agent: If no, request that the patient contact the pharmacy for the refill. If patient does not wish to contact the pharmacy document the reason why and proceed with request.) (Agent: If yes, when and what did the pharmacy advise?)  This is the patient's preferred pharmacy:  [phone] [place] [place] - [address] [place] [address] Phone: [phone] Fax: [phone]  Is this the correct pharmacy for this prescription? Yes If no, delete pharmacy and type the correct one.   Has the prescription been filled recently? No  Is the patient out of the medication? Yes  Has the patient been seen for an appointment in the last year OR does the patient have an upcoming appointment? Yes  Can we respond through MyChart? No  Agent: Please be advised that Rx refills may take up to 3 business days. We ask that you follow-up with your pharmacy.

## 2023-10-05 NOTE — Telephone Encounter (Signed)
 [facility] database checked last RF 07/08/2023 #90, next OV 10/11/2023

## 2023-10-11 ENCOUNTER — Ambulatory Visit (INDEPENDENT_AMBULATORY_CARE_PROVIDER_SITE_OTHER): Admitting: Family Medicine

## 2023-10-11 VITALS — BP 162/85 | HR 72 | Ht 61.0 in | Wt 232.0 lb

## 2023-10-11 DIAGNOSIS — Z7689 Persons encountering health services in other specified circumstances: Secondary | ICD-10-CM

## 2023-10-11 DIAGNOSIS — Z713 Dietary counseling and surveillance: Secondary | ICD-10-CM | POA: Diagnosis not present

## 2023-10-11 IMAGING — MG MM DIGITAL SCREENING BILAT W/ TOMO AND CAD
6 of 10 series · 6 of 30 positions shown · non-contrast
Comparison: Previous exam(s).

CLINICAL DATA: Screening.

EXAM:
DIGITAL SCREENING BILATERAL MAMMOGRAM WITH TOMOSYNTHESIS AND CAD
TECHNIQUE: Bilateral screening digital craniocaudal and mediolateral oblique
mammograms were obtained. Bilateral screening digital breast
tomosynthesis was performed. The images were evaluated with
computer-aided detection.

[L CC synth-2D]
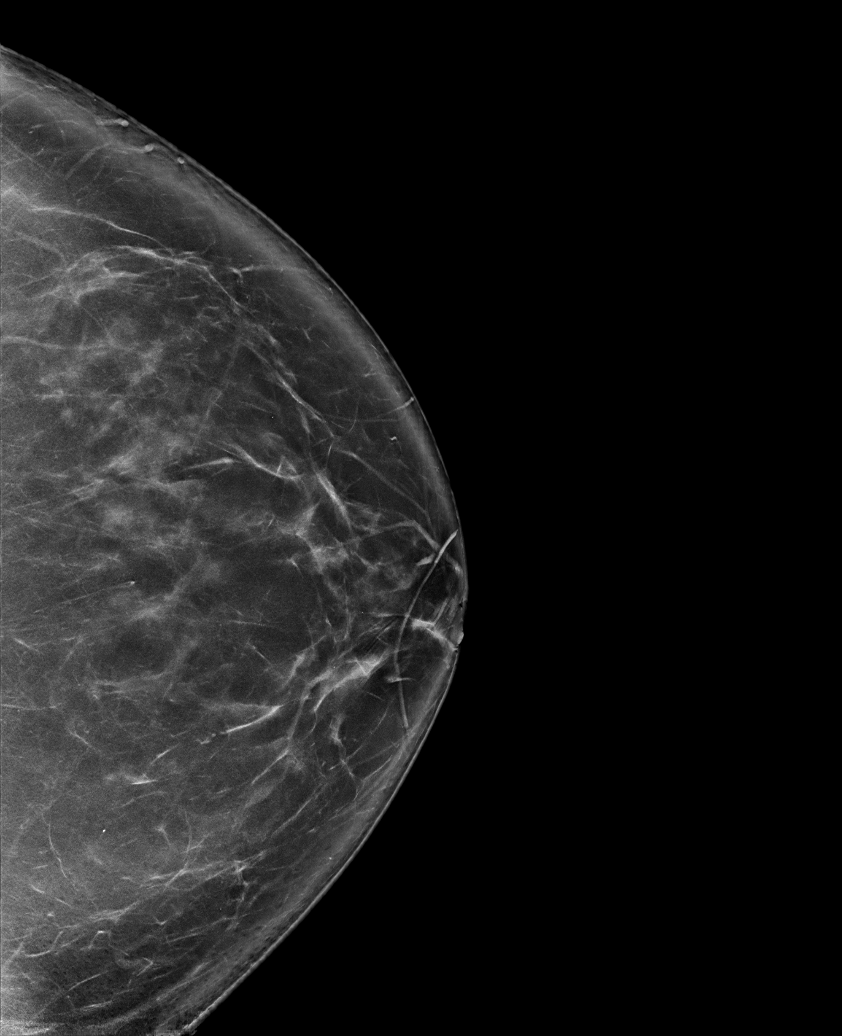

[L MLO synth-2D]
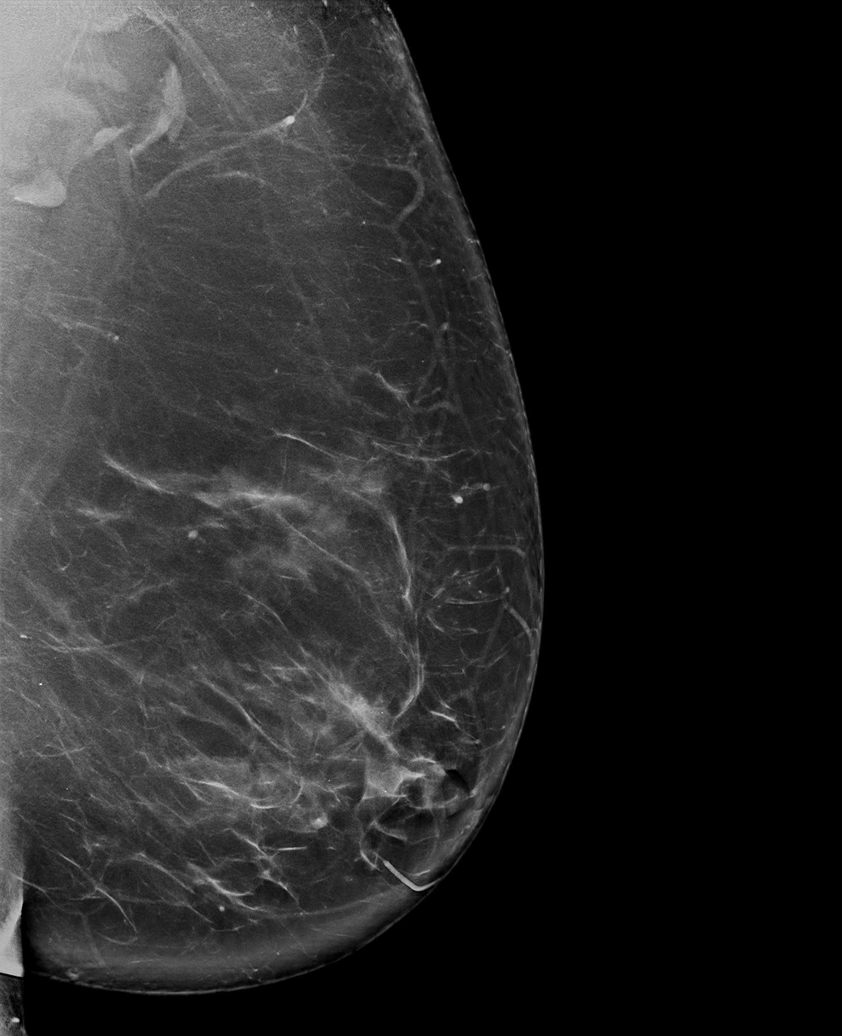

[R CC synth-2D]
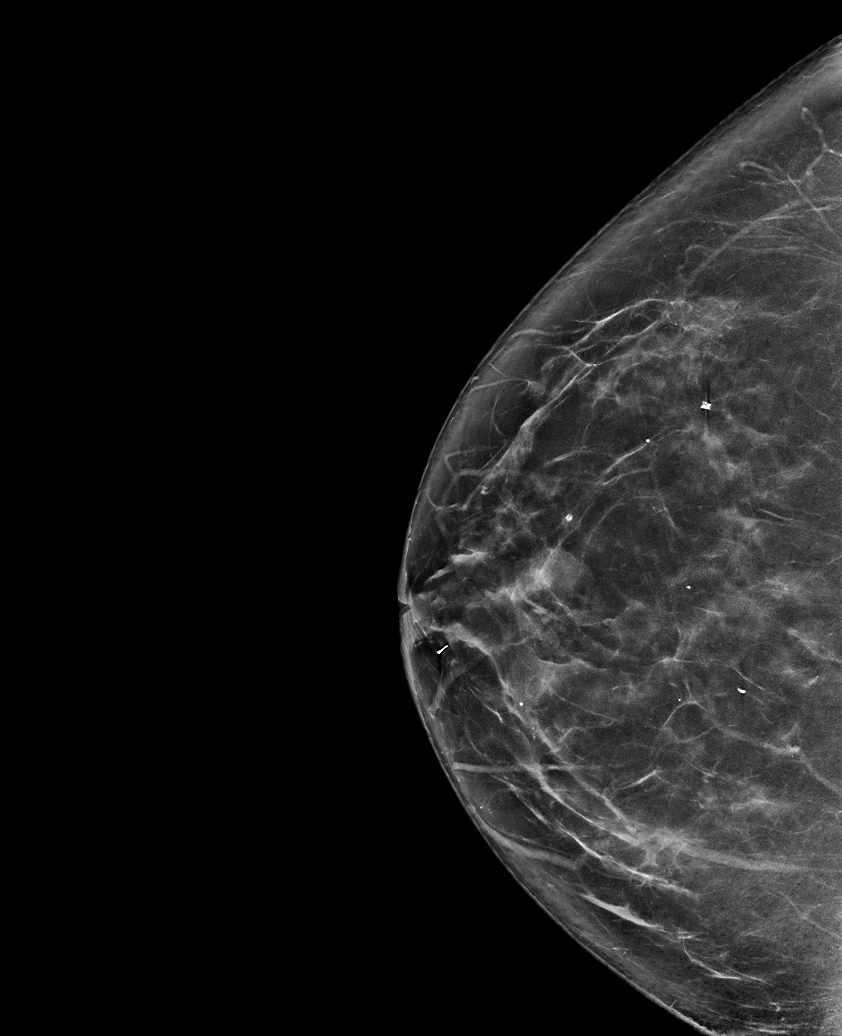

[R MLO synth-2D]
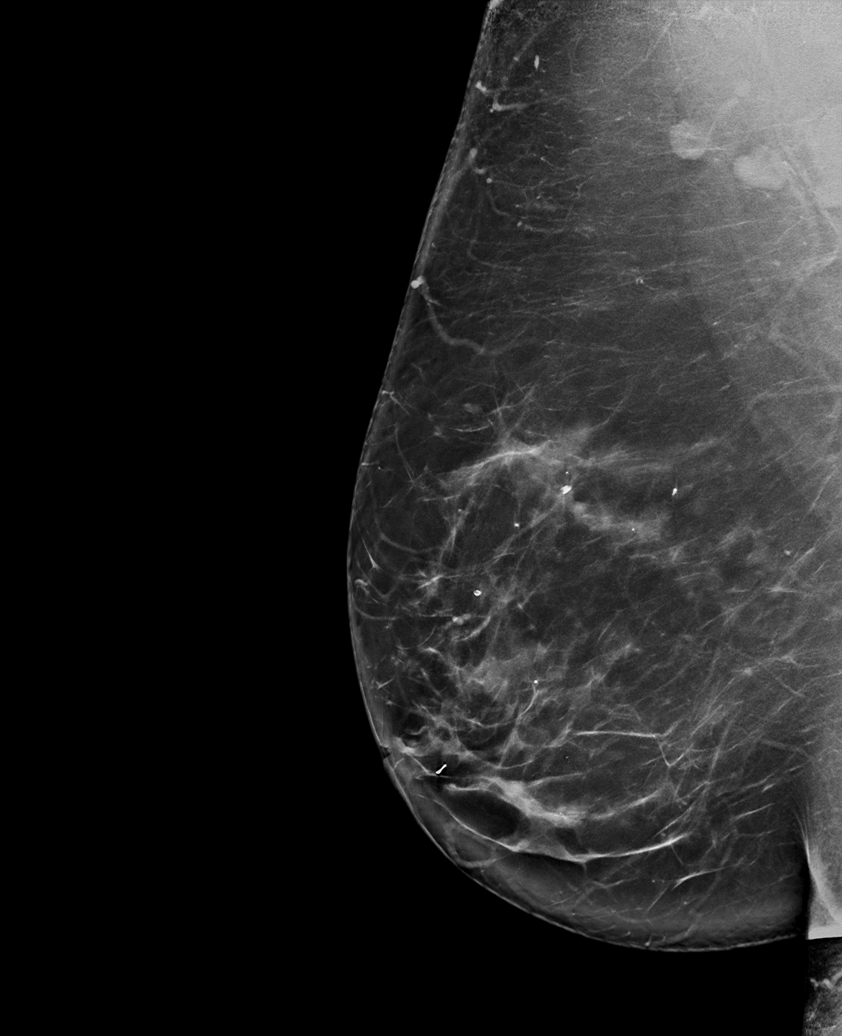

[L CV synth-2D]
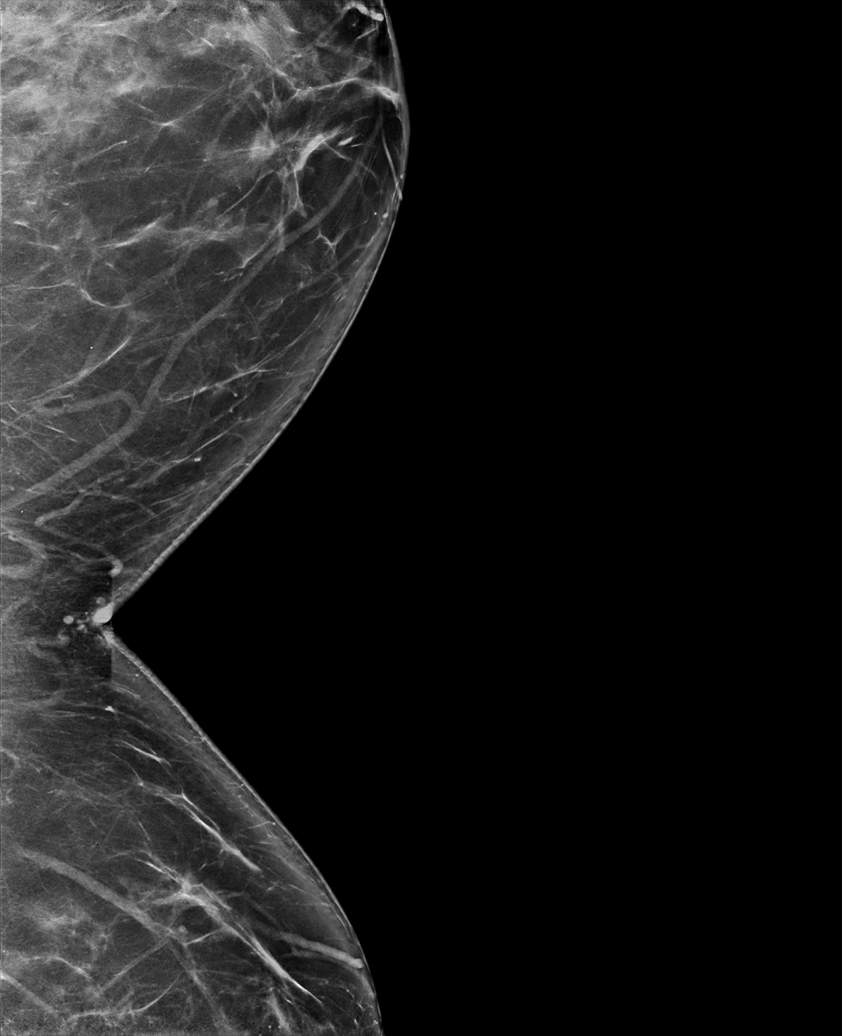

[R MLO tomo · tomo slice 52/103.0]
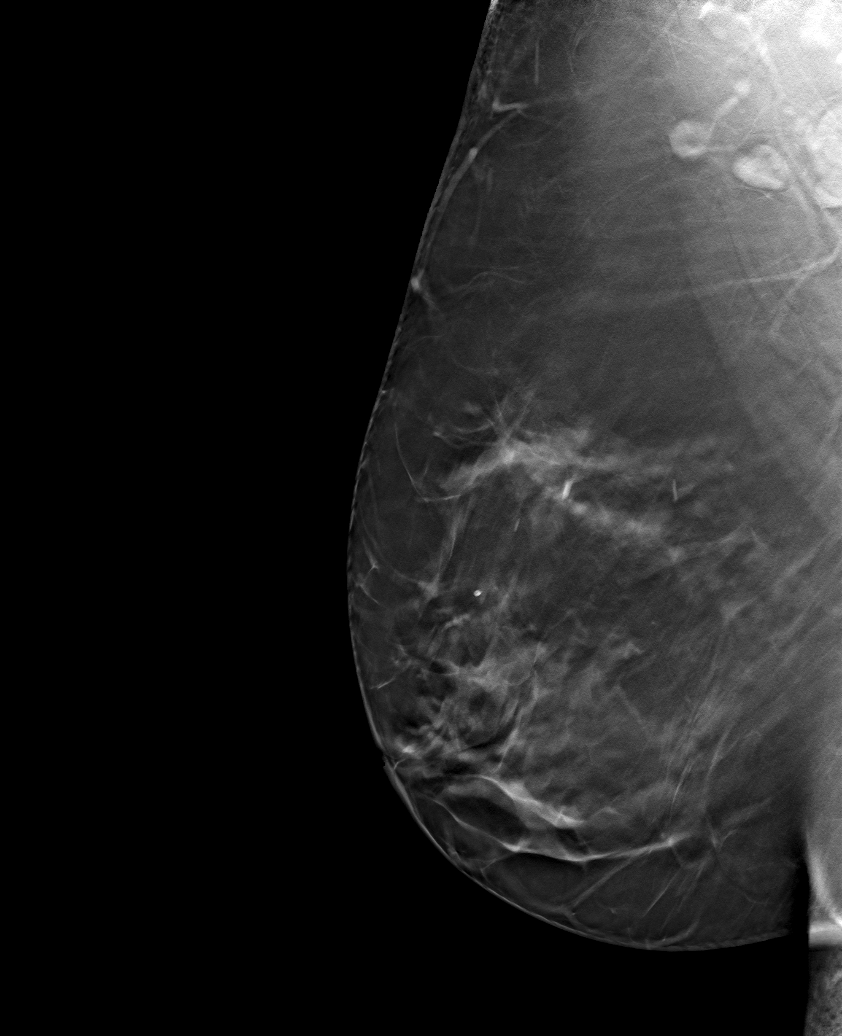

[6 of 30 positions shown; findings below may reference images not displayed]

ACR Breast Density Category b: There are scattered areas of
fibroglandular density.
FINDINGS: There are no findings suspicious for malignancy.
IMPRESSION: No mammographic evidence of malignancy. A result letter of this
screening mammogram will be mailed directly to the patient.

RECOMMENDATION:
Screening mammogram in one year. (Code:51-O-LD2)

BI-RADS CATEGORY  1: Negative.

## 2023-10-11 MED ORDER — WEGOVY 0.5 MG/0.5ML ~~LOC~~ SOAJ: 0.5000 mg | SUBCUTANEOUS | 0 refills | Status: DC

## 2023-10-11 MED ORDER — WEGOVY 0.25 MG/0.5ML ~~LOC~~ SOAJ: 0.2500 mg | SUBCUTANEOUS | 0 refills | Status: DC

## 2023-10-11 NOTE — Progress Notes (Signed)
 Established Patient Office Visit  Subjective  Patient ID: Kristen Spence, female    DOB: April 27, 1960  Age: 63 y.o. MRN: [id number]  No chief complaint on file.   HPI  Recently started Wegovy  for weight loss but they discovered a mass in her pelvis and with surgery planning had to stop the medication. Has been off for about 5 weeks.    Discussed the use of AI scribe software for clinical note transcription with the patient, who gave verbal consent to proceed.  History of Present Illness Kristen Spence is a 64 year old female who presents for follow-up after surgery for a pelvic mass.  Postoperative status following pelvic mass resection - Underwent surgical removal of a large pelvic mass with bilateral salpingo-oophorectomy - Pathology report negative for malignancy - Developed postoperative complications including infected umbilical incision and abdominal blisters - Incisions not fully healed; managing with soap and water  Medication management - Discontinued Uzedy prior to surgery due to gastrointestinal effects - Previously tolerated Uzedy 0.5 mg without significant side effects - Plans to restart Uzedy at a lower dose of 0.25 mg - No current medication side effects  Psychological stress and occupational concerns - Experiences significant occupational stress related to current job - Continues employment primarily for insurance benefits - Considering retirement or transition to a less stressful job by age 18, when eligible for Medicare  History of septic shock - Septic shock occurred two years ago, associated with a traumatic work-related experience  Gastrointestinal symptoms - No bowel issues      ROS    Objective:     BP (!) 162/85   Pulse 72   Ht 5' 1 (1.549 m)   Wt 232 lb (105.2 kg)   SpO2 98%   BMI 43.84 kg/m    Physical Exam Vitals and nursing note reviewed.  Constitutional:      Appearance: Normal appearance.  HENT:     Head:  Normocephalic and atraumatic.  Eyes:     Conjunctiva/sclera: Conjunctivae normal.  Cardiovascular:     Rate and Rhythm: Normal rate and regular rhythm.  Pulmonary:     Effort: Pulmonary effort is normal.     Breath sounds: Normal breath sounds.  Skin:    General: Skin is warm and dry.  Neurological:     Mental Status: She is alert.  Psychiatric:        Mood and Affect: Mood normal.      No results found for any visits on 10/11/23.    The 10-year ASCVD risk score (Arnett DK, et al., 2019) is: 8.4%    Assessment & Plan:   Problem List Items Addressed This Visit       Other   Encounter for weight management - Primary   Visit #: 2 Starting Weight: 233 lbs    Current weight: 232 lbs Previous weight:229 lbs Change in weight: up 3 lbs  Goal weight: 150 lbs  Dietary goals: continue to avoid soda and work on inc protein and veggies intake.  Exercise goals:Stay active, start doing some band exercises for strengthening. Medication: restart Wegovy  from starting dose. Will titrate monthly as tolerated.    Follow-up and referrals: 12 weeks      Relevant Medications   semaglutide -weight management (WEGOVY ) 0.25 MG/0.5ML SOAJ SQ injection   semaglutide -weight management (WEGOVY ) 0.5 MG/0.5ML SOAJ SQ injection (Start on 11/08/2023)   Assessment and Plan Assessment & Plan Postoperative wound healing after bilateral salpingo-oophorectomy Wound healing ongoing with infection noted. No  malignancy in pathology. - Advise Vaseline application to incisions, including umbilicus. - Continue cleaning with soap and water.  Obesity Restarting Uzedy post-surgery for weight loss. Expected to improve fatty liver and joint pain. Insurance covers [place] for the year. - Restart Wegovy  at 0.25 mg for four weeks, then increase to 0.5 mg for four weeks. - Monitor tolerance and effectiveness. - Advise protein intake with every meal. - Encourage light exercise with bands or light  weights.  Nonalcoholic fatty liver disease Weight loss expected to improve liver health. Uzedy to aid weight reduction. - Encourage weight loss through diet and exercise.  Anxiety related to work stress and prior trauma Anxiety linked to work stress and past trauma. Considering job change or retirement. Work-from-home beneficial. - Discuss job change or retirement options. - Continue work-from-home arrangement.    Return in about 3 months (around 01/10/2024), or Weight Mgt.    Dorothyann Byars, MD

## 2023-10-11 NOTE — Assessment & Plan Note (Signed)
 Visit #: 2 Starting Weight: 233 lbs    Current weight: 232 lbs Previous weight:229 lbs Change in weight: up 3 lbs  Goal weight: 150 lbs  Dietary goals: continue to avoid soda and work on inc protein and veggies intake.  Exercise goals:Stay active, start doing some band exercises for strengthening. Medication: restart Wegovy  from starting dose. Will titrate monthly as tolerated.    Follow-up and referrals: 12 weeks

## 2023-10-21 ENCOUNTER — Ambulatory Visit (INDEPENDENT_AMBULATORY_CARE_PROVIDER_SITE_OTHER)

## 2023-10-21 ENCOUNTER — Ambulatory Visit: Payer: Self-pay | Admitting: Family Medicine

## 2023-10-21 ENCOUNTER — Ambulatory Visit: Admitting: Family Medicine

## 2023-10-21 ENCOUNTER — Encounter: Payer: Self-pay | Admitting: Family Medicine

## 2023-10-21 VITALS — BP 131/67 | HR 75 | Temp 97.5°F | Ht 61.0 in | Wt 228.0 lb

## 2023-10-21 DIAGNOSIS — R0602 Shortness of breath: Secondary | ICD-10-CM

## 2023-10-21 DIAGNOSIS — R079 Chest pain, unspecified: Secondary | ICD-10-CM | POA: Diagnosis not present

## 2023-10-21 DIAGNOSIS — J019 Acute sinusitis, unspecified: Secondary | ICD-10-CM | POA: Diagnosis not present

## 2023-10-21 LAB — CBC WITH DIFFERENTIAL/PLATELET
Basophils Absolute: 0 x10E3/uL (ref 0.0–0.2)
Basos: 0 %
EOS (ABSOLUTE): 0.2 x10E3/uL (ref 0.0–0.4)
Eos: 3 %
Hematocrit: 46.6 % (ref 34.0–46.6)
Hemoglobin: 15.5 g/dL (ref 11.1–15.9)
Immature Granulocytes: 0 %
Lymphocytes Absolute: 2.2 x10E3/uL (ref 0.7–3.1)
Lymphs: 23 %
MCH: 29.8 pg (ref 26.6–33.0)
MCHC: 33.3 g/dL (ref 31.5–35.7)
MCV: 89 fL (ref 79–97)
Monocytes Absolute: 0.8 x10E3/uL (ref 0.1–0.9)
Monocytes: 8 %
Neutrophils Absolute: 6.3 x10E3/uL (ref 1.4–7.0)
Neutrophils: 66 %
Platelets: 328 x10E3/uL (ref 150–450)
RBC: 5.21 x10E6/uL (ref 3.77–5.28)
RDW: 13.2 % (ref 11.7–15.4)
WBC: 9.5 x10E3/uL (ref 3.4–10.8)

## 2023-10-21 LAB — D-DIMER, QUANTITATIVE: D-DIMER: 0.47 mg{FEU}/L (ref 0.00–0.49)

## 2023-10-21 MED ORDER — AMOXICILLIN 875 MG PO TABS: 875.0000 mg | ORAL_TABLET | Freq: Two times a day (BID) | ORAL | 0 refills | Status: DC

## 2023-10-21 NOTE — Progress Notes (Signed)
 Established Patient Office Visit  Subjective  Patient ID: Kristen Spence, female    DOB: August 12, 1960  Age: 63 y.o. MRN: [id number]  Chief Complaint  Patient presents with   Fatigue   Chest Pain    Onset yesterday lasting 30 min -- more of aching pain not radiating    HPI  Discussed the use of AI scribe software for clinical note transcription with the patient, who gave verbal consent to proceed.  History of Present Illness Kristen Spence is a 63 year old female who presents with chest pain and weakness following recent surgery.  Chest pain and discomfort - Chest pain localized to the left side, lasting approximately thirty minutes, occurred while cooking - No radiation of pain - Persistent chest tightness and discomfort since recent surgery - Burning and pulling sensations in the chest - No use of pain medication despite availability  Generalized weakness and fatigue - Increased weakness and excessive sleepiness since recent surgery - Initially attributed symptoms to the effects of the procedure - Surprised at the expectation to return to work soon after surgery  Respiratory symptoms - Nasal drainage and sinus congestion for approximately one week - Thick, unpleasant mucus - Persistent nasal congestion and raspy voice despite use of various medications - Onset of sweating over the past couple of days - Concern for possible infection  Dyspnea on exertion - Shortness of breath with activity since surgery - No shortness of breath at rest  Adverse reaction to opioid analgesia - Received Dilaudid  during surgery, resulting in significant nausea and feeling very sick - No use of pain medication postoperatively     ROS    Objective:     BP 131/67 (BP Location: Left Arm, Patient Position: Sitting, Cuff Size: Normal)   Pulse 75   Temp (!) 97.5 F (36.4 C) (Oral)   Ht 5' 1 (1.549 m)   Wt 228 lb (103.4 kg)   SpO2 97%   BMI 43.08 kg/m    Physical  Exam Vitals and nursing note reviewed.  Constitutional:      Appearance: Normal appearance.  HENT:     Head: Normocephalic and atraumatic.     Right Ear: Tympanic membrane, ear canal and external ear normal. There is no impacted cerumen.     Left Ear: Tympanic membrane, ear canal and external ear normal. There is no impacted cerumen.     Nose: Nose normal.     Mouth/Throat:     Pharynx: Oropharynx is clear.  Eyes:     Conjunctiva/sclera: Conjunctivae normal.  Cardiovascular:     Rate and Rhythm: Normal rate and regular rhythm.  Pulmonary:     Effort: Pulmonary effort is normal.     Breath sounds: Normal breath sounds.  Musculoskeletal:     Cervical back: Neck supple. No tenderness.  Lymphadenopathy:     Cervical: No cervical adenopathy.  Skin:    General: Skin is warm and dry.  Neurological:     Mental Status: She is alert and oriented to person, place, and time.  Psychiatric:        Mood and Affect: Mood normal.      Results for orders placed or performed in visit on 10/21/23  CBC with Differential/Platelet  Result Value Ref Range   WBC 9.5 3.4 - 10.8 x10E3/uL   RBC 5.21 3.77 - 5.28 x10E6/uL   Hemoglobin 15.5 11.1 - 15.9 g/dL   Hematocrit 53.3 65.9 - 46.6 %   MCV 89 79 - 97 fL  MCH 29.8 26.6 - 33.0 pg   MCHC 33.3 31.5 - 35.7 g/dL   RDW 86.7 88.2 - 84.5 %   Platelets 328 150 - 450 x10E3/uL   Neutrophils 66 Not Estab. %   Lymphs 23 Not Estab. %   Monocytes 8 Not Estab. %   Eos 3 Not Estab. %   Basos 0 Not Estab. %   Neutrophils Absolute 6.3 1.4 - 7.0 x10E3/uL   Lymphocytes Absolute 2.2 0.7 - 3.1 x10E3/uL   Monocytes Absolute 0.8 0.1 - 0.9 x10E3/uL   EOS (ABSOLUTE) 0.2 0.0 - 0.4 x10E3/uL   Basophils Absolute 0.0 0.0 - 0.2 x10E3/uL   Immature Granulocytes 0 Not Estab. %  D-dimer, quantitative  Result Value Ref Range   D-DIMER 0.47 0.00 - 0.49 mg/L FEU      The 10-year ASCVD risk score (Arnett DK, et al., 2019) is: 5.6%    Assessment & Plan:   Problem  List Items Addressed This Visit   None Visit Diagnoses       SOB (shortness of breath)    -  Primary   Relevant Orders   CBC with Differential/Platelet (Completed)   DG Chest 2 View   D-dimer, quantitative (Completed)     Left-sided chest pain       Relevant Orders   CBC with Differential/Platelet (Completed)   DG Chest 2 View   D-dimer, quantitative (Completed)   EKG 12-Lead     Acute non-recurrent sinusitis, unspecified location       Relevant Medications   amoxicillin  (AMOXIL ) 875 MG tablet       Assessment and Plan Assessment & Plan Postoperative left-sided chest pain and fatigue after bilateral salpingo-oophorectomy Intermittent left-sided chest pain and fatigue post-surgery, no significant EKG changes. Possible postoperative changes or sinus drainage contribution. - Perform EKG to assess cardiac function.  No change. - Compare EKG results with prior EKG from December 2022. - Will get labs to rule out electrolyte abnormality.  - Check stat D-dimer to rule out potential pulmonary embolism though I think less likely.  - Will get chest x-ray today just to rule out underlying infection or fluid.  Will call with results once available.   Acute infection with sinus drainage, nasal congestion, and hoarseness for one week. Thick mucus production noted. - Prescribe medication for sinus drainage if symptoms do not improve. - will go ahead and treat with amoxicillin  if not feeling better after 1 week please let us  know.  Continue symptomatic care.    EKG today shows.  EKG today shows rate of 70 bpm, normal sinus rhythm with left ventricular hypertrophy.  Unchanged compared to prior EKG from December 2022.   No follow-ups on file.    Dorothyann Byars, MD

## 2023-10-21 NOTE — Progress Notes (Signed)
 Great news!  No sign of blood clot in the chest.  Blood count is normal so no sign of infection or anemia as well.

## 2023-10-26 NOTE — Progress Notes (Signed)
 Hi Boyd, great news!  Chest x-ray looks normal.

## 2023-11-08 ENCOUNTER — Other Ambulatory Visit: Payer: Self-pay | Admitting: Family Medicine

## 2023-11-08 DIAGNOSIS — Z7689 Persons encountering health services in other specified circumstances: Secondary | ICD-10-CM

## 2023-11-10 ENCOUNTER — Other Ambulatory Visit: Payer: Self-pay | Admitting: Family Medicine

## 2023-11-10 DIAGNOSIS — Z7689 Persons encountering health services in other specified circumstances: Secondary | ICD-10-CM

## 2023-11-16 ENCOUNTER — Other Ambulatory Visit: Payer: Self-pay | Admitting: Family Medicine

## 2023-11-16 DIAGNOSIS — K2100 Gastro-esophageal reflux disease with esophagitis, without bleeding: Secondary | ICD-10-CM

## 2023-12-09 ENCOUNTER — Other Ambulatory Visit: Payer: Self-pay | Admitting: Family Medicine

## 2023-12-09 DIAGNOSIS — I10 Essential (primary) hypertension: Secondary | ICD-10-CM

## 2023-12-14 ENCOUNTER — Ambulatory Visit: Payer: Self-pay

## 2023-12-14 ENCOUNTER — Encounter: Payer: Self-pay | Admitting: Family Medicine

## 2023-12-14 NOTE — Telephone Encounter (Signed)
 FYI Only or Action Required?: Action required by provider: request for appointment. Pt was seen via VV last week and s/s are worsening. Pt would like to be seen today or tomorrow.   Patient was last seen in primary care on 10/21/2023 by Alvan Dorothyann BIRCH, MD.  Called Nurse Triage reporting Otalgia. Cough, sinus pressure, head pain and congestion  Symptoms began a week ago.  Interventions attempted: Other: had a vv visit. Given tessalon  pearls.  Symptoms are: gradually worsening.  Triage Disposition: See Physician Within 24 Hours  Patient/caregiver understands and will follow disposition?: No                       Copied from CRM #[id number]. Topic: Clinical - Red Word Triage >> Dec 14, 2023  2:06 PM Victoria B wrote: [facility] that prompted transfer to Nurse Triage: patient has severe pain in head and right ear, congestion, and sinus drainage Reason for Disposition  [1] Continuous (nonstop) coughing interferes with work or school AND [2] no improvement using cough treatment per Care Advice  Answer Assessment - Initial Assessment Questions 1. ONSET: When did the cough begin?      2 weeks 2. SEVERITY: How bad is the cough today?      moderate 3. SPUTUM: Describe the color of your sputum (e.g., none, dry cough; clear, white, yellow, green)     yellow 4. HEMOPTYSIS: Are you coughing up any blood? If Yes, ask: How much? (e.g., flecks, streaks, tablespoons, etc.)     no 5. DIFFICULTY BREATHING: Are you having difficulty breathing? If Yes, ask: How bad is it? (e.g., mild, moderate, severe)      Sore from cough 10. OTHER SYMPTOMS: Do you have any other symptoms? (e.g., runny nose, wheezing, chest pain)       Post nasal drip, head pain, congestion  Protocols used: Cough - Acute Non-Productive-A-AH

## 2023-12-14 NOTE — Telephone Encounter (Signed)
 Ok to double book with virtual at 11:30 tomorrowl

## 2023-12-15 ENCOUNTER — Telehealth (INDEPENDENT_AMBULATORY_CARE_PROVIDER_SITE_OTHER): Admitting: Family Medicine

## 2023-12-15 DIAGNOSIS — R052 Subacute cough: Secondary | ICD-10-CM | POA: Diagnosis not present

## 2023-12-15 DIAGNOSIS — J0100 Acute maxillary sinusitis, unspecified: Secondary | ICD-10-CM

## 2023-12-15 MED ORDER — HYDROCODONE BIT-HOMATROP MBR 5-1.5 MG/5ML PO SOLN: 5.0000 mL | Freq: Every evening | ORAL | 0 refills | Status: DC | PRN

## 2023-12-15 MED ORDER — AMOXICILLIN-POT CLAVULANATE 875-125 MG PO TABS: 1.0000 | ORAL_TABLET | Freq: Two times a day (BID) | ORAL | 0 refills | Status: DC

## 2023-12-15 MED ORDER — FLUCONAZOLE 150 MG PO TABS: 150.0000 mg | ORAL_TABLET | Freq: Once | ORAL | 1 refills | Status: AC

## 2023-12-15 NOTE — Telephone Encounter (Signed)
 Pt aware she is scheduled for an 1130 virtual per Dr. alvan

## 2023-12-15 NOTE — Progress Notes (Signed)
    Virtual Visit via Video Note  I connected with Kristen Spence on 12/15/23 at 11:30 AM EST by a video enabled telemedicine application and verified that I am speaking with the correct person using two identifiers.   I discussed the limitations of evaluation and management by telemedicine and the availability of in person appointments. The patient expressed understanding and agreed to proceed.  Patient location: at home Provider location: in office  Subjective:    CC:  No chief complaint on file.   HPI:  Pt reports that she has been sick x 2 week. She has tried sudafed and some other OTC medications.  She also did an E visit and was given Tessalon  pearls. East popping and cracking.  Coughing so hard her chest hurts.  Pain in frontal area adn maxillary. Feels like going down her ears.  Using nasal saline.     She states that she has post nasal drip no f/s/c. She did a covid test 1 week ago this was negative.Taking mucinex  and Sudafed.  + right ear pain.   Cough is keeping her awake.    Her mucus is yellow.     Past medical history, Surgical history, Family history not pertinant except as noted below, Social history, Allergies, and medications have been entered into the medical record, reviewed, and corrections made.    Objective:    General: Speaking clearly in complete sentences without any shortness of breath.  Alert and oriented x3.  Normal judgment. No apparent acute distress.    Impression and Recommendations:    Problem List Items Addressed This Visit   None Visit Diagnoses       Subacute cough    -  Primary   Relevant Medications   HYDROcodone  bit-homatropine (HYCODAN) 5-1.5 MG/5ML syrup     Acute non-recurrent maxillary sinusitis       Relevant Medications   amoxicillin -clavulanate (AUGMENTIN ) 875-125 MG tablet   HYDROcodone  bit-homatropine (HYCODAN) 5-1.5 MG/5ML syrup   fluconazole  (DIFLUCAN ) 150 MG tablet      Acute sinusitis - call if not better  into next week, Keep using the nasal saline.  Continue symptomatic care.   No orders of the defined types were placed in this encounter.   Meds ordered this encounter  Medications   amoxicillin -clavulanate (AUGMENTIN ) 875-125 MG tablet    Sig: Take 1 tablet by mouth 2 (two) times daily.    Dispense:  14 tablet    Refill:  0   HYDROcodone  bit-homatropine (HYCODAN) 5-1.5 MG/5ML syrup    Sig: Take 5 mLs by mouth at bedtime as needed for cough.    Dispense:  75 mL    Refill:  0   fluconazole  (DIFLUCAN ) 150 MG tablet    Sig: Take 1 tablet (150 mg total) by mouth once for 1 dose.    Dispense:  1 tablet    Refill:  1     I discussed the assessment and treatment plan with the patient. The patient was provided an opportunity to ask questions and all were answered. The patient agreed with the plan and demonstrated an understanding of the instructions.   The patient was advised to call back or seek an in-person evaluation if the symptoms worsen or if the condition fails to improve as anticipated.   Dorothyann Byars, MD

## 2023-12-15 NOTE — Progress Notes (Signed)
 Pt reports that she has been sick x 2 week. She has tried sudafed and some other OTC medications.  She also did an E visit and was given Tessalon  pearls.   She states that she has post nasal drip no f/s/c. She did a covid test 1 week ago this was negative.  Her mucus is yellow.

## 2024-01-11 ENCOUNTER — Other Ambulatory Visit: Payer: Self-pay | Admitting: Family Medicine

## 2024-01-11 DIAGNOSIS — F4381 Prolonged grief disorder: Secondary | ICD-10-CM

## 2024-01-11 NOTE — Telephone Encounter (Signed)
 Pt called to say please fill her medication if you can today, the holidays is really rough for her.

## 2024-01-12 DIAGNOSIS — N200 Calculus of kidney: Secondary | ICD-10-CM | POA: Diagnosis not present

## 2024-01-17 ENCOUNTER — Ambulatory Visit: Payer: Self-pay

## 2024-01-17 DIAGNOSIS — N200 Calculus of kidney: Secondary | ICD-10-CM

## 2024-01-17 DIAGNOSIS — R899 Unspecified abnormal finding in specimens from other organs, systems and tissues: Secondary | ICD-10-CM

## 2024-01-17 NOTE — Telephone Encounter (Signed)
 Patient called in stating last Wednesday 12/24 she was seen by Urology. She wants the provider to know her sodium is low, and calcium is high. She would like the provider to review the report from urology on 12/24.

## 2024-01-17 NOTE — Telephone Encounter (Signed)
 FYI Only or Action Required?: Action required by provider: update on patient condition.  Patient was last seen in primary care on 12/15/2023 by Alvan Dorothyann BIRCH, MD.  Called Nurse Triage reporting Labs Only.    Triage Disposition: Call PCP Within 24 Hours  Patient/caregiver understands and will follow disposition?: Yes      Copied from CRM #[id number]. Topic: General - Other >> Jan 17, 2024  4:14 PM Mercer PEDLAR wrote: Reason for CRM: Patient needs to relay results from kidney doctor. Reason for Disposition  [1] Follow-up call from patient regarding patient's clinical status AND [2] information NON-URGENT  Answer Assessment - Initial Assessment Questions 1. REASON FOR CALL: What is the main reason for your call? or How can I best help you?    Patient called in stating last Wednesday 12/24 she was seen by Urology, She wants the provider to know her sodium is low, and calcium is high. She would like the provider to review  the report from urology on 12/24.  Answer Assessment - Initial Assessment Questions 1. REASON FOR CALL or QUESTION: What is your reason for calling today? or How can I best     Patient called in stating last Wednesday 12/24 she was seen by Urology, She wants the provider to know her sodium is low, and calcium is high. She would like the provider to review  the report from urology on 12/24.  Protocols used: Information Only Call - No Triage-A-AH, PCP Call - No Triage-A-AH

## 2024-01-18 NOTE — Telephone Encounter (Signed)
 The labs are little bit odd I had recommend that she repeat them in about 2 weeks we can even do that here if she would like.

## 2024-01-19 DIAGNOSIS — N200 Calculus of kidney: Secondary | ICD-10-CM | POA: Diagnosis not present

## 2024-01-19 NOTE — Telephone Encounter (Signed)
 I also recommend that she go onto the [facility] website or even the [employer] there is some great information on a low purine diet.  Low purine helps reduce uric acid levels in the body.

## 2024-01-21 NOTE — Addendum Note (Signed)
 Addended by: Lena Fieldhouse P on: 01/21/2024 11:06 AM   Modules accepted: Orders

## 2024-01-21 NOTE — Telephone Encounter (Signed)
 Patient informed.

## 2024-01-29 ENCOUNTER — Other Ambulatory Visit: Payer: Self-pay | Admitting: Family Medicine

## 2024-01-29 DIAGNOSIS — F4381 Prolonged grief disorder: Secondary | ICD-10-CM

## 2024-02-01 ENCOUNTER — Other Ambulatory Visit: Payer: Self-pay | Admitting: Family Medicine

## 2024-02-01 DIAGNOSIS — N200 Calculus of kidney: Secondary | ICD-10-CM

## 2024-02-02 ENCOUNTER — Ambulatory Visit: Payer: Self-pay | Admitting: Family Medicine

## 2024-02-02 LAB — COMPREHENSIVE METABOLIC PANEL WITH GFR
ALT: 47 IU/L — ABNORMAL HIGH (ref 0–32)
AST: 44 IU/L — ABNORMAL HIGH (ref 0–40)
Albumin: 4.2 g/dL (ref 3.9–4.9)
Alkaline Phosphatase: 72 IU/L (ref 49–135)
BUN/Creatinine Ratio: 15 (ref 12–28)
BUN: 13 mg/dL (ref 8–27)
Bilirubin Total: 0.5 mg/dL (ref 0.0–1.2)
CO2: 22 mmol/L (ref 20–29)
Calcium: 10.3 mg/dL (ref 8.7–10.3)
Chloride: 104 mmol/L (ref 96–106)
Creatinine, Ser: 0.84 mg/dL (ref 0.57–1.00)
Globulin, Total: 2.9 g/dL (ref 1.5–4.5)
Glucose: 87 mg/dL (ref 70–99)
Potassium: 4.3 mmol/L (ref 3.5–5.2)
Sodium: 142 mmol/L (ref 134–144)
Total Protein: 7.1 g/dL (ref 6.0–8.5)
eGFR: 79 mL/min/1.73

## 2024-02-02 LAB — URIC ACID: Uric Acid: 7 mg/dL (ref 3.0–7.2)

## 2024-02-02 NOTE — Progress Notes (Signed)
 Hi Kristen Spence, kidney function is stable and looks good.  Calcium level is in the normal range.  The liver enzymes are still mildly elevated but they actually look better compared to last time so that is fantastic news they seem to be moving in the right direction.

## 2024-02-07 ENCOUNTER — Ambulatory Visit: Admitting: Family Medicine

## 2024-02-07 ENCOUNTER — Encounter: Payer: Self-pay | Admitting: Family Medicine

## 2024-02-07 VITALS — BP 133/84 | HR 67 | Ht 61.0 in | Wt 226.1 lb

## 2024-02-07 DIAGNOSIS — F4310 Post-traumatic stress disorder, unspecified: Secondary | ICD-10-CM | POA: Diagnosis not present

## 2024-02-07 DIAGNOSIS — F4001 Agoraphobia with panic disorder: Secondary | ICD-10-CM | POA: Diagnosis not present

## 2024-02-07 DIAGNOSIS — F4322 Adjustment disorder with anxiety: Secondary | ICD-10-CM

## 2024-02-07 DIAGNOSIS — Z7689 Persons encountering health services in other specified circumstances: Secondary | ICD-10-CM | POA: Diagnosis not present

## 2024-02-07 NOTE — Assessment & Plan Note (Signed)
 Visit #: 3 Starting Weight: 233 lbs    Current weight: 229 lbs Previous weight:226 lbs Change in weight: down 3 lbs  Goal weight: 150 lbs  Dietary goals: continue to avoid soda and work on inc protein and veggies intake.  Exercise goals:Stay active, start doing some band exercises for strengthening. Medication: restart Wegovy  from starting dose. Will titrate monthly as tolerated.    Follow-up and referrals: 12 weeks

## 2024-02-07 NOTE — Progress Notes (Signed)
 "  Established Patient Office Visit  Patient ID: Kristen Spence, female    DOB: Sep 09, 1960  Age: 64 y.o. MRN: [id number] PCP: Alvan Dorothyann BIRCH, MD  Chief Complaint  Patient presents with   Medical Management of Chronic Issues    Subjective:     HPI  Discussed the use of AI scribe software for clinical note transcription with the patient, who gave verbal consent to proceed.  History of Present Illness Kristen Spence is a 64 year old female who presents for follow-up on liver enzyme levels and dietary management.  Hepatic enzyme abnormalities - Liver enzymes have decreased since last visit: AST reduced from the 60s to 44, ALT from 56 to 47. - Focused on dietary management to improve liver function.  Dietary modifications - Eliminated soft drinks and added sugars. - Increased intake of fresh vegetables and grilled chicken. - Switched from vegetable oil to olive oil. - Consuming lemon juice with water daily. - Eating oatmeal and boiled eggs for breakfast. - Reduced intake of red meat.  Nephrolithiasis and electrolyte abnormalities - Experienced a kidney stone on January 12, 2024. - At that time, sodium was low and calcium was high. - Increased water intake significantly since the episode.  Obesity and weight management - Currently on Wegovy , increased dose from 0.25 to 0.5. - Weight decreased from 231 lbs in December 2025 to 226 lbs. - Incorporating walking into daily routine.  Anxiety and post-traumatic stress symptoms - Significant anxiety and PTSD symptoms related to past medical experiences, including traumatic hospitalization for septic shock and loss of husband to COVID-19. - Avoids crowded places and feels nervous about returning to work in a building setting. - Experiences nightmares and anxiety when reminded of these events.  Hyperlipidemia - History of high cholesterol. - Recent cholesterol levels within normal range despite not fasting before the  test. - Attributes improvement to dietary changes.     ROS    Objective:     BP 133/84   Pulse 67   Ht 5' 1 (1.549 m)   Wt 226 lb 1.3 oz (102.5 kg)   SpO2 97%   BMI 42.72 kg/m    Physical Exam Vitals and nursing note reviewed.  Constitutional:      Appearance: Normal appearance.  HENT:     Head: Normocephalic and atraumatic.  Eyes:     Conjunctiva/sclera: Conjunctivae normal.  Cardiovascular:     Rate and Rhythm: Normal rate and regular rhythm.  Pulmonary:     Effort: Pulmonary effort is normal.     Breath sounds: Normal breath sounds.  Skin:    General: Skin is warm and dry.  Neurological:     Mental Status: She is alert.  Psychiatric:        Mood and Affect: Mood normal.      No results found for any visits on 02/07/24.    The 10-year ASCVD risk score (Arnett DK, et al., 2019) is: 6.3%    Assessment & Plan:   Problem List Items Addressed This Visit       Other   PTSD (post-traumatic stress disorder)   Post-traumatic stress disorder Experiencing anxiety and fear related to past trauma, including COVID-19 and hospitalizations. Symptoms include nightmares and anxiety. - Consider therapy for PTSD management. - Therapist had encouraged participation in support groups, but she was very uncomfortable.  - encouraged to continue to work from home so that she can do her job with out constant high anxiety which could impact her  ability to work.       Panic disorder with agoraphobia   Morbid obesity (HCC)   Encounter for weight management   Visit #: 3 Starting Weight: 233 lbs    Current weight: 229 lbs Previous weight:226 lbs Change in weight: down 3 lbs  Goal weight: 150 lbs  Dietary goals: continue to avoid soda and work on inc protein and veggies intake.  Exercise goals:Stay active, start doing some band exercises for strengthening. Medication: restart Wegovy  from starting dose. Will titrate monthly as tolerated.    Follow-up and referrals: 12  weeks      Adjustment disorder with anxiety - Primary    Assessment and Plan Assessment & Plan Fatty liver disease Liver enzymes decreased but remain elevated. AST decreased from 60 to 44, ALT from 56 to 47. Improvement noted. Dietary changes and weight loss observed. Wegovy  may aid liver health. - Continue dietary modifications focusing on reducing sugar and processed foods. - Continue Wegovy  for weight management. - Encouraged regular physical activity, starting with short walks or indoor exercises. - Consider incorporating lentils into diet for protein and fiber.  Morbid Obesity/BMI 42 Weight decreased from 231 lbs to 226 lbs. Wegovy  used for weight management. Dietary changes implemented. Motivated to improve health through weight loss. - Continue Wegovy  for weight management. - Encouraged dietary modifications focusing on reducing sugar and processed foods. - Encouraged regular physical activity, starting with short walks or indoor exercises.  Nephrolithiasis Recent kidney stone episode in December. Sodium levels low, calcium levels high, possibly due to dehydration. Hydration status improved. - Continue adequate hydration to prevent future kidney stones.   No follow-ups on file.    Dorothyann Byars, MD [facility]   "

## 2024-02-07 NOTE — Assessment & Plan Note (Signed)
 Post-traumatic stress disorder Experiencing anxiety and fear related to past trauma, including COVID-19 and hospitalizations. Symptoms include nightmares and anxiety. - Consider therapy for PTSD management. - Therapist had encouraged participation in support groups, but she was very uncomfortable.  - encouraged to continue to work from home so that she can do her job with out constant high anxiety which could impact her ability to work.

## 2024-02-14 ENCOUNTER — Telehealth: Payer: Self-pay | Admitting: Family Medicine

## 2024-02-14 NOTE — Telephone Encounter (Signed)
 Paperork completed and placed in [place] B basket 02/15/24. Pt will need to sign front

## 2024-02-16 NOTE — Telephone Encounter (Signed)
 Form signed by pt, faxed, confirmation received, and scanned into chart.

## 2024-02-21 MED ORDER — WEGOVY 1 MG/0.5ML ~~LOC~~ SOAJ: 1.0000 mg | SUBCUTANEOUS | 0 refills | Status: AC

## 2024-02-25 ENCOUNTER — Telehealth: Payer: Self-pay | Admitting: *Deleted

## 2024-02-25 NOTE — Telephone Encounter (Signed)
Called and spoke w pt .Marland KitchenMarland Kitchen

## 2024-02-25 NOTE — Telephone Encounter (Signed)
 Copied from CRM #[id number]. Topic: General - Other >> Feb 23, 2024  3:53 PM Nathanel BROCKS wrote: Reason for CRM: Nautia Lem please give pt a call she said its urgent. [phone]
